# Patient Record
Sex: Female | Born: 1975 | Race: White | Hispanic: No | Marital: Married | Smoking: Former smoker
Health system: Southern US, Community
[De-identification: ages and names within clinical notes are randomized; demographics above are authoritative.]

## PROBLEM LIST (undated history)

## (undated) DIAGNOSIS — Z954 Presence of other heart-valve replacement: Secondary | ICD-10-CM

## (undated) DIAGNOSIS — R112 Nausea with vomiting, unspecified: Secondary | ICD-10-CM

## (undated) DIAGNOSIS — F32A Depression, unspecified: Secondary | ICD-10-CM

## (undated) DIAGNOSIS — I071 Rheumatic tricuspid insufficiency: Secondary | ICD-10-CM

## (undated) DIAGNOSIS — Z87442 Personal history of urinary calculi: Secondary | ICD-10-CM

## (undated) DIAGNOSIS — I509 Heart failure, unspecified: Secondary | ICD-10-CM

## (undated) DIAGNOSIS — R0600 Dyspnea, unspecified: Secondary | ICD-10-CM

## (undated) DIAGNOSIS — I5032 Chronic diastolic (congestive) heart failure: Secondary | ICD-10-CM

## (undated) DIAGNOSIS — D649 Anemia, unspecified: Secondary | ICD-10-CM

## (undated) DIAGNOSIS — Z9889 Other specified postprocedural states: Secondary | ICD-10-CM

## (undated) DIAGNOSIS — F329 Major depressive disorder, single episode, unspecified: Secondary | ICD-10-CM

## (undated) DIAGNOSIS — R072 Precordial pain: Secondary | ICD-10-CM

## (undated) DIAGNOSIS — I052 Rheumatic mitral stenosis with insufficiency: Secondary | ICD-10-CM

## (undated) DIAGNOSIS — J810 Acute pulmonary edema: Secondary | ICD-10-CM

## (undated) DIAGNOSIS — F410 Panic disorder [episodic paroxysmal anxiety] without agoraphobia: Secondary | ICD-10-CM

## (undated) DIAGNOSIS — I499 Cardiac arrhythmia, unspecified: Secondary | ICD-10-CM

## (undated) HISTORY — DX: Precordial pain: R07.2

## (undated) HISTORY — DX: Rheumatic tricuspid insufficiency: I07.1

## (undated) HISTORY — PX: SPLENIC ARTERY EMBOLIZATION: SHX2430

## (undated) HISTORY — DX: Anemia, unspecified: D64.9

## (undated) HISTORY — DX: Chronic diastolic (congestive) heart failure: I50.32

## (undated) HISTORY — DX: Panic disorder (episodic paroxysmal anxiety): F41.0

## (undated) HISTORY — PX: TONSILLECTOMY: SUR1361

## (undated) HISTORY — DX: Acute pulmonary edema: J81.0

## (undated) HISTORY — DX: Rheumatic mitral stenosis with insufficiency: I05.2

## (undated) HISTORY — PX: APPENDECTOMY: SHX54

---

## 2002-08-23 ENCOUNTER — Encounter: Payer: Self-pay | Admitting: Internal Medicine

## 2013-06-28 HISTORY — PX: MULTIPLE TOOTH EXTRACTIONS: SHX2053

## 2014-05-22 DIAGNOSIS — N200 Calculus of kidney: Secondary | ICD-10-CM | POA: Insufficient documentation

## 2014-05-22 DIAGNOSIS — R300 Dysuria: Secondary | ICD-10-CM | POA: Insufficient documentation

## 2014-05-22 DIAGNOSIS — R109 Unspecified abdominal pain: Secondary | ICD-10-CM | POA: Insufficient documentation

## 2014-06-28 HISTORY — PX: OTHER SURGICAL HISTORY: SHX169

## 2017-10-21 ENCOUNTER — Encounter: Payer: Self-pay | Admitting: *Deleted

## 2017-10-24 ENCOUNTER — Encounter: Payer: Self-pay | Admitting: Cardiology

## 2017-10-24 ENCOUNTER — Ambulatory Visit (INDEPENDENT_AMBULATORY_CARE_PROVIDER_SITE_OTHER): Payer: Medicaid Other | Admitting: Cardiology

## 2017-10-24 VITALS — BP 112/77 | HR 69 | Ht 68.0 in | Wt 237.2 lb

## 2017-10-24 DIAGNOSIS — I501 Left ventricular failure: Secondary | ICD-10-CM

## 2017-10-24 DIAGNOSIS — R0602 Shortness of breath: Secondary | ICD-10-CM | POA: Diagnosis not present

## 2017-10-24 DIAGNOSIS — I050 Rheumatic mitral stenosis: Secondary | ICD-10-CM

## 2017-10-24 MED ORDER — FUROSEMIDE 40 MG PO TABS: 60.0000 mg | ORAL_TABLET | Freq: Every day | ORAL | 3 refills | Status: DC

## 2017-10-24 NOTE — Patient Instructions (Addendum)
Medication Instructions:  Your physician has recommended you make the following change in your medication:    INCREASE Lasix to 60 mg daily   Please continue all other medications as prescribed  Labwork:  BMET, MAG  In 1 week  Orders given today   Testing/Procedures: NONE  Follow-Up: Your physician recommends that you schedule a follow-up appointment PENDING RESULTS   Any Other Special Instructions Will Be Listed Below (If Applicable).  If you need a refill on your cardiac medications before your next appointment, please call your pharmacy.

## 2017-10-24 NOTE — Progress Notes (Addendum)
Clinical Summary Ms. Manwarren is a 42 y.o.female seen as new consult, referred by Dr Neita Carp for shortness of breath and pulmonary edema.   1. Pulmonary edema/SOB - admitted 08/2017 to [facility] with SOB.  - CT PE negative, noted to have pulmonary edema - 08/2017 echo [facility]: LVEF 60-65%, mild LVH, mod LAE, Rheumatic MV appearance, severe mitral stenosis   - repeat ER visit with SOB. Given IV lasix in ER - recurrent SOB over the last few days. +orthopnea x 1 month - can have some abdominal distension, LE edema at times - home weights variable.   2. Mitral stenosis - 08/2017 echo [facility]: rheumatic MV described, severe MS. Mean gradient 15. MVA by PHT 1.6. HR 84 during study.  - recent symptoms as reported above   3. Pulmonary nodule - followed by pcp    Past Medical History:  Diagnosis Date  . Acute pulmonary edema (HCC)   . Panic disorder without agoraphobia   . Precordial pain   . Rheumatic mitral stenosis      No Known Allergies   Current Outpatient Medications  Medication Sig Dispense Refill  . albuterol (PROAIR HFA) 108 (90 Base) MCG/ACT inhaler Inhale into the lungs every 6 (six) hours as needed for wheezing or shortness of breath.    . ALPRAZolam (XANAX) 0.5 MG tablet Take 0.5 mg by mouth 4 (four) times daily as needed for anxiety.     . fluticasone (FLONASE) 50 MCG/ACT nasal spray Place into both nostrils daily.    . sertraline (ZOLOFT) 100 MG tablet Take 200 mg by mouth daily.     . traZODone (DESYREL) 100 MG tablet Take 200 mg by mouth at bedtime.     . furosemide (LASIX) 40 MG tablet Take 1.5 tablets (60 mg total) by mouth daily. 45 tablet 3   No current facility-administered medications for this visit.         No Known Allergies    Family History  Problem Relation Age of Onset  . Cancer Father   . Dementia Maternal Grandmother   . Heart disease Maternal Grandfather        had open heart problems  . Cancer Paternal Grandmother   .  Cancer Paternal Grandfather      Social History Ms. Brickhouse reports that she quit smoking about 5 weeks ago. Her smoking use included cigarettes. She started smoking about 25 years ago. She has a 13.00 pack-year smoking history. She has never used smokeless tobacco. Ms. Talwar has no alcohol history on file.   Review of Systems CONSTITUTIONAL: No weight loss, fever, chills, weakness or fatigue.  HEENT: Eyes: No visual loss, blurred vision, double vision or yellow sclerae.No hearing loss, sneezing, congestion, runny nose or sore throat.  SKIN: No rash or itching.  CARDIOVASCULAR: per hpi RESPIRATORY: per hpi GASTROINTESTINAL: No anorexia, nausea, vomiting or diarrhea. No abdominal pain or blood.  GENITOURINARY: No burning on urination, no polyuria NEUROLOGICAL: No headache, dizziness, syncope, paralysis, ataxia, numbness or tingling in the extremities. No change in bowel or bladder control.  MUSCULOSKELETAL: No muscle, back pain, joint pain or stiffness.  LYMPHATICS: No enlarged nodes. No history of splenectomy.  PSYCHIATRIC: No history of depression or anxiety.  ENDOCRINOLOGIC: No reports of sweating, cold or heat intolerance. No polyuria or polydipsia.  Marland Kitchen   Physical Examination Vitals:   10/24/17 0825 10/24/17 0833  BP: 109/74 112/77  Pulse: 65 69  SpO2: 100% 100%   Filed Weights  10/24/17 0825  Weight: 237 lb 3.2 oz (107.6 kg)    Gen: resting comfortably, no acute distress HEENT: no scleral icterus, pupils equal round and reactive, no palptable cervical adenopathy,  CV:RRR, 2/6 diastolic and systolic murmur apex, no jvd Resp: Clear to auscultation bilaterally GI: abdomen is soft, non-tender, non-distended, normal bowel sounds, no hepatosplenomegaly MSK: extremities are warm, no edema.  Skin: warm, no rash Neuro:  no focal deficits Psych: appropriate affect      Assessment and Plan  1. Mitral stenosis/Pulmonary edema/SOB - echo done at [facility]. Disc  obtained, images reviewed by me independtly. Unfortunately the Doppler CW and PW spectral waveforms are not included and I cannot measure gradients directly. There is clear rheumatic changes to the MV with turbulent flow across the valve, minimal calcfication. At least mild MR, if not more. Reported mean gradient is 15.  - probable severe MS, she is significantly symptomatic - plan for TEE to further evaluate valve, to further evaluate her MS but also  quantify her MR which may affect her candidacy for valvuloplasty. Will arrange RHC/LHC as well. - increase lasix to 60mg  daily, check BMET/Mg I 1 week  - pending further workup results, consider with referally to CT surgery for MVR vs interventional cardiology for MV valvuloplasty.  F/u pending test results   Extended clinical time due to review of extensive hospital records including clinic notes, imagaing studies, labs, echo report, and individual review of echo images.    11/01/17 Addendum Called patient today, discussed TEE and cath in details. Of note patient with mirena birth control, I have not ordered a urine prengancy test.   I have reviewed the risks, indications, and alternatives to cardiac catheterization, possible angioplasty, and stenting with the patient  today. Risks include but are not limited to bleeding, infection, vascular injury, stroke, myocardial infection, arrhythmia, kidney injury, radiation-related injury in the case of prolonged fluoroscopy use, emergency cardiac surgery, and death. The patient understands the risks of serious complication is 1-2 in 1000 with diagnostic cardiac cath and 1-2% or less with angioplasty/stenting.   Antoine Poche, M.D

## 2017-10-29 ENCOUNTER — Encounter: Payer: Self-pay | Admitting: Cardiology

## 2017-10-31 ENCOUNTER — Telehealth: Payer: Self-pay | Admitting: *Deleted

## 2017-10-31 NOTE — Telephone Encounter (Signed)
-----   Message from Antoine Poche, MD sent at 10/27/2017 11:41 AM EDT ----- Please let patient know we received the echo disc from [facility]. We need to obtain more information about her heart and heart valve to decide next step Please arrange a TEE and a RHC/LHC all to be done same day at [facility] for mitral stenosis.   Dina Rich MD

## 2017-10-31 NOTE — Telephone Encounter (Signed)
Scheduled TEE for 8am 5/9 left and right heart cath scheduled for 1:30pm - pt had labs done today at [facility] (will request)  Case # for TEE [id number] - pt voiced understanding of instructions.

## 2017-11-01 ENCOUNTER — Other Ambulatory Visit: Payer: Self-pay | Admitting: Cardiology

## 2017-11-01 DIAGNOSIS — I050 Rheumatic mitral stenosis: Secondary | ICD-10-CM

## 2017-11-02 ENCOUNTER — Encounter: Payer: Self-pay | Admitting: *Deleted

## 2017-11-03 ENCOUNTER — Encounter (HOSPITAL_COMMUNITY): Payer: Self-pay | Admitting: *Deleted

## 2017-11-03 ENCOUNTER — Ambulatory Visit (HOSPITAL_COMMUNITY)
Admission: RE | Admit: 2017-11-03 | Discharge: 2017-11-03 | Disposition: A | Discharge status: Home / Self Care | Payer: Medicaid Other | Source: Ambulatory Visit | Attending: Cardiology | Admitting: Cardiology

## 2017-11-03 ENCOUNTER — Encounter (HOSPITAL_COMMUNITY)
Admission: RE | Disposition: A | Discharge status: Home / Self Care | Payer: Self-pay | Source: Ambulatory Visit | Attending: Cardiology

## 2017-11-03 ENCOUNTER — Ambulatory Visit (HOSPITAL_BASED_OUTPATIENT_CLINIC_OR_DEPARTMENT_OTHER)
Admission: RE | Admit: 2017-11-03 | Discharge: 2017-11-03 | Disposition: A | Discharge status: Home / Self Care | Payer: Medicaid Other | Source: Ambulatory Visit | Attending: Cardiology | Admitting: Cardiology

## 2017-11-03 DIAGNOSIS — J811 Chronic pulmonary edema: Secondary | ICD-10-CM | POA: Diagnosis not present

## 2017-11-03 DIAGNOSIS — Z7951 Long term (current) use of inhaled steroids: Secondary | ICD-10-CM | POA: Diagnosis not present

## 2017-11-03 DIAGNOSIS — F410 Panic disorder [episodic paroxysmal anxiety] without agoraphobia: Secondary | ICD-10-CM | POA: Insufficient documentation

## 2017-11-03 DIAGNOSIS — Z87891 Personal history of nicotine dependence: Secondary | ICD-10-CM | POA: Diagnosis not present

## 2017-11-03 DIAGNOSIS — R911 Solitary pulmonary nodule: Secondary | ICD-10-CM | POA: Diagnosis not present

## 2017-11-03 DIAGNOSIS — I340 Nonrheumatic mitral (valve) insufficiency: Secondary | ICD-10-CM

## 2017-11-03 DIAGNOSIS — I050 Rheumatic mitral stenosis: Secondary | ICD-10-CM | POA: Diagnosis not present

## 2017-11-03 DIAGNOSIS — I052 Rheumatic mitral stenosis with insufficiency: Secondary | ICD-10-CM

## 2017-11-03 HISTORY — PX: CARDIAC CATHETERIZATION: SHX172

## 2017-11-03 HISTORY — PX: TEE WITHOUT CARDIOVERSION: SHX5443

## 2017-11-03 HISTORY — PX: RIGHT AND LEFT HEART CATH: CATH118262

## 2017-11-03 LAB — POCT I-STAT 3, ART BLOOD GAS (G3+)
Acid-base deficit: 6 mmol/L — ABNORMAL HIGH (ref 0.0–2.0)
Bicarbonate: 18.1 mmol/L — ABNORMAL LOW (ref 20.0–28.0)
O2 SAT: 98 %
PCO2 ART: 30.8 mmHg — AB (ref 32.0–48.0)
PH ART: 7.379 (ref 7.350–7.450)
PO2 ART: 100 mmHg (ref 83.0–108.0)
TCO2: 19 mmol/L — ABNORMAL LOW (ref 22–32)

## 2017-11-03 LAB — BASIC METABOLIC PANEL
Anion gap: 6 (ref 5–15)
BUN: 16 mg/dL (ref 6–20)
CHLORIDE: 111 mmol/L (ref 101–111)
CO2: 24 mmol/L (ref 22–32)
Calcium: 8.7 mg/dL — ABNORMAL LOW (ref 8.9–10.3)
Creatinine, Ser: 0.97 mg/dL (ref 0.44–1.00)
GFR calc Af Amer: >60 mL/min (ref >60)
GFR calc non Af Amer: >60 mL/min (ref >60)
GLUCOSE: 99 mg/dL (ref 65–99)
POTASSIUM: 4.6 mmol/L (ref 3.5–5.1)
Sodium: 141 mmol/L (ref 135–145)

## 2017-11-03 LAB — CBC
HEMATOCRIT: 35.6 % — AB (ref 36.0–46.0)
Hemoglobin: 11.3 g/dL — ABNORMAL LOW (ref 12.0–15.0)
MCH: 27.1 pg (ref 26.0–34.0)
MCHC: 31.7 g/dL (ref 30.0–36.0)
MCV: 85.4 fL (ref 78.0–100.0)
Platelets: 162 10*3/uL (ref 150–400)
RBC: 4.17 MIL/uL (ref 3.87–5.11)
RDW: 14.1 % (ref 11.5–15.5)
WBC: 4.7 10*3/uL (ref 4.0–10.5)

## 2017-11-03 LAB — POCT I-STAT 3, VENOUS BLOOD GAS (G3P V)
Acid-base deficit: 5 mmol/L — ABNORMAL HIGH (ref 0.0–2.0)
BICARBONATE: 20 mmol/L (ref 20.0–28.0)
O2 Saturation: 70 %
PH VEN: 7.359 (ref 7.250–7.430)
PO2 VEN: 38 mmHg (ref 32.0–45.0)
TCO2: 21 mmol/L — AB (ref 22–32)
pCO2, Ven: 35.4 mmHg — ABNORMAL LOW (ref 44.0–60.0)

## 2017-11-03 LAB — PROTIME-INR
INR: 1.05
Prothrombin Time: 13.6 seconds (ref 11.4–15.2)

## 2017-11-03 LAB — PREGNANCY, URINE: PREG TEST UR: NEGATIVE

## 2017-11-03 SURGERY — RIGHT AND LEFT HEART CATH

## 2017-11-03 SURGERY — ECHOCARDIOGRAM, TRANSESOPHAGEAL
Anesthesia: Moderate Sedation

## 2017-11-03 MED ORDER — FENTANYL CITRATE (PF) 100 MCG/2ML IJ SOLN
INTRAMUSCULAR | Status: DC | PRN
  Administered 2017-11-03 (×4): 25 ug via INTRAVENOUS

## 2017-11-03 MED ORDER — FENTANYL CITRATE (PF) 100 MCG/2ML IJ SOLN
INTRAMUSCULAR | Status: DC | PRN
  Administered 2017-11-03: 50 ug via INTRAVENOUS

## 2017-11-03 MED ORDER — SODIUM CHLORIDE 0.9 % IV SOLN: 250.0000 mL | INTRAVENOUS | Status: DC | PRN

## 2017-11-03 MED ORDER — SODIUM CHLORIDE 0.9 % WEIGHT BASED INFUSION: 1.0000 mL/kg/h | INTRAVENOUS | Status: DC

## 2017-11-03 MED ORDER — HEPARIN (PORCINE) IN NACL 1000-0.9 UT/500ML-% IV SOLN
INTRAVENOUS | Status: AC
  Filled 2017-11-03: qty 1000

## 2017-11-03 MED ORDER — VERAPAMIL HCL 2.5 MG/ML IV SOLN
INTRAVENOUS | Status: DC | PRN
  Administered 2017-11-03: 10 mL via INTRA_ARTERIAL

## 2017-11-03 MED ORDER — DIPHENHYDRAMINE HCL 50 MG/ML IJ SOLN
INTRAMUSCULAR | Status: AC
  Filled 2017-11-03: qty 1

## 2017-11-03 MED ORDER — FENTANYL CITRATE (PF) 100 MCG/2ML IJ SOLN
INTRAMUSCULAR | Status: AC
  Filled 2017-11-03: qty 2

## 2017-11-03 MED ORDER — LIDOCAINE HCL (PF) 1 % IJ SOLN
INTRAMUSCULAR | Status: DC | PRN
  Administered 2017-11-03 (×2): 2 mL

## 2017-11-03 MED ORDER — MIDAZOLAM HCL 2 MG/2ML IJ SOLN
INTRAMUSCULAR | Status: AC
  Filled 2017-11-03: qty 2

## 2017-11-03 MED ORDER — SODIUM CHLORIDE 0.9% FLUSH: 3.0000 mL | INTRAVENOUS | Status: DC | PRN

## 2017-11-03 MED ORDER — IODIXANOL 320 MG/ML IV SOLN
INTRAVENOUS | Status: DC | PRN
  Administered 2017-11-03: 70 mL via INTRAVENOUS

## 2017-11-03 MED ORDER — HEPARIN (PORCINE) IN NACL 2-0.9 UNITS/ML
INTRAMUSCULAR | Status: AC | PRN
  Administered 2017-11-03 (×2): 500 mL

## 2017-11-03 MED ORDER — ACETAMINOPHEN 325 MG PO TABS: 650.0000 mg | ORAL_TABLET | ORAL | Status: DC | PRN

## 2017-11-03 MED ORDER — VERAPAMIL HCL 2.5 MG/ML IV SOLN
INTRAVENOUS | Status: AC
  Filled 2017-11-03: qty 2

## 2017-11-03 MED ORDER — HEPARIN SODIUM (PORCINE) 1000 UNIT/ML IJ SOLN
INTRAMUSCULAR | Status: DC | PRN
  Administered 2017-11-03: 5000 [IU] via INTRAVENOUS

## 2017-11-03 MED ORDER — SODIUM CHLORIDE 0.9 % WEIGHT BASED INFUSION: 3.0000 mL/kg/h | INTRAVENOUS | Status: AC

## 2017-11-03 MED ORDER — DIPHENHYDRAMINE HCL 50 MG/ML IJ SOLN
INTRAMUSCULAR | Status: DC | PRN
  Administered 2017-11-03: 25 mg via INTRAVENOUS

## 2017-11-03 MED ORDER — HEPARIN SODIUM (PORCINE) 1000 UNIT/ML IJ SOLN
INTRAMUSCULAR | Status: AC
  Filled 2017-11-03: qty 1

## 2017-11-03 MED ORDER — SODIUM CHLORIDE 0.9% FLUSH: 3.0000 mL | Freq: Two times a day (BID) | INTRAVENOUS | Status: DC

## 2017-11-03 MED ORDER — ONDANSETRON HCL 4 MG/2ML IJ SOLN: 4.0000 mg | Freq: Four times a day (QID) | INTRAMUSCULAR | Status: DC | PRN

## 2017-11-03 MED ORDER — SODIUM CHLORIDE 0.9 % IV SOLN: INTRAVENOUS | Status: DC

## 2017-11-03 MED ORDER — ASPIRIN 81 MG PO CHEW
81.0000 mg | CHEWABLE_TABLET | ORAL | Status: AC
  Administered 2017-11-03: 81 mg via ORAL
  Filled 2017-11-03: qty 1

## 2017-11-03 MED ORDER — BUTAMBEN-TETRACAINE-BENZOCAINE 2-2-14 % EX AERO
INHALATION_SPRAY | CUTANEOUS | Status: DC | PRN
  Administered 2017-11-03: 2 via TOPICAL

## 2017-11-03 MED ORDER — SODIUM CHLORIDE 0.9 % IV SOLN: INTRAVENOUS | Status: AC

## 2017-11-03 MED ORDER — MIDAZOLAM HCL 2 MG/2ML IJ SOLN
INTRAMUSCULAR | Status: DC | PRN
  Administered 2017-11-03 (×2): 1 mg via INTRAVENOUS

## 2017-11-03 MED ORDER — MIDAZOLAM HCL 5 MG/ML IJ SOLN
INTRAMUSCULAR | Status: AC
  Filled 2017-11-03: qty 2

## 2017-11-03 MED ORDER — LIDOCAINE HCL (PF) 1 % IJ SOLN
INTRAMUSCULAR | Status: AC
  Filled 2017-11-03: qty 30

## 2017-11-03 MED ORDER — MIDAZOLAM HCL 10 MG/2ML IJ SOLN
INTRAMUSCULAR | Status: DC | PRN
  Administered 2017-11-03 (×4): 2 mg via INTRAVENOUS

## 2017-11-03 SURGICAL SUPPLY — 15 items
CATH BALLN WEDGE 5F 110CM (CATHETERS) ×2 IMPLANT
CATH INFINITI 5 FR JL3.5 (CATHETERS) ×2 IMPLANT
CATH INFINITI JR4 5F (CATHETERS) ×2 IMPLANT
DEVICE RAD COMP TR BAND LRG (VASCULAR PRODUCTS) ×2 IMPLANT
GUIDEWIRE INQWIRE 1.5J.035X260 (WIRE) IMPLANT
INQWIRE 1.5J .035X260CM (WIRE) ×3
KIT HEART LEFT (KITS) ×3 IMPLANT
NDL PERC 21GX4CM (NEEDLE) IMPLANT
NEEDLE PERC 21GX4CM (NEEDLE) ×3 IMPLANT
PACK CARDIAC CATHETERIZATION (CUSTOM PROCEDURE TRAY) ×3 IMPLANT
SHEATH AVANTI 11CM 5FR (SHEATH) IMPLANT
SHEATH RAIN 4/5FR (SHEATH) ×2 IMPLANT
SHEATH RAIN RADIAL 21G 6FR (SHEATH) ×2 IMPLANT
TRANSDUCER W/STOPCOCK (MISCELLANEOUS) ×3 IMPLANT
TUBING CIL FLEX 10 FLL-RA (TUBING) ×3 IMPLANT

## 2017-11-03 NOTE — Discharge Instructions (Signed)

## 2017-11-03 NOTE — Interval H&P Note (Signed)
History and Physical Interval Note:  11/03/2017 7:49 AM  Gloria James  has presented today for surgery, with the diagnosis of MITRAL STENOSIS  The various methods of treatment have been discussed with the patient and family. After consideration of risks, benefits and other options for treatment, the patient has consented to  Procedure(s): TRANSESOPHAGEAL ECHOCARDIOGRAM (TEE) (N/A) as a surgical intervention .  The patient's history has been reviewed, patient examined, no change in status, stable for surgery.  I have reviewed the patient's chart and labs.  Questions were answered to the patient's satisfaction.     Olga Millers

## 2017-11-03 NOTE — CV Procedure (Addendum)
    Transesophageal Echocardiogram Note  Gloria James [id number] [birth year]  Procedure: Transesophageal Echocardiogram Indications: Mitral stenosis/mitral regurgitation  Procedure Details Consent: Obtained Time Out: Verified patient identification, verified procedure, site/side was marked, verified correct patient position, special equipment/implants available, Radiology Safety Procedures followed,  medications/allergies/relevent history reviewed, required imaging and test results available.  Performed  Medications:  During this procedure the patient is administered a total of Versed 8 mg, benadryl 25 mg and Fentanyl 100 mcg  to achieve and maintain moderate conscious sedation.  The patient's heart rate, blood pressure, and oxygen saturation are monitored continuously during the procedure. The period of conscious sedation is 40 minutes, of which I was present face-to-face 100% of this time.  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   Complications: No apparent complications Patient did tolerate procedure well.  Olga Millers, MD

## 2017-11-03 NOTE — Progress Notes (Signed)
  Echocardiogram Echocardiogram Transesophageal has been performed.  Gloria James 11/03/2017, 9:12 AM

## 2017-11-03 NOTE — Interval H&P Note (Signed)
History and Physical Interval Note:  11/03/2017 1:42 PM  Gloria James  has presented today for cardiac cath with the diagnosis of severe mitral stenosis and mitral regurgitation. The various methods of treatment have been discussed with the patient and family. After consideration of risks, benefits and other options for treatment, the patient has consented to  Procedure(s): RIGHT/LEFT HEART CATH AND CORONARY ANGIOGRAPHY (N/A) as a surgical intervention .  The patient's history has been reviewed, patient examined, no change in status, stable for surgery.  I have reviewed the patient's chart and labs.  Questions were answered to the patient's satisfaction.    Cath Lab Visit (complete for each Cath Lab visit)  Clinical Evaluation Leading to the Procedure:   ACS: No.  Non-ACS:    Anginal Classification: CCS II  Anti-ischemic medical therapy: No Therapy  Non-Invasive Test Results: No non-invasive testing performed  Prior CABG: No previous CABG         Verne Carrow

## 2017-11-03 NOTE — H&P (Signed)
Office Visit   10/24/2017 [facility]    Antoine Poche, MD  Cardiology   Severe mitral valve stenosis +2 more  Dx   Referred by Estanislado Pandy, MD  Reason for Visit   Additional Documentation   Vitals:   BP 112/77 (BP Location: Right Arm, Cuff Size: Large)   Pulse 69   Ht 5\' 8"  (1.727 m)   Wt 237 lb 3.2 oz (107.6 kg)   SpO2 100%   BMI 36.07 kg/m   BSA 2.27 m      More Vitals   Flowsheets:   Anthropometrics,   Peripheral Arterial Disease Screen,   MEWS Score,   Extended Vitals     Encounter Info:   Billing Info,   History,   Allergies,   Detailed Report     All Notes   Progress Notes by Antoine Poche, MD at 10/24/2017 8:40 AM  Author: Antoine Poche, MD Author Type: Physician Filed: 11/01/2017 3:21 PM  Note Status: Addendum Cosign: Cosign Not Required Encounter Date: 10/24/2017  Editor: Antoine Poche, MD (Physician)  Prior Versions: 1. Antoine Poche, MD (Physician) at 10/29/2017 2:50 PM - Signed           Clinical Summary Ms. Kempa is a 42 y.o.female seen as new consult, referred by Dr Neita Carp for shortness of breath and pulmonary edema.   1. Pulmonary edema/SOB - admitted 08/2017 to [facility] with SOB.  - CT PE negative, noted to have pulmonary edema - 08/2017 echo [facility]: LVEF 60-65%, mild LVH, mod LAE, Rheumatic MV appearance, severe mitral stenosis   - repeat ER visit with SOB. Given IV lasix in ER - recurrent SOB over the last few days. +orthopnea x 1 month - can have some abdominal distension, LE edema at times - home weights variable.   2. Mitral stenosis - 08/2017 echo [facility]: rheumatic MV described, severe MS. Mean gradient 15. MVA by PHT 1.6. HR 84 during study.  - recent symptoms as reported above   3. Pulmonary nodule - followed by pcp        Past Medical History:  Diagnosis Date  . Acute pulmonary edema (HCC)   . Panic disorder without agoraphobia   . Precordial  pain   . Rheumatic mitral stenosis      No Known Allergies         Current Outpatient Medications  Medication Sig Dispense Refill  . albuterol (PROAIR HFA) 108 (90 Base) MCG/ACT inhaler Inhale into the lungs every 6 (six) hours as needed for wheezing or shortness of breath.    . ALPRAZolam (XANAX) 0.5 MG tablet Take 0.5 mg by mouth 4 (four) times daily as needed for anxiety.     . fluticasone (FLONASE) 50 MCG/ACT nasal spray Place into both nostrils daily.    . sertraline (ZOLOFT) 100 MG tablet Take 200 mg by mouth daily.     . traZODone (DESYREL) 100 MG tablet Take 200 mg by mouth at bedtime.     . furosemide (LASIX) 40 MG tablet Take 1.5 tablets (60 mg total) by mouth daily. 45 tablet 3   No current facility-administered medications for this visit.         No Known Allergies         Family History  Problem Relation Age of Onset  . Cancer Father   . Dementia Maternal Grandmother   . Heart disease Maternal Grandfather  had open heart problems  . Cancer Paternal Grandmother   . Cancer Paternal Grandfather      Social History Ms. Labate reports that she quit smoking about 5 weeks ago. Her smoking use included cigarettes. She started smoking about 25 years ago. She has a 13.00 pack-year smoking history. She has never used smokeless tobacco. Ms. Beauchamp has no alcohol history on file.   Review of Systems CONSTITUTIONAL: No weight loss, fever, chills, weakness or fatigue.  HEENT: Eyes: No visual loss, blurred vision, double vision or yellow sclerae.No hearing loss, sneezing, congestion, runny nose or sore throat.  SKIN: No rash or itching.  CARDIOVASCULAR: per hpi RESPIRATORY: per hpi GASTROINTESTINAL: No anorexia, nausea, vomiting or diarrhea. No abdominal pain or blood.  GENITOURINARY: No burning on urination, no polyuria NEUROLOGICAL: No headache, dizziness, syncope, paralysis, ataxia, numbness or tingling in the  extremities. No change in bowel or bladder control.  MUSCULOSKELETAL: No muscle, back pain, joint pain or stiffness.  LYMPHATICS: No enlarged nodes. No history of splenectomy.  PSYCHIATRIC: No history of depression or anxiety.  ENDOCRINOLOGIC: No reports of sweating, cold or heat intolerance. No polyuria or polydipsia.  Marland Kitchen   Physical Examination     Vitals:   10/24/17 0825 10/24/17 0833  BP: 109/74 112/77  Pulse: 65 69  SpO2: 100% 100%      Filed Weights   10/24/17 0825  Weight: 237 lb 3.2 oz (107.6 kg)    Gen: resting comfortably, no acute distress HEENT: no scleral icterus, pupils equal round and reactive, no palptable cervical adenopathy,  CV:RRR, 2/6 diastolic and systolic murmur apex, no jvd Resp: Clear to auscultation bilaterally GI: abdomen is soft, non-tender, non-distended, normal bowel sounds, no hepatosplenomegaly MSK: extremities are warm, no edema.  Skin: warm, no rash Neuro:  no focal deficits Psych: appropriate affect      Assessment and Plan  1. Mitral stenosis/Pulmonary edema/SOB - echo done at [facility]. Disc obtained, images reviewed by me independtly. Unfortunately the Doppler CW and PW spectral waveforms are not included and I cannot measure gradients directly. There is clear rheumatic changes to the MV with turbulent flow across the valve, minimal calcfication. At least mild MR, if not more. Reported mean gradient is 15.  - probable severe MS, she is significantly symptomatic - plan for TEE to further evaluate valve, to further evaluate her MS but also  quantify her MR which may affect her candidacy for valvuloplasty. Will arrange RHC/LHC as well. - increase lasix to 60mg  daily, check BMET/Mg I 1 week  - pending further workup results, consider with referally to CT surgery for MVR vs interventional cardiology for MV valvuloplasty.  F/u pending test results   Extended clinical time due to review of extensive hospital records  including clinic notes, imagaing studies, labs, echo report, and individual review of echo images.    11/01/17 Addendum Called patient today, discussed TEE and cath in details. Of note patient with mirena birth control, I have not ordered a urine prengancy test.   I have reviewed the risks, indications, and alternatives to cardiac catheterization, possible angioplasty, and stenting with the patient  today. Risks include but are not limited to bleeding, infection, vascular injury, stroke, myocardial infection, arrhythmia, kidney injury, radiation-related injury in the case of prolonged fluoroscopy use, emergency cardiac surgery, and death. The patient understands the risks of serious complication is 1-2 in 1000 with diagnostic cardiac cath and 1-2% or less with angioplasty/stenting.   Antoine Poche, M.D  For TEE to  assess MS/MR; no changes. Olga Millers, MD

## 2017-11-04 ENCOUNTER — Encounter (HOSPITAL_COMMUNITY): Payer: Self-pay | Admitting: Cardiovascular Disease

## 2017-11-04 NOTE — Addendum Note (Signed)
Encounter addended by: Tonye Royalty, RN on: 11/04/2017 4:03 PM  Actions taken: Order Reconciliation Section accessed, Visit Navigator Flowsheet section accepted

## 2017-11-07 ENCOUNTER — Telehealth: Payer: Self-pay | Admitting: *Deleted

## 2017-11-07 DIAGNOSIS — I052 Rheumatic mitral stenosis with insufficiency: Secondary | ICD-10-CM

## 2017-11-07 NOTE — Telephone Encounter (Signed)
Pt aware and agreeable to referral - orders place and will forward to schedulers  

## 2017-11-07 NOTE — Telephone Encounter (Signed)
-----   Message from Antoine Poche, MD sent at 11/03/2017  6:34 PM EDT ----- Can we refer this patient to Dr Barry Dienes CT surgery for mitral stenosis/regurgitation   Dominga Ferry MD

## 2017-11-08 MED FILL — Heparin Sod (Porcine)-NaCl IV Soln 1000 Unit/500ML-0.9%: INTRAVENOUS | Qty: 1000 | Status: AC

## 2017-11-15 ENCOUNTER — Encounter: Payer: Medicaid Other | Admitting: Thoracic Surgery (Cardiothoracic Vascular Surgery)

## 2017-11-17 ENCOUNTER — Other Ambulatory Visit: Payer: Self-pay

## 2017-11-17 ENCOUNTER — Institutional Professional Consult (permissible substitution): Payer: Medicaid Other | Admitting: Thoracic Surgery (Cardiothoracic Vascular Surgery)

## 2017-11-17 ENCOUNTER — Encounter: Payer: Self-pay | Admitting: Thoracic Surgery (Cardiothoracic Vascular Surgery)

## 2017-11-17 VITALS — BP 102/73 | HR 76 | Resp 18 | Ht 68.0 in | Wt 229.0 lb

## 2017-11-17 DIAGNOSIS — I071 Rheumatic tricuspid insufficiency: Secondary | ICD-10-CM

## 2017-11-17 DIAGNOSIS — I050 Rheumatic mitral stenosis: Secondary | ICD-10-CM | POA: Diagnosis not present

## 2017-11-17 NOTE — Progress Notes (Signed)
[facility]  MULTIDISCIPLINARY HEART VALVE CLINIC  CARDIOTHORACIC SURGERY CONSULTATION REPORT  Referring Provider is [name], MD  Primary Cardiologist is Branch, Dorothe Pea, MD PCP is Sasser, Clarene Critchley, MD  Chief Complaint  Patient presents with  . 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  The patient has been reasonably healthy for most of her life.  She has a long history of tobacco abuse as well as chronic anxiety and panic disorder.  She describes a long history of mild exertional shortness of breath which she attributed to her tobacco use and obesity.  She developed rapidly progressive symptoms of exertional shortness of breath over the last few months.  She developed orthopnea, lower extremity edema, and intermittent resting shortness of breath.  She began to experience episodes of PND and subsequently presented to [facility] in [place] with likely acute exacerbation of chronic diastolic congestive heart failure and pulmonary edema.  CT angiogram of the chest was negative for pulmonary embolus.  An echocardiogram performed at that time revealed normal left ventricular systolic function with rheumatic appearing mitral valve and severe mitral stenosis.  Symptoms improved with diuretic therapy.  Patient was referred for outpatient cardiology consultation and evaluated by Dr. [name] in early April.  She subsequently underwent transesophageal echocardiogram and diagnostic cardiac catheterization on Nov 03, 2017.  TEE confirmed the presence of rheumatic appearing mitral valve with moderate mitral stenosis and severe mitral regurgitation.  Diagnostic cardiac catheterization revealed normal coronary arteries with mild pulmonary hypertension and large V  waves on wedge tracing consistent with severe mitral regurgitation.  The patient was referred for elective surgical consultation.  Patient is married  And lives with her husband and 3 children in [place], [place].  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  She reports no significant physical limitations although she admits that she does not exercise on a regular basis.  She has experienced decreased energy, loss of appetite, and worsening exertional shortness of breath over several months with acute exacerbation in March as noted previously.  Since hospital discharge he has been doing better on oral Lasix therapy although she reports that she still gets short of breath with moderate level activity.  She has not had chest pain or chest tightness.  She has had some dizzy spells without syncope.  She has occasional palpitations without any known history of irregular heart rhythms.  Past Medical History:  Diagnosis Date  . Acute pulmonary edema (HCC)   . Panic disorder without agoraphobia   . Precordial pain   . Rheumatic mitral stenosis with regurgitation   . Tricuspid regurgitation     Past Surgical History:  Procedure Laterality Date  . APPENDECTOMY     age 31  . RIGHT AND LEFT HEART CATH N/A 11/03/2017   Procedure: RIGHT AND LEFT HEART CATH;  Surgeon: Kathleene Hazel, MD;  Location: [facility] INVASIVE CV LAB;  Service: Cardiovascular;  Laterality: N/A;  . TEE WITHOUT CARDIOVERSION N/A 11/03/2017   Procedure: TRANSESOPHAGEAL ECHOCARDIOGRAM (TEE);  Surgeon: Lewayne Bunting, MD;  Location: [facility] ENDOSCOPY;  Service: Cardiovascular;  Laterality: N/A;  . TONSILLECTOMY     5    Family History  Problem Relation Age of Onset  .  Cancer Father   . Dementia Maternal Grandmother   . Heart disease Maternal Grandfather        had open heart problems  . Cancer Paternal Grandmother   . Cancer Paternal Grandfather     Social History    Socioeconomic History  . Marital status: Married    Spouse name: Not on file  . Number of children: Not on file  . Years of education: Not on file  . Highest education level: Not on file  Occupational History  . Not on file  Social Needs  . Financial resource strain: Not on file  . Food insecurity:    Worry: Not on file    Inability: Not on file  . Transportation needs:    Medical: Not on file    Non-medical: Not on file  Tobacco Use  . Smoking status: Former Smoker    Packs/day: 0.50    Years: 26.00    Pack years: 13.00    Types: Cigarettes    Start date: 12/18/1991    Last attempt to quit: 09/23/2017    Years since quitting: 0.1  . Smokeless tobacco: Never Used  Substance and Sexual Activity  . Alcohol use: Not on file  . Drug use: Not on file  . Sexual activity: Not on file  Lifestyle  . Physical activity:    Days per week: Not on file    Minutes per session: Not on file  . Stress: Not on file  Relationships  . Social connections:    Talks on phone: Not on file    Gets together: Not on file    Attends religious service: Not on file    Active member of club or organization: Not on file    Attends meetings of clubs or organizations: Not on file    Relationship status: Not on file  . Intimate partner violence:    Fear of current or ex partner: Not on file    Emotionally abused: Not on file    Physically abused: Not on file    Forced sexual activity: Not on file  Other Topics Concern  . Not on file  Social History Narrative  . Not on file    Current Outpatient Medications  Medication Sig Dispense Refill  . albuterol (PROAIR HFA) 108 (90 Base) MCG/ACT inhaler Inhale 2 puffs into the lungs every 4 (four) hours as needed for wheezing or shortness of breath.     . ALPRAZolam (XANAX) 0.5 MG tablet Take 0.5 mg by mouth 4 (four) times daily as needed for anxiety.     . Aspirin-Acetaminophen-Caffeine (GOODY HEADACHE PO) Take 1 packet by mouth daily as needed (for  headache).    . fluticasone (FLONASE) 50 MCG/ACT nasal spray Place 2 sprays into both nostrils daily as needed for allergies.     . furosemide (LASIX) 40 MG tablet Take 1.5 tablets (60 mg total) by mouth daily. 45 tablet 3  . sertraline (ZOLOFT) 100 MG tablet Take 200 mg by mouth daily.     . traZODone (DESYREL) 100 MG tablet Take 200 mg by mouth at bedtime.      No current facility-administered medications for this visit.     No Known Allergies    Review of Systems:   General:  decreased appetite, decreased energy, + weight gain, no weight loss, no fever  Cardiac:  no chest pain with exertion, no chest pain at rest, + SOB with exertion, + resting SOB, + PND, + orthopnea, + palpitations, no  arrhythmia, no atrial fibrillation, + LE edema, + dizzy spells, no syncope  Respiratory:  + shortness of breath, no home oxygen, no productive cough, + dry cough, no bronchitis, no wheezing, no hemoptysis, no asthma, no pain with inspiration or cough, no sleep apnea, no CPAP at night  GI:   no difficulty swallowing, + reflux, + frequent heartburn, no hiatal hernia, no abdominal pain, no constipation, + diarrhea, no hematochezia, no hematemesis, no melena  GU:   no dysuria,  + frequency, no urinary tract infection, no hematuria, no kidney stones, no kidney disease  Vascular:  no pain suggestive of claudication, no pain in feet, + leg cramps, no varicose veins, no DVT, no non-healing foot ulcer  Neuro:   no stroke, no TIA's, no seizures, no headaches, no temporary blindness one eye,  no slurred speech, no peripheral neuropathy, no chronic pain, no instability of gait, no memory/cognitive dysfunction  Musculoskeletal: no arthritis, no joint swelling, no myalgias, no difficulty walking, normal mobility   Skin:   no rash, no itching, no skin infections, no pressure sores or ulcerations  Psych:   + anxiety, no depression, no nervousness, no unusual recent stress  Eyes:   no blurry vision, no floaters, no recent  vision changes, no wears glasses or contacts  ENT:   no hearing loss, no loose or painful teeth, no dentures, last saw dentist > 1 year ago   Hematologic:  no easy bruising, no abnormal bleeding, no clotting disorder, no frequent epistaxis  Endocrine:  no diabetes, does not check CBG's at home           Physical Exam:   BP 102/73 (BP Location: Right Arm, Patient Position: Sitting, Cuff Size: Normal)   Pulse 76   Resp 18   Ht 5\' 8"  (1.727 m)   Wt 229 lb (103.9 kg)   SpO2 99% Comment: RA  BMI 34.82 kg/m   General:  Obese,  well-appearing  HEENT:  Unremarkable   Neck:   no JVD, no bruits, no adenopathy   Chest:   clear to auscultation, symmetrical breath sounds, no wheezes, no rhonchi   CV:   RRR, grade III/VI crescendo/decrescendo murmur heard best at LSB,  no diastolic murmur  Abdomen:  soft, non-tender, no masses   Extremities:  warm, well-perfused, pulses diminished but palpable, no LE edema  Rectal/GU  Deferred  Neuro:   Grossly non-focal and symmetrical throughout  Skin:   Clean and dry, no rashes, no breakdown   Diagnostic Tests:  Transesophageal Echocardiography  Patient:    Ronniesha, Seibold MR #:       [id number] Study Date: 11/03/2017 Gender:     F Age:        41 Height:     172.7 cm Weight:     106.6 kg BSA:        2.3 m^2 Pt. Status: Room:   ADMITTING    Olga Millers  PERFORMING   Olga Millers  ATTENDING    Patrick Jupiter, M.D.  Lisette Abu, M.D.  REFERRING    Patrick Jupiter, M.D.  SONOGRAPHER  Sheralyn Boatman  cc:  ------------------------------------------------------------------- LV EF: 55% -   60%  ------------------------------------------------------------------- Indications:      424.0 Mitral valve disease. Severe valvular disease  ------------------------------------------------------------------- History:   PMH:  Mitral regurgitation. Pulmonary edema.   Mitral stenosis.  ------------------------------------------------------------------- Study Conclusions  - Left ventricle: Systolic function was normal. The estimated   ejection fraction was in the  range of 55% to 60%. Wall motion was   normal; there were no regional wall motion abnormalities. - Aortic valve: There was trivial regurgitation. - Mitral valve: Mild thickening, consistent with rheumatic disease.   Mobility of the posterior leaflet was severely restricted. The   findings are consistent with moderate stenosis. There was severe   regurgitation. Valve area by pressure half-time: 1.76 cm^2. - Left atrium: The atrium was severely dilated. No evidence of   thrombus in the atrial cavity or appendage. - Atrial septum: No defect or patent foramen ovale was identified. - Tricuspid valve: No evidence of vegetation. There was   mild-moderate regurgitation. - Pulmonic valve: No evidence of vegetation.  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  ------------------------------------------------------------------- Study data:   Study status:  Routine.  Consent:  The risks, benefits, and alternatives to the procedure were explained to the patient and informed consent was obtained.  Procedure:  The patient reported no pain pre or post test. Initial setup. The patient was brought to the laboratory. Surface ECG leads were monitored. Sedation. Conscious sedation was administered by cardiology staff. Transesophageal echocardiography. Topical anesthesia was obtained using viscous lidocaine. An adult multiplane transesophageal probe was inserted by the  attending cardiologistwithout difficulty. 3D image quality was excellent.  Study completion:  The patient tolerated the procedure well. There were no complications. Administered medications:   Fentanyl, , IV.  Midazolam, 8mg , IV.  Diphenhydramine, 25mg , IV.          Diagnostic transesophageal echocardiography.  2D and color Doppler.  Birthdate:  Patient birthdate: Jan 09, 1976.  Age:  Patient is 42 yr old.  Sex:  Gender: female.    BMI: 35.7 kg/m^2.  Blood pressure:     113/63  Patient status:  Outpatient.  Study date:  Study date: 11/03/2017. Study time: 07:57 AM.  Location:  Endoscopy.  -------------------------------------------------------------------  ------------------------------------------------------------------- Left ventricle:  Systolic function was normal. The estimated ejection fraction was in the range of 55% to 60%. Wall motion was normal; there were no regional wall motion abnormalities.  ------------------------------------------------------------------- Aortic valve:   Trileaflet; mildly thickened leaflets. Cusp separation was normal.  Doppler:  There was trivial regurgitation.   ------------------------------------------------------------------- Aorta:  Aortic root: The aortic root had mild diffuse disease.  ------------------------------------------------------------------- Mitral valve:   Mild thickening, consistent with rheumatic disease. Mobility of the posterior leaflet was severely restricted. Doppler:   The findings are consistent with moderate stenosis. There was severe regurgitation.    Valve area by pressure half-time: 1.76 cm^2. Indexed valve area by pressure half-time: 0.76 cm^2/m^2.    Mean gradient (D): 15 mm Hg.  ------------------------------------------------------------------- Left atrium:  The atrium was severely dilated.  No evidence of thrombus in the atrial cavity or  appendage.  ------------------------------------------------------------------- Atrial septum:  No defect or patent foramen ovale was identified.   ------------------------------------------------------------------- Right ventricle:  Systolic function was normal.  ------------------------------------------------------------------- Pulmonic valve:    Structurally normal valve.   Cusp separation was normal.  No evidence of vegetation.  ------------------------------------------------------------------- Tricuspid valve:   Structurally normal valve.   Leaflet separation was normal.  No evidence of vegetation.  Doppler:  There was mild-moderate regurgitation.  ------------------------------------------------------------------- Right atrium:  The atrium was normal in size.  ------------------------------------------------------------------- Pericardium:  There was no pericardial effusion.  ------------------------------------------------------------------- Measurements   Mitral valve                        Value  Mitral mean velocity, D             188   cm/s  Mitral pressure half-time           125   ms  Mitral mean gradient, D             15    mm Hg  Mitral valve area, PHT, DP          1.76  cm^2  Mitral valve area/bsa, PHT, DP      0.76  cm^2/m^2  Mitral annulus VTI, D               97    cm  Mitral regurg VTI, PISA             172   cm  Mitral ERO, PISA                    0.84  cm^2  Mitral regurg volume, PISA          144   ml  Legend: (L)  and  (H)  mark values outside specified reference range.  ------------------------------------------------------------------- Prepared and Electronically Authenticated by  Olga Millers 2019-05-09T14:28:35    RIGHT AND LEFT HEART CATH  Conclusion   1. NO angiographic evidence of CAD 2. Large v-wave on wedge tracing c/w severe MR  Recommendations: Continue workup for surgical MV replacement/repair. I do not think  she will be a candidate for balloon valvuloplasty given her severe MR.   Indications   Mitral valve stenosis, unspecified etiology [I05.0 (ICD-10-CM)]  Procedural Details/Technique   Technical Details Indication: 42 yo female with h/o tobacco abuse, severe MS/MR.   Procedure: The risks, benefits, complications, treatment options, and expected outcomes were discussed with the patient. The patient and/or family concurred with the proposed plan, giving informed consent. The patient was brought to the cath lab after IV hydration was given. The patient was further sedated with Versed and Fentanyl. There was an IV catheter present in the right antecubital vein. I prepped this area and changed the catheter out for a 5 French sheath. Right heart cath performed with a balloon tipped catheter. The right wrist was prepped and draped in a sterile fashion. 1% lidocaine was used for local anesthesia. Using the modified Seldinger access technique, a 5 French sheath was placed in the right radial artery. 3 mg Verapamil was given through the sheath. 5000 units IV heparin was given. Standard diagnostic catheters were used to perform selective coronary angiography. I did not cross the aortic valve due to a density noted on the leaflet during TEE. The sheath was removed from the right radial artery and a Terumo hemostasis band was applied at the arteriotomy site on the right wrist.     Estimated blood loss <50 mL.  During this procedure the patient was administered the following to achieve and maintain moderate conscious sedation: Versed 2 mg, Fentanyl 50 mcg, while the patient's heart rate, blood pressure, and oxygen saturation were continuously monitored. The period of conscious sedation was 27 minutes, of which I was present face-to-face 100% of this time.  Complications   Complications documented before study signed (11/03/2017 2:52 PM EDT)    RIGHT  AND LEFT HEART CATH   None Documented by Kathleene Hazel, MD 11/03/2017 2:38 PM EDT  Time Range: Intraprocedure      Coronary Findings   Diagnostic  Dominance: Right  Left Anterior Descending  Vessel is large.  First Diagonal Branch  Vessel is large in size.  Left Circumflex  Second Obtuse Marginal Branch  Vessel is moderate in size.  Right Coronary Artery  Vessel is large.  Intervention   No interventions have been documented.  Right Heart   Right Heart Pressures Hemodynamic findings consistent with mild pulmonary hypertension.  Coronary Diagrams   Diagnostic Diagram       Implants    No implant documentation for this case.  MERGE Images   Show images for CARDIAC CATHETERIZATION   Link to Procedure Log   Procedure Log    Hemo Data    Most Recent Value  Fick Cardiac Output 6.77 L/min  Fick Cardiac Output Index 3.09 (L/min)/BSA  RA A Wave 17 mmHg  RA V Wave 13 mmHg  RA Mean 12 mmHg  RV Systolic Pressure 44 mmHg  RV Diastolic Pressure 6 mmHg  RV EDP 12 mmHg  PA Systolic Pressure 47 mmHg  PA Diastolic Pressure 22 mmHg  PA Mean 33 mmHg  PW A Wave 28 mmHg  PW V Wave 48 mmHg  PW Mean 30 mmHg  AO Systolic Pressure 92 mmHg  AO Diastolic Pressure 62 mmHg  AO Mean 77 mmHg  QP/QS 1  TPVR Index 10.68 HRUI  TSVR Index 24.91 HRUI  PVR SVR Ratio 0.05  TPVR/TSVR Ratio 0.43       Impression:  I have personally reviewed the patient's recent transesophageal echocardiogram and diagnostic cardiac catheterization.  She has findings consistent with rheumatic mitral valve disease with stage D severe symptomatic mitral regurgitation and moderate mitral stenosis.  Mitral valve has classical features of rheumatic disease with leaflet thickening, restricted leaflet mobility, and some foreshortening of the subvalvular apparatus.  The posterior leaflet is very severely restricted and nearly fixed.  There is a broad jet of regurgitation with flow reversal in the pulmonary veins.  There is moderate left atrial enlargement.  Left  ventricular size and function appear normal.  The aortic valve appears normal.  Right ventricular size and function is normal.  The tricuspid annulus does not appear to be dilated.  There is mild tricuspid regurgitation.  Diagnostic cardiac catheterization revealed normal coronary artery anatomy with no significant coronary artery disease.  There was mild pulmonary hypertension and large V waves consistent with severe mitral regurgitation.  I agree the patient needs mitral valve repair or replacement.  Based upon review of the patient's transesophageal echocardiogram I feel that the likelihood of durable valve repair is probably relatively low.  Patient appears to be an acceptable candidate for minimally invasive approach.  The patient does appear to have the possibility of ongoing dental problems and needs dental consultation prior to surgery.   Plan:  The patient was counseled at length regarding the indications, risks and potential benefits of mitral valve repair or replacement.  The rationale for elective surgery has been explained, including a comparison between surgery and continued medical therapy with close follow-up.  The likelihood of successful and durable valve repair has been discussed with particular reference to the findings of their recent echocardiogram.  Based upon these findings and previous experience, I have quoted them a less than 50 percent likelihood of successful valve repair.  In the unlikely event that their valve  cannot be successfully repaired, we discussed the possibility of replacing the mitral valve using a mechanical prosthesis with the attendant need for long-term anticoagulation versus the alternative of replacing it using a bioprosthetic tissue valve with its potential for late structural valve deterioration and failure, depending upon the patient's longevity.  The patient specifically requests that if the mitral valve must be replaced that it be done using a mechanical  valve.   Alternative surgical approaches have been discussed including a comparison between conventional sternotomy and minimally-invasive techniques.  The relative risks and benefits of each have been reviewed as they pertain to the patient's specific circumstances, and all of their questions have been addressed.  Expectations for the patient's postoperative convalescence have been discussed.  All of their questions have been answered.  We tentatively plan to proceed with surgery on December 21, 2017.  Patient will be referred for dental service consultation.  She will also undergo CT angiography to evaluate the feasibility of peripheral cannulation for surgery.   I spent in excess of 90 minutes during the conduct of this office consultation and >50% of this time involved direct face-to-face encounter with the patient for counseling and/or coordination of their care.    Salvatore Decent. Cornelius Moras, MD 11/17/2017 6:16 PM

## 2017-11-17 NOTE — Patient Instructions (Signed)
Do not take Goody's powders or any other products containing aspirin.  Continue all other previous medications without any changes at this time

## 2017-11-18 ENCOUNTER — Other Ambulatory Visit: Payer: Self-pay | Admitting: *Deleted

## 2017-11-18 DIAGNOSIS — Z01818 Encounter for other preprocedural examination: Secondary | ICD-10-CM

## 2017-11-18 DIAGNOSIS — I712 Thoracic aortic aneurysm, without rupture, unspecified: Secondary | ICD-10-CM

## 2017-11-18 DIAGNOSIS — I340 Nonrheumatic mitral (valve) insufficiency: Secondary | ICD-10-CM

## 2017-11-18 DIAGNOSIS — I7409 Other arterial embolism and thrombosis of abdominal aorta: Secondary | ICD-10-CM

## 2017-11-18 DIAGNOSIS — I050 Rheumatic mitral stenosis: Secondary | ICD-10-CM

## 2017-12-02 ENCOUNTER — Encounter (HOSPITAL_COMMUNITY): Payer: Self-pay | Admitting: Dentistry

## 2017-12-02 ENCOUNTER — Ambulatory Visit (HOSPITAL_COMMUNITY): Payer: Medicaid Other | Admitting: Dentistry

## 2017-12-02 VITALS — BP 113/72 | HR 67 | Temp 98.3°F

## 2017-12-02 DIAGNOSIS — K029 Dental caries, unspecified: Secondary | ICD-10-CM

## 2017-12-02 DIAGNOSIS — K0530 Chronic periodontitis, unspecified: Secondary | ICD-10-CM

## 2017-12-02 DIAGNOSIS — F40232 Fear of other medical care: Secondary | ICD-10-CM

## 2017-12-02 DIAGNOSIS — M264 Malocclusion, unspecified: Secondary | ICD-10-CM

## 2017-12-02 DIAGNOSIS — K0889 Other specified disorders of teeth and supporting structures: Secondary | ICD-10-CM | POA: Diagnosis not present

## 2017-12-02 DIAGNOSIS — I052 Rheumatic mitral stenosis with insufficiency: Secondary | ICD-10-CM

## 2017-12-02 DIAGNOSIS — R682 Dry mouth, unspecified: Secondary | ICD-10-CM

## 2017-12-02 DIAGNOSIS — K045 Chronic apical periodontitis: Secondary | ICD-10-CM

## 2017-12-02 DIAGNOSIS — K117 Disturbances of salivary secretion: Secondary | ICD-10-CM

## 2017-12-02 DIAGNOSIS — K08409 Partial loss of teeth, unspecified cause, unspecified class: Secondary | ICD-10-CM

## 2017-12-02 DIAGNOSIS — K08199 Complete loss of teeth due to other specified cause, unspecified class: Secondary | ICD-10-CM

## 2017-12-02 DIAGNOSIS — K06010 Localized gingival recession, unspecified: Secondary | ICD-10-CM

## 2017-12-02 DIAGNOSIS — Z01818 Encounter for other preprocedural examination: Secondary | ICD-10-CM

## 2017-12-02 DIAGNOSIS — K036 Deposits [accretions] on teeth: Secondary | ICD-10-CM

## 2017-12-02 NOTE — Pre-Procedure Instructions (Signed)
[name]  12/02/2017      [facility] - [place], [place] - [address] [place] [address] Phone: [phone] Fax: [phone]  [facility] - [place], [place] - [address] [place] [address] Phone: [phone] Fax: [phone]    Your procedure is scheduled on June 11  Report to [facility] Admitting at 5:30 A.M.  Call this number if you have problems the morning of surgery:  [phone]   Remember:  No food or liquids after midnight.                         Take these medicines the morning of surgery with A SIP OF WATER : albuterol inhaler if needed-bring to hospital, alprazolam (xanax),flonase nasal spray if needed,sertraline (zoloft)                    7 days prior to surgery STOP taking any Aspirin(unless otherwise instructed by your surgeon), Aleve, Naproxen, Ibuprofen, Motrin, Advil, Goody's, BC's, all herbal medications, fish oil, and all vitamins    Do not wear jewelry, make-up or nail polish.  Do not wear lotions, powders, or perfumes, or deodorant.  Do not shave 48 hours prior to surgery.  Men may shave face and neck.  Do not bring valuables to the hospital.  [facility] is not responsible for any belongings or valuables.  Contacts, dentures or bridgework may not be worn into surgery.  Leave your suitcase in the car.  After surgery it may be brought to your room.  For patients admitted to the hospital, discharge time will be determined by your treatment team.  Patients discharged the day of surgery will not be allowed to drive home.    Special instructions:   [place] [place]- Preparing For Surgery  Before surgery, you can play an important role. Because skin is not sterile, your skin needs to be as free of germs as possible. You can reduce the number of germs on your skin by washing with CHG (chlorahexidine gluconate) Soap before surgery.  CHG is an antiseptic cleaner which  kills germs and bonds with the skin to continue killing germs even after washing.    Oral Hygiene is also important to reduce your risk of infection.  Remember - BRUSH YOUR TEETH THE MORNING OF SURGERY WITH YOUR REGULAR TOOTHPASTE  Please do not use if you have an allergy to CHG or antibacterial soaps. If your skin becomes reddened/irritated stop using the CHG.  Do not shave (including legs and underarms) for at least 48 hours prior to first CHG shower. It is OK to shave your face.  Please follow these instructions carefully.   1. Shower the NIGHT BEFORE SURGERY and the MORNING OF SURGERY with CHG.   2. If you chose to wash your hair, wash your hair first as usual with your normal shampoo.  3. After you shampoo, rinse your hair and body thoroughly to remove the shampoo.  4. Use CHG as you would any other liquid soap. You can apply CHG directly to the skin and wash gently with a scrungie or a clean washcloth.   5. Apply the CHG Soap to your body ONLY FROM THE NECK DOWN.  Do not use on open wounds or open sores. Avoid contact with your eyes, ears, mouth and genitals (private parts). Wash Face and  genitals (private parts)  with your normal soap.  6. Wash thoroughly, paying special attention to the area where your surgery will be performed.  7. Thoroughly rinse your body with warm water from the neck down.  8. DO NOT shower/wash with your normal soap after using and rinsing off the CHG Soap.  9. Pat yourself dry with a CLEAN TOWEL.  10. Wear CLEAN PAJAMAS to bed the night before surgery, wear comfortable clothes the morning of surgery  11. Place CLEAN SHEETS on your bed the night of your first shower and DO NOT SLEEP WITH PETS.    Day of Surgery:  Do not apply any deodorants/lotions.  Please wear clean clothes to the hospital/surgery center.   Remember to brush your teeth WITH YOUR REGULAR TOOTHPASTE.   Please read over the following fact sheets that you were given. Coughing and  Deep Breathing and Surgical Site Infection Prevention

## 2017-12-02 NOTE — Progress Notes (Signed)
DENTAL CONSULTATION  Date of Consultation:  12/02/2017 Patient Name:   Gloria James Date of Birth:   12/15/1975 Medical Record Number: [id number]  VITALS: BP 113/72   Pulse 67   Temp 98.3 F (36.8 C)   CHIEF COMPLAINT: Patient was referred by Dr. Cornelius Moras for dental consultation.  HPI: Gloria James is a 42 year old female recently diagnosed with rheumatic mitral stenosis with regurgitation. Patient with anticipated mitral valve replacement repair with Dr. Cornelius Moras in the future. Patient is now seen as part of a medically necessary pre-heart valve surgery dental protocol examination.  Patient currently denies acute toothaches, swellings, or abscesses. Patient was last seen 2-3 years ago for multiple dental restorations with Dr. Jasmine Awe in [place], [place]. Patient also had 10 extractions performed by Dr. Barbette Merino, oral surgeon, with no complications by patient report. Patient did not have any partial dentures fabricated. 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  PROBLEM LIST: Patient Active Problem List   Diagnosis Date Noted  . Tricuspid regurgitation   . Rheumatic mitral stenosis with regurgitation     PMH: Past Medical History:  Diagnosis Date  . Acute pulmonary edema (HCC)   . Panic disorder without agoraphobia   . Precordial pain   . Rheumatic mitral stenosis with regurgitation   . Tricuspid regurgitation     PSH: Past Surgical History:  Procedure Laterality Date  . APPENDECTOMY     age 25  . RIGHT AND LEFT HEART CATH N/A 11/03/2017   Procedure: RIGHT AND LEFT HEART CATH;  Surgeon: Kathleene Hazel, MD;  [facility];  Service: Cardiovascular;  Laterality: N/A;  . TEE WITHOUT CARDIOVERSION N/A 11/03/2017   Procedure: TRANSESOPHAGEAL ECHOCARDIOGRAM (TEE);  Surgeon: Lewayne Bunting, MD;  Location: [facility] ENDOSCOPY;  Service: Cardiovascular;  Laterality: N/A;  . TONSILLECTOMY     6    ALLERGIES: No Known  Allergies  MEDICATIONS: Current Outpatient Medications  Medication Sig Dispense Refill  . albuterol (PROAIR HFA) 108 (90 Base) MCG/ACT inhaler Inhale 2 puffs into the lungs every 4 (four) hours as needed for wheezing or shortness of breath.     . ALPRAZolam (XANAX) 0.5 MG tablet Take 0.5 mg by mouth 4 (four) times daily as needed for anxiety.     . fluticasone (FLONASE) 50 MCG/ACT nasal spray Place 2 sprays into both nostrils daily as needed for allergies.     . furosemide (LASIX) 40 MG tablet Take 1.5 tablets (60 mg total) by mouth daily. 45 tablet 3  . sertraline (ZOLOFT) 100 MG tablet Take 200 mg by mouth daily.     . traZODone (DESYREL) 100 MG tablet Take 200 mg by mouth at bedtime.      No current facility-administered medications for this visit.     LABS: Lab Results  Component Value Date   WBC 4.7 11/03/2017   HGB 11.3 (L) 11/03/2017   HCT 35.6 (L) 11/03/2017   MCV 85.4 11/03/2017   PLT 162 11/03/2017      Component Value Date/Time   NA 141 11/03/2017 0705   K 4.6 11/03/2017 0705   CL 111 11/03/2017 0705   CO2 24 11/03/2017 0705   GLUCOSE 99 11/03/2017 0705   BUN 16 11/03/2017 0705   CREATININE 0.97 11/03/2017 0705   CALCIUM 8.7 (L) 11/03/2017 0705   GFRNONAA >60 11/03/2017 0705   GFRAA >60 11/03/2017 0705   Lab Results  Component Value Date   INR 1.05 11/03/2017  No results found for: PTT  SOCIAL HISTORY: Social History   Socioeconomic History  . Marital status: Married    Spouse name: Not on file  . Number of children: Not on file  . Years of education: Not on file  . Highest education level: Not on file  Occupational History  . Not on file  Social Needs  . Financial resource strain: Not on file  . Food insecurity:    Worry: Not on file    Inability: Not on file  . Transportation needs:    Medical: Not on file    Non-medical: Not on file  Tobacco Use  . Smoking status: Former Smoker    Packs/day: 0.50    Years: 26.00    Pack years: 13.00     Types: Cigarettes    Start date: 12/18/1991    Last attempt to quit: 09/23/2017    Years since quitting: 0.1  . Smokeless tobacco: Never Used  Substance and Sexual Activity  . Alcohol use: Not on file  . Drug use: Not on file  . Sexual activity: Not on file  Lifestyle  . Physical activity:    Days per week: Not on file    Minutes per session: Not on file  . Stress: Not on file  Relationships  . Social connections:    Talks on phone: Not on file    Gets together: Not on file    Attends religious service: Not on file    Active member of club or organization: Not on file    Attends meetings of clubs or organizations: Not on file    Relationship status: Not on file  . Intimate partner violence:    Fear of current or ex partner: Not on file    Emotionally abused: Not on file    Physically abused: Not on file    Forced sexual activity: Not on file  Other Topics Concern  . Not on file  Social History Narrative  . Not on file    FAMILY HISTORY: Family History  Problem Relation Age of Onset  . Cancer Father   . Dementia Maternal Grandmother   . Heart disease Maternal Grandfather        had open heart problems  . Cancer Paternal Grandmother   . Cancer Paternal Grandfather     REVIEW OF SYSTEMS: Reviewed with the patient as per History of present illness. 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  DENTAL HISTORY: CHIEF COMPLAINT: Patient was referred by Dr. Cornelius Moras for dental consultation.  HPI: Gloria James is a 42 year old female recently diagnosed with rheumatic mitral stenosis with regurgitation. Patient with anticipated mitral valve replacement repair with Dr. Cornelius Moras in the future. Patient is now seen as part of a medically necessary pre-heart valve surgery dental protocol examination.  Patient currently denies acute toothaches, swellings, or abscesses. Patient was last seen 2-3 years ago for multiple dental restorations with  Dr. Jasmine Awe in [place], [place]. Patient also had 10 extractions performed by Dr. Barbette Merino, oral surgeon, with no complications by patient report. Patient did not have any partial dentures fabricated. 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  DENTAL EXAMINATION: GENERAL:  The patient is a well-developed, well-nourished female in no acute distress. HEAD AND NECK:  There is no palpable neck lymphadenopathy. The patient denies acute TMJ symptoms. INTRAORAL EXAM: The patient has xerostomia. There is no evidence of oral abscess formation  at this time. DENTITION:  The patient is missing tooth numbers 1 through 3, 7, 13 through 16, 17-19, 21, and 30 and 31. PERIODONTAL:  Patient has chronic periodontitis with plaque and calculus accumulations, gingival recession, and incipient tooth mobility. There is incipient to moderate bone loss noted. DENTAL CARIES/SUBOPTIMAL RESTORATIONS:  Patient has rampant dental caries as per dental charting form. Many of the dental caries are close to impinging on the pulp. ENDODONTIC:  The patient currently denies acute pulpitis symptoms. Patient does have multiple areas of periapical pathology and radiolucency associated with tooth numbers 23, 25, and 27.  Patient does have previous root canal therapy associated with tooth #22. CROWN AND BRIDGE:  There is a PFM crown restoration on tooth #22. PROSTHODONTIC:  The patient denies having partial dentures. OCCLUSION:  Patient has a poor occlusal scheme secondary to multiple missing teeth, supra-eruption and drifting of the unopposed teeth into the edentulous areas, and lack of replacement of missing teeth with dental prostheses.  RADIOGRAPHIC INTERPRETATION: An orthopantogram was taken and supplemented with 12 periapical radiographs and 2 bitewings. There are multiple missing teeth. There is supra-eruption and drifting of the unopposed teeth into the edentulous areas. Multiple dental  caries are noted. There is periapical pathology and radiolucency associated with the apices of tooth numbers 23, 25, 27.  Many of the dental caries are close to impinging on the pulp. There is a previous root canal therapy associated with tooth #22.  Multiple amalgam, resin, and crown restorations are noted.  ASSESSMENTS: 1. Rheumatic mitral stenosis with regurgitation 2. Pre-heart valve surgery dental protocol 3. Chronic apical periodontitis  4. Rampant dental caries 5. Xerostomia 6. Chronic periodontitis of bone loss 7. Gingival recession 8. Tooth mobility 9. Accretions 10. Multiple missing teeth 11. Supra-eruption and drifting of the unopposed teeth into the edentulous areas.12. Poor occlusal scheme and malocclusion   PLAN/RECOMMENDATIONS: 1. I discussed the risks, benefits, and complications of various treatment options with the patient in relationship to her medical and dental conditions, anticipated heart valve surgery, and risk for endocarditis. We discussed various treatment options to include no treatment, total and subtotal extractions with alveoloplasty, pre-prosthetic surgery as indicated, periodontal therapy, dental restorations, root canal therapy, crown and bridge therapy, implant therapy, and replacement of missing teeth as indicated. The patient currently wishes to proceed with extraction of remaining teeth with alveoloplasty in the operating room with general anesthesia. This has been scheduled for 12/06/2017 at 7:30 AM at [facility].  Patient scheduled for presurgical testing appointment on Monday, 6/10/ 2019 at 9 AM. The patient will then follow-up with a dentist of her choice for fabrication of upper and lower complete dentures after adequate healing and once medically stable from the anticipated heart valve surgery with Dr. Cornelius Moras.   2. Discussion of findings with medical team and coordination of future medical and dental care as needed.  I spent in excess of  120  minutes during the conduct of this consultation and >50% of this time involved direct face-to-face encounter for counseling and/or coordination of the patient's care.    Charlynne Pander, DDS

## 2017-12-02 NOTE — Patient Instructions (Signed)
[facility]    Department of Dental Medicine     DR. KULINSKI      HEART VALVES AND MOUTH CARE:  FACTS:   If you have any infection in your mouth, it can infect your heart valve.  If you heart valve is infected, you will be seriously ill.  Infections in the mouth can be SILENT and do not always cause pain.  Examples of infections in the mouth are gum disease, dental cavities, and abscesses.  Some possible signs of infection are: Bad breath, bleeding gums, or teeth that are sensitive to sweets, hot, and/or cold. There are many other signs as well.  WHAT YOU HAVE TO DO:   Brush your teeth after meals and at bedtime. Spend at least 2 minutes brushing well, especially behind your back teeth and all around your teeth that stand alone. Brush at the gumline also.  Do not go to bed without brushing your teeth and flossing.  If you gums bleed when you brush or floss, do NOT stop brushing or flossing. It usually means that your gums need more attention and better cleaning.   If your Dentist or Dr. Kulinski gave you a prescription mouthwash to use, make sure to use it as directed. If you run out of the medication, get a refill at the pharmacy.   If you were given any other medications or directions by your Dentist, please follow them. If you did not understand the directions or forget what you were told, please call. We will be happy to refresh her memory.  If you need antibiotics before dental procedures, make sure you take them one hour prior to every dental visit as directed.   Get a dental checkup every 4-6 months in order to keep your mouth healthy, or to find and treat any new infection. You will most likely need your teeth cleaned or gums treated at the same time.  If you are not able to come in for your scheduled appointment, call your Dentist as soon as possible to reschedule.  If you have a problem in between dental visits, call your Dentist.  

## 2017-12-05 ENCOUNTER — Encounter (HOSPITAL_COMMUNITY): Payer: Self-pay

## 2017-12-05 ENCOUNTER — Encounter (HOSPITAL_COMMUNITY)
Admission: RE | Admit: 2017-12-05 | Discharge: 2017-12-05 | Disposition: A | Discharge status: Home / Self Care | Payer: Medicaid Other | Source: Ambulatory Visit | Attending: Dentistry | Admitting: Dentistry

## 2017-12-05 ENCOUNTER — Other Ambulatory Visit: Payer: Self-pay

## 2017-12-05 DIAGNOSIS — Z7951 Long term (current) use of inhaled steroids: Secondary | ICD-10-CM | POA: Diagnosis not present

## 2017-12-05 DIAGNOSIS — I081 Rheumatic disorders of both mitral and tricuspid valves: Secondary | ICD-10-CM | POA: Diagnosis not present

## 2017-12-05 DIAGNOSIS — Z01812 Encounter for preprocedural laboratory examination: Secondary | ICD-10-CM | POA: Diagnosis not present

## 2017-12-05 DIAGNOSIS — K0530 Chronic periodontitis, unspecified: Secondary | ICD-10-CM | POA: Diagnosis not present

## 2017-12-05 DIAGNOSIS — Z87891 Personal history of nicotine dependence: Secondary | ICD-10-CM | POA: Diagnosis not present

## 2017-12-05 DIAGNOSIS — F410 Panic disorder [episodic paroxysmal anxiety] without agoraphobia: Secondary | ICD-10-CM | POA: Diagnosis not present

## 2017-12-05 DIAGNOSIS — Z01818 Encounter for other preprocedural examination: Secondary | ICD-10-CM | POA: Insufficient documentation

## 2017-12-05 DIAGNOSIS — Z79899 Other long term (current) drug therapy: Secondary | ICD-10-CM | POA: Insufficient documentation

## 2017-12-05 HISTORY — DX: Dyspnea, unspecified: R06.00

## 2017-12-05 HISTORY — DX: Nausea with vomiting, unspecified: R11.2

## 2017-12-05 HISTORY — DX: Heart failure, unspecified: I50.9

## 2017-12-05 HISTORY — DX: Other specified postprocedural states: Z98.890

## 2017-12-05 HISTORY — DX: Personal history of urinary calculi: Z87.442

## 2017-12-05 HISTORY — DX: Depression, unspecified: F32.A

## 2017-12-05 HISTORY — DX: Cardiac arrhythmia, unspecified: I49.9

## 2017-12-05 HISTORY — DX: Other specified postprocedural states: R11.2

## 2017-12-05 HISTORY — DX: Major depressive disorder, single episode, unspecified: F32.9

## 2017-12-05 LAB — CBC
HEMATOCRIT: 39.4 % (ref 36.0–46.0)
Hemoglobin: 12.5 g/dL (ref 12.0–15.0)
MCH: 26.4 pg (ref 26.0–34.0)
MCHC: 31.7 g/dL (ref 30.0–36.0)
MCV: 83.1 fL (ref 78.0–100.0)
Platelets: 227 10*3/uL (ref 150–400)
RBC: 4.74 MIL/uL (ref 3.87–5.11)
RDW: 12.7 % (ref 11.5–15.5)
WBC: 5.5 10*3/uL (ref 4.0–10.5)

## 2017-12-05 LAB — BASIC METABOLIC PANEL
Anion gap: 9 (ref 5–15)
BUN: 11 mg/dL (ref 6–20)
CO2: 26 mmol/L (ref 22–32)
Calcium: 9.1 mg/dL (ref 8.9–10.3)
Chloride: 107 mmol/L (ref 101–111)
Creatinine, Ser: 0.86 mg/dL (ref 0.44–1.00)
GFR calc Af Amer: >60 mL/min (ref >60)
GLUCOSE: 81 mg/dL (ref 65–99)
POTASSIUM: 3.4 mmol/L — AB (ref 3.5–5.1)
Sodium: 142 mmol/L (ref 135–145)

## 2017-12-05 NOTE — Progress Notes (Signed)
Anesthesia PAT Evaluation:   Case:  [id number] Date/Time:  12/06/17 0715   Procedure:  MULTIPLE EXTRACTION WITH ALVEOLOPLASTY (N/A )   Anesthesia type:  General   Pre-op diagnosis:  mitral stenosis and chronic periodontitis   [facility] ROOM 08 / [facility] OR   Surgeon:  Charlynne Pander, DDS      DISCUSSION: Patient is a 42 year old female scheduled for the above procedure. She has known rheumatic mitral valve disease with mini MV repair versus replacement currently scheduled for 12/22/17 by Tressie Stalker, MD. He referred patient for a pre-valve dental consult. According to Dr. Luretha Murphy note, extraction of remaining teeth with alveoloplasty is planned. Anesthesia consult requested to evaluate for "assessability to proceed with general anesthesia with nasoendotracheal tube."  History includes former smoker (quit 09/23/17), rheumatic mitral stenosis with regurgitation, tricuspid regurgitation, panic disorder without agoraphobia. Admission to [facility] 08/2017 with pulmonary edema with echo showing severe MS on TTE (TEE showed moderate MS, severe MR).   Today she feels she is doing well. Weights have been stable. No SOB at rest. Was able to walk from hospital entrance to PAT without difficultly--tries to pace herself with activity. She started sleeping on two pillows prior to her 08/2017 CHF admission, and although feels her fluid status is well controlled on diuretic therapy she continues to use three pillows for comfort. She denied blood thinners, epistaxis, nasal fracture, chronic sinus issues. She has occasional allergies. She feels she can breath equally as well from either nares. She is aware that she is being considered for nasoendotracheal intubation. Further evaluation by her assigned anesthesiologist on the day of surgery.   VS: BP 119/68   Pulse 75   Temp 36.7 C   Resp 18   Ht  (1.727 m)   Wt 231 lb (104.8 kg)   SpO2 100%   BMI 35.12 kg/m  Patient in NAD. No conversational  dyspnea. Heart RRR. Grade II/VI murmur present. Left lung base sounds mildly course initially but cleared with cough. Remaining lung sounds clear. No pitting ankle edema. No carotid bruits noted. Outward nasal exam suggests some nasal septal deviation (columella deviates leftward). (She feels she breaths equally well from either nares.)  PROVIDERS: Sasser, Clarene Critchley, MD is PCP.  Dina Rich, MD is cardiologist.    LABS: Labs reviewed: Acceptable for surgery. (all labs ordered are listed, but only abnormal results are displayed)  Labs Reviewed  BASIC METABOLIC PANEL - Abnormal; Notable for the following components:      Result Value   Potassium 3.4 (*)    All other components within normal limits  CBC    IMAGES: She is scheduled for CTA chest/abd/pelvis, pre-cardiac surgery Dopplers, and PFTs on 12/19/17.    EKG: 10/28/17: SR, low voltage precordial leads. Baseline wander in leads V5-6.    CV: TEE 11/03/17: Study Conclusions - Left ventricle: Systolic function was normal. The estimated   ejection fraction was in the range of 55% to 60%. Wall motion was   normal; there were no regional wall motion abnormalities. - Aortic valve: There was trivial regurgitation. - Mitral valve: Mild thickening, consistent with rheumatic disease.   Mobility of the posterior leaflet was severely restricted. The   findings are consistent with moderate stenosis. There was severe   regurgitation. Valve area by pressure half-time: 1.76 cm^2. - Left atrium: The atrium was severely dilated. No evidence of   thrombus in the atrial cavity or appendage. - Atrial septum: No defect or patent  foramen ovale was identified. - Tricuspid valve: No evidence of vegetation. There was   mild-moderate regurgitation. - Pulmonic valve: No evidence of vegetation. 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  Cardiac cath 11/03/17: 1. No angiographic evidence of CAD 2. Large v-wave on wedge tracing c/w severe MR Recommendations: Continue workup for surgical MV replacement/repair. I do not think she will be a candidate for balloon valvuloplasty given her severe MR.    Past Medical History:  Diagnosis Date  . Acute pulmonary edema (HCC)   . Panic disorder without agoraphobia   . Precordial pain   . Rheumatic mitral stenosis with regurgitation   . Tricuspid regurgitation     Past Surgical History:  Procedure Laterality Date  . APPENDECTOMY     age 62  . RIGHT AND LEFT HEART CATH N/A 11/03/2017   Procedure: RIGHT AND LEFT HEART CATH;  Surgeon: Kathleene Hazel, MD;  [facility] INVAS[facility] LAB;  Service: Cardiovascular;  Laterality: N/A;  . TEE WITHOUT CARDIOVERSION N/A 11/03/2017   Procedure: TRANSESOPHAGEAL ECHOCARDIOGRAM (TEE);  Surgeon: Lewayne Bunting, MD;  Location: [facility] ENDOSCOPY;  Service: Cardiovascular;  Laterality: N/A;  . TONSILLECTOMY     6    MEDICATIONS: . albuterol (PROAIR HFA) 108 (90 Base) MCG/ACT inhaler  . ALPRAZolam (XANAX) 0.5 MG tablet  . fluticasone (FLONASE) 50 MCG/ACT nasal spray  . furosemide (LASIX) 40 MG tablet  . sertraline (ZOLOFT) 100 MG tablet  . traZODone (DESYREL) 100 MG tablet   No current facility-administered medications for this encounter.     Velna [facility] Stay [facility]/Anesthesiology Phone [phone] 12/05/2017 2:55 PM

## 2017-12-05 NOTE — Progress Notes (Signed)
PCP - Dr. Neita Carp Cardiologist - Dr. [name] CT surgeon - Dr. Cornelius Moras  Chest x-ray - n/a EKG - 09/2017 Stress Test - patient denies  ECHO - 10/2017 Cardiac Cath - 10/2017  Sleep Study - patient denies   Blood Thinner Instructions: n/a Aspirin Instructions: n/a  Anesthesia review: per MD order; cardiac history  Patient denies shortness of breath, fever, cough and chest pain at PAT appointment   Patient verbalized understanding of instructions that were given to them at the PAT appointment. Patient was also instructed that they will need to review over the PAT instructions again at home before surgery.

## 2017-12-06 ENCOUNTER — Encounter (HOSPITAL_COMMUNITY)
Admission: RE | Disposition: A | Discharge status: Home / Self Care | Payer: Self-pay | Source: Ambulatory Visit | Attending: Dentistry

## 2017-12-06 ENCOUNTER — Encounter (HOSPITAL_COMMUNITY): Payer: Self-pay | Admitting: *Deleted

## 2017-12-06 ENCOUNTER — Ambulatory Visit (HOSPITAL_COMMUNITY)
Admission: RE | Admit: 2017-12-06 | Discharge: 2017-12-06 | Disposition: A | Discharge status: Home / Self Care | Payer: Medicaid Other | Source: Ambulatory Visit | Attending: Dentistry | Admitting: Dentistry

## 2017-12-06 ENCOUNTER — Ambulatory Visit (HOSPITAL_COMMUNITY): Payer: Medicaid Other | Admitting: Vascular Surgery

## 2017-12-06 ENCOUNTER — Ambulatory Visit (HOSPITAL_COMMUNITY): Payer: Medicaid Other | Admitting: Certified Registered"

## 2017-12-06 DIAGNOSIS — F410 Panic disorder [episodic paroxysmal anxiety] without agoraphobia: Secondary | ICD-10-CM | POA: Diagnosis not present

## 2017-12-06 DIAGNOSIS — Z6835 Body mass index (BMI) 35.0-35.9, adult: Secondary | ICD-10-CM | POA: Diagnosis not present

## 2017-12-06 DIAGNOSIS — I272 Pulmonary hypertension, unspecified: Secondary | ICD-10-CM | POA: Insufficient documentation

## 2017-12-06 DIAGNOSIS — Z8249 Family history of ischemic heart disease and other diseases of the circulatory system: Secondary | ICD-10-CM | POA: Diagnosis not present

## 2017-12-06 DIAGNOSIS — Z87891 Personal history of nicotine dependence: Secondary | ICD-10-CM | POA: Diagnosis not present

## 2017-12-06 DIAGNOSIS — I052 Rheumatic mitral stenosis with insufficiency: Secondary | ICD-10-CM | POA: Diagnosis not present

## 2017-12-06 DIAGNOSIS — K029 Dental caries, unspecified: Secondary | ICD-10-CM | POA: Insufficient documentation

## 2017-12-06 DIAGNOSIS — K0889 Other specified disorders of teeth and supporting structures: Secondary | ICD-10-CM | POA: Insufficient documentation

## 2017-12-06 DIAGNOSIS — F430 Acute stress reaction: Secondary | ICD-10-CM | POA: Insufficient documentation

## 2017-12-06 DIAGNOSIS — Z79899 Other long term (current) drug therapy: Secondary | ICD-10-CM | POA: Diagnosis not present

## 2017-12-06 DIAGNOSIS — F329 Major depressive disorder, single episode, unspecified: Secondary | ICD-10-CM | POA: Diagnosis not present

## 2017-12-06 DIAGNOSIS — R0602 Shortness of breath: Secondary | ICD-10-CM | POA: Insufficient documentation

## 2017-12-06 DIAGNOSIS — F419 Anxiety disorder, unspecified: Secondary | ICD-10-CM | POA: Diagnosis not present

## 2017-12-06 DIAGNOSIS — I081 Rheumatic disorders of both mitral and tricuspid valves: Secondary | ICD-10-CM | POA: Diagnosis not present

## 2017-12-06 DIAGNOSIS — K0530 Chronic periodontitis, unspecified: Secondary | ICD-10-CM

## 2017-12-06 DIAGNOSIS — K045 Chronic apical periodontitis: Secondary | ICD-10-CM | POA: Diagnosis not present

## 2017-12-06 DIAGNOSIS — I5032 Chronic diastolic (congestive) heart failure: Secondary | ICD-10-CM | POA: Insufficient documentation

## 2017-12-06 DIAGNOSIS — Z01818 Encounter for other preprocedural examination: Secondary | ICD-10-CM | POA: Diagnosis not present

## 2017-12-06 HISTORY — PX: MULTIPLE EXTRACTIONS WITH ALVEOLOPLASTY: SHX5342

## 2017-12-06 LAB — POCT PREGNANCY, URINE: PREG TEST UR: NEGATIVE

## 2017-12-06 SURGERY — MULTIPLE EXTRACTION WITH ALVEOLOPLASTY
Anesthesia: General

## 2017-12-06 MED ORDER — OXYMETAZOLINE HCL 0.05 % NA SOLN
NASAL | Status: DC | PRN
  Administered 2017-12-06: 1

## 2017-12-06 MED ORDER — MIDAZOLAM HCL 5 MG/5ML IJ SOLN
INTRAMUSCULAR | Status: DC | PRN
  Administered 2017-12-06: 2 mg via INTRAVENOUS

## 2017-12-06 MED ORDER — LIDOCAINE 2% (20 MG/ML) 5 ML SYRINGE
INTRAMUSCULAR | Status: DC | PRN
  Administered 2017-12-06: 80 mg via INTRAVENOUS

## 2017-12-06 MED ORDER — DEXAMETHASONE SODIUM PHOSPHATE 10 MG/ML IJ SOLN
INTRAMUSCULAR | Status: AC
  Filled 2017-12-06: qty 1

## 2017-12-06 MED ORDER — ONDANSETRON HCL 4 MG/2ML IJ SOLN
INTRAMUSCULAR | Status: DC | PRN
  Administered 2017-12-06 (×2): 4 mg via INTRAVENOUS

## 2017-12-06 MED ORDER — PHENYLEPHRINE 40 MCG/ML (10ML) SYRINGE FOR IV PUSH (FOR BLOOD PRESSURE SUPPORT)
PREFILLED_SYRINGE | INTRAVENOUS | Status: AC
  Filled 2017-12-06: qty 10

## 2017-12-06 MED ORDER — ROCURONIUM BROMIDE 100 MG/10ML IV SOLN
INTRAVENOUS | Status: DC | PRN
  Administered 2017-12-06: 60 mg via INTRAVENOUS
  Administered 2017-12-06: 10 mg via INTRAVENOUS

## 2017-12-06 MED ORDER — SUGAMMADEX SODIUM 200 MG/2ML IV SOLN
INTRAVENOUS | Status: AC
Start: 2017-12-06 — End: ?
  Filled 2017-12-06: qty 2

## 2017-12-06 MED ORDER — FENTANYL CITRATE (PF) 100 MCG/2ML IJ SOLN
25.0000 ug | INTRAMUSCULAR | Status: DC | PRN
  Administered 2017-12-06 (×2): 50 ug via INTRAVENOUS

## 2017-12-06 MED ORDER — CEFAZOLIN SODIUM-DEXTROSE 2-4 GM/100ML-% IV SOLN
2.0000 g | Freq: Once | INTRAVENOUS | Status: AC
  Administered 2017-12-06: 2 g via INTRAVENOUS

## 2017-12-06 MED ORDER — LACTATED RINGERS IV SOLN
INTRAVENOUS | Status: DC | PRN
  Administered 2017-12-06: 07:00:00 via INTRAVENOUS

## 2017-12-06 MED ORDER — LIDOCAINE 2% (20 MG/ML) 5 ML SYRINGE
INTRAMUSCULAR | Status: AC
  Filled 2017-12-06: qty 5

## 2017-12-06 MED ORDER — DEXAMETHASONE SODIUM PHOSPHATE 10 MG/ML IJ SOLN
INTRAMUSCULAR | Status: DC | PRN
  Administered 2017-12-06: 10 mg via INTRAVENOUS

## 2017-12-06 MED ORDER — OXYCODONE-ACETAMINOPHEN 5-325 MG PO TABS: ORAL_TABLET | ORAL | 0 refills | Status: DC

## 2017-12-06 MED ORDER — MIDAZOLAM HCL 2 MG/2ML IJ SOLN
INTRAMUSCULAR | Status: AC
  Filled 2017-12-06: qty 2

## 2017-12-06 MED ORDER — OXYMETAZOLINE HCL 0.05 % NA SOLN
NASAL | Status: DC | PRN
  Administered 2017-12-06: 1 via NASAL

## 2017-12-06 MED ORDER — BUPIVACAINE-EPINEPHRINE 0.5% -1:200000 IJ SOLN
INTRAMUSCULAR | Status: DC | PRN
  Administered 2017-12-06: 3.6 mL

## 2017-12-06 MED ORDER — PROPOFOL 10 MG/ML IV BOLUS
INTRAVENOUS | Status: AC
  Filled 2017-12-06: qty 20

## 2017-12-06 MED ORDER — LIDOCAINE-EPINEPHRINE 2 %-1:100000 IJ SOLN
INTRAMUSCULAR | Status: AC
  Filled 2017-12-06: qty 10.2

## 2017-12-06 MED ORDER — EPHEDRINE SULFATE 50 MG/ML IJ SOLN
INTRAMUSCULAR | Status: AC
  Filled 2017-12-06: qty 1

## 2017-12-06 MED ORDER — ROCURONIUM BROMIDE 10 MG/ML (PF) SYRINGE
PREFILLED_SYRINGE | INTRAVENOUS | Status: AC
  Filled 2017-12-06: qty 5

## 2017-12-06 MED ORDER — FENTANYL CITRATE (PF) 100 MCG/2ML IJ SOLN
INTRAMUSCULAR | Status: DC | PRN
  Administered 2017-12-06 (×2): 50 ug via INTRAVENOUS
  Administered 2017-12-06: 100 ug via INTRAVENOUS
  Administered 2017-12-06: 50 ug via INTRAVENOUS

## 2017-12-06 MED ORDER — SODIUM CHLORIDE 0.9 % IJ SOLN
INTRAMUSCULAR | Status: AC
  Filled 2017-12-06: qty 10

## 2017-12-06 MED ORDER — BUPIVACAINE-EPINEPHRINE (PF) 0.5% -1:200000 IJ SOLN
INTRAMUSCULAR | Status: AC
  Filled 2017-12-06: qty 3.6

## 2017-12-06 MED ORDER — HYDROMORPHONE HCL 1 MG/ML IJ SOLN
INTRAMUSCULAR | Status: AC
  Filled 2017-12-06: qty 0.5

## 2017-12-06 MED ORDER — 0.9 % SODIUM CHLORIDE (POUR BTL) OPTIME
TOPICAL | Status: DC | PRN
  Administered 2017-12-06: 1000 mL

## 2017-12-06 MED ORDER — OXYMETAZOLINE HCL 0.05 % NA SOLN
NASAL | Status: AC
  Filled 2017-12-06: qty 15

## 2017-12-06 MED ORDER — FENTANYL CITRATE (PF) 100 MCG/2ML IJ SOLN
INTRAMUSCULAR | Status: DC
Start: 2017-12-06 — End: 2017-12-06
  Filled 2017-12-06: qty 2

## 2017-12-06 MED ORDER — FENTANYL CITRATE (PF) 250 MCG/5ML IJ SOLN
INTRAMUSCULAR | Status: AC
  Filled 2017-12-06: qty 5

## 2017-12-06 MED ORDER — PROPOFOL 10 MG/ML IV BOLUS
INTRAVENOUS | Status: DC | PRN
  Administered 2017-12-06: 20 mg via INTRAVENOUS
  Administered 2017-12-06: 130 mg via INTRAVENOUS
  Administered 2017-12-06: 50 mg via INTRAVENOUS

## 2017-12-06 MED ORDER — HYDROMORPHONE HCL 1 MG/ML IJ SOLN
INTRAMUSCULAR | Status: DC | PRN
  Administered 2017-12-06: 0.5 mg via INTRAVENOUS

## 2017-12-06 MED ORDER — AMINOCAPROIC ACID SOLUTION 5% (50 MG/ML)
10.0000 mL | ORAL | Status: DC
  Filled 2017-12-06: qty 100

## 2017-12-06 MED ORDER — HEMOSTATIC AGENTS (NO CHARGE) OPTIME
TOPICAL | Status: DC | PRN
  Administered 2017-12-06: 1 via TOPICAL

## 2017-12-06 MED ORDER — LIDOCAINE-EPINEPHRINE 2 %-1:100000 IJ SOLN
INTRAMUSCULAR | Status: DC | PRN
  Administered 2017-12-06: 6.8 mL via INTRADERMAL

## 2017-12-06 MED ORDER — ONDANSETRON HCL 4 MG/2ML IJ SOLN
INTRAMUSCULAR | Status: AC
  Filled 2017-12-06: qty 4

## 2017-12-06 MED ORDER — SUGAMMADEX SODIUM 200 MG/2ML IV SOLN
INTRAVENOUS | Status: DC | PRN
  Administered 2017-12-06: 200 mg via INTRAVENOUS

## 2017-12-06 SURGICAL SUPPLY — 42 items
ALCOHOL 70% 16 OZ (MISCELLANEOUS) ×3 IMPLANT
ATTRACTOMAT 16X20 MAGNETIC DRP (DRAPES) ×3 IMPLANT
BANDAGE HEMOSTAT MRDH 4X4 STRL (MISCELLANEOUS) IMPLANT
BLADE SURG 15 STRL LF DISP TIS (BLADE) ×2 IMPLANT
BLADE SURG 15 STRL SS (BLADE) ×6
BNDG HEMOSTAT MRDH 4X4 STRL (MISCELLANEOUS)
COVER SURGICAL LIGHT HANDLE (MISCELLANEOUS) ×3 IMPLANT
GAUZE PACKING FOLDED 2  STR (GAUZE/BANDAGES/DRESSINGS) ×2
GAUZE PACKING FOLDED 2 STR (GAUZE/BANDAGES/DRESSINGS) ×1 IMPLANT
GAUZE SPONGE 4X4 12PLY STRL (GAUZE/BANDAGES/DRESSINGS) ×2 IMPLANT
GAUZE SPONGE 4X4 16PLY XRAY LF (GAUZE/BANDAGES/DRESSINGS) ×5 IMPLANT
GLOVE BIO SURGEON STRL SZ 6.5 (GLOVE) ×2 IMPLANT
GLOVE BIO SURGEONS STRL SZ 6.5 (GLOVE) ×1
GLOVE SURG ORTHO 8.0 STRL STRW (GLOVE) ×3 IMPLANT
GOWN STRL REUS W/ TWL LRG LVL3 (GOWN DISPOSABLE) ×1 IMPLANT
GOWN STRL REUS W/TWL 2XL LVL3 (GOWN DISPOSABLE) ×3 IMPLANT
GOWN STRL REUS W/TWL LRG LVL3 (GOWN DISPOSABLE) ×3
HEMOSTAT SURGICEL 2X14 (HEMOSTASIS) IMPLANT
KIT BASIN OR (CUSTOM PROCEDURE TRAY) ×3 IMPLANT
KIT TURNOVER KIT B (KITS) ×3 IMPLANT
MANIFOLD NEPTUNE WASTE (CANNULA) ×3 IMPLANT
NDL BLUNT 16X1.5 OR ONLY (NEEDLE) ×1 IMPLANT
NDL DENTAL 27 LONG (NEEDLE) ×2 IMPLANT
NEEDLE BLUNT 16X1.5 OR ONLY (NEEDLE) ×3 IMPLANT
NEEDLE DENTAL 27 LONG (NEEDLE) ×6 IMPLANT
NS IRRIG 1000ML POUR BTL (IV SOLUTION) ×3 IMPLANT
PACK EENT II TURBAN DRAPE (CUSTOM PROCEDURE TRAY) ×3 IMPLANT
PAD ARMBOARD 7.5X6 YLW CONV (MISCELLANEOUS) ×3 IMPLANT
SPONGE SURGIFOAM ABS GEL 100 (HEMOSTASIS) IMPLANT
SPONGE SURGIFOAM ABS GEL 12-7 (HEMOSTASIS) ×2 IMPLANT
SPONGE SURGIFOAM ABS GEL SZ50 (HEMOSTASIS) IMPLANT
SUCTION FRAZIER HANDLE 10FR (MISCELLANEOUS) ×2
SUCTION TUBE FRAZIER 10FR DISP (MISCELLANEOUS) ×1 IMPLANT
SUT CHROMIC 3 0 PS 2 (SUTURE) ×10 IMPLANT
SUT CHROMIC 4 0 P 3 18 (SUTURE) ×2 IMPLANT
SYR 50ML SLIP (SYRINGE) ×3 IMPLANT
TOWEL NATURAL 10PK STERILE (DISPOSABLE) ×3 IMPLANT
TUBE CONNECTING 12'X1/4 (SUCTIONS) ×1
TUBE CONNECTING 12X1/4 (SUCTIONS) ×2 IMPLANT
WATER STERILE IRR 1000ML POUR (IV SOLUTION) ×3 IMPLANT
WATER TABLETS ICX (MISCELLANEOUS) ×3 IMPLANT
YANKAUER SUCT BULB TIP NO VENT (SUCTIONS) ×3 IMPLANT

## 2017-12-06 NOTE — H&P (Signed)
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  Patient with anticipated mitral valve repair or replacement in the future with Dr. Cornelius Moras.  Patient was seen as part of medically necessary pre-heart valve surgery dental protocol examination.  Patient now presents for extraction of remaining teeth with alveoloplasty in the operating room with general anesthesia.  Patient denies any acute medical or dental changes.  Please see note from Dr. Cornelius Moras dated 11/17/2017 to act as the H&P for the dental operating room procedure.  Charlynne Pander, DDS    [facility]  MULTIDISCIPLINARY HEART VALVE CLINIC CARDIOTHORACIC SURGERY CONSULTATION REPORT     Referring Provider is [name], MD   Primary Cardiologist is Branch, Dorothe Pea, MD  PCP is Sasser, Clarene Critchley, MD   Chief Complaint   Patient presents with .   Mitral Stenosis   new patient, Cath 11/03/2017, ECHO 11/03/2017  HPI:  Patient is a 42 year old obese white female who has been referred for recently discovered likely rheumatic heart disease with severe symptomatic primary mitral regurgitation and mitral stenosis.  The patient has been reasonably healthy for most of her life.  She has a long history of tobacco abuse as well as chronic anxiety and panic disorder.  She describes a long history of mild exertional shortness of breath which she attributed to her tobacco use and obesity.  She developed rapidly progressive symptoms of exertional shortness of breath over the last few months.  She developed orthopnea, lower extremity edema, and intermittent resting shortness of breath.  She began to experience episodes of PND and  subsequently presented to [facility] in [place] with likely acute exacerbation of chronic diastolic congestive heart failure and pulmonary edema.  CT angiogram of the chest was negative for pulmonary embolus.  An echocardiogram performed at that time revealed normal left ventricular systolic function with rheumatic appearing mitral valve and severe mitral stenosis.  Symptoms improved with diuretic therapy.  Patient was referred for outpatient cardiology consultation and evaluated by Dr. [name] in early April.  She subsequently underwent transesophageal echocardiogram and diagnostic cardiac catheterization on Nov 03, 2017.  TEE confirmed the presence of rheumatic appearing mitral valve with moderate mitral stenosis and severe mitral regurgitation.  Diagnostic cardiac catheterization revealed normal coronary arteries with mild pulmonary hypertension and large V waves on wedge tracing consistent with severe mitral regurgitation.  The patient was referred for elective surgical consultation.  Patient is married and lives with her husband and 3 children in [place], [place].  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  She reports no significant physical limitations although she admits that she does not exercise on a regular basis.  She has experienced decreased energy, loss of appetite, and worsening exertional shortness of breath over several months with acute exacerbation in March as noted previously.  Since hospital discharge he has been doing better on oral Lasix therapy although  she reports that she still gets short of breath with moderate level activity.  She has not had chest pain or chest tightness.  She has had some dizzy spells without syncope.  She has occasional palpitations without any known history of irregular heart rhythms.      Past Medical History:  Diagnosis   Date  . Acute pulmonary edema (HCC)  . Panic disorder without agoraphobia  .  Precordial pain  . Rheumatic mitral stenosis with regurgitation  . Tricuspid regurgitation      Past Surgical History:  Procedure   Laterality   Date  . APPENDECTOMY  age 33  . RIGHT AND LEFT HEART CATH  N/A   11/03/2017  Procedure: RIGHT AND LEFT HEART CATH;  Surgeon: Kathleene Hazel, MD;  Location: [facility] INVASIVE CV LAB;  Service: Cardiovascular;  Laterality: N/A;  . TEE WITHOUT CARDIOVERSION    N/A        11/03/2017   Procedure: TRANSESOPHAGEAL ECHOCARDIOGRAM (TEE);  Surgeon: Lewayne Bunting, MD;  Location[facility] ENDOSCOPY;  Service: Cardiovascular;  Laterality: N/A;  . TONSILLECTOMY   6         Family History    Problem   Relation   Age of Onset    .   Cancer   Father        .   Dementia   Maternal Grandmother        .   Heart disease   Maternal Grandfather                had open heart problems    .   Cancer   Paternal Grandmother        .   Cancer   Paternal Grandfather               Social History             Socioeconomic History    .   Marital status:   Married            Spouse name:   Not on file    .   Number of children:   Not on file    .   Years of education:   Not on file    .   Highest education level:   Not on file    Occupational History    .   Not on file    Social Needs    .   Financial resource strain:   Not on file    .   Food insecurity:            Worry:   Not on file            Inability:   Not on file    .   Transportation needs:            Medical:   Not on file            Non-medical:   Not on file    Tobacco Use    .   Smoking status:   Former Smoker            Packs/day:   0.50            Years:   26.00            Pack years:   13.00            Types:   Cigarettes  Start date:   12/18/1991            Last attempt to quit:    09/23/2017            Years since quitting:   0.1    .   Smokeless tobacco:   Never Used    Substance and Sexual Activity    .   Alcohol use:   Not on file    .   Drug use:   Not on file    .   Sexual activity:   Not on file    Lifestyle    .   Physical activity:            Days per week:   Not on file            Minutes per session:   Not on file    .   Stress:   Not on file    Relationships    .   Social connections:            Talks on phone:   Not on file            Gets together:   Not on file            Attends religious service:   Not on file            Active member of club or organization:   Not on file            Attends meetings of clubs or organizations:   Not on file            Relationship status:   Not on file    .   Intimate partner violence:            Fear of current or ex partner:   Not on file            Emotionally abused:   Not on file            Physically abused:   Not on file            Forced sexual activity:   Not on file    Other Topics   Concern    .   Not on file    Social History Narrative    .   Not on file                 Current Outpatient Medications    Medication   Sig   Dispense   Refill    .   albuterol (PROAIR HFA) 108 (90 Base) MCG/ACT inhaler   Inhale 2 puffs into the lungs every 4 (four) hours as needed for wheezing or shortness of breath.             .   ALPRAZolam (XANAX) 0.5 MG tablet   Take 0.5 mg by mouth 4 (four) times daily as needed for anxiety.             .   Aspirin-Acetaminophen-Caffeine (GOODY HEADACHE PO)   Take 1 packet by mouth daily as needed (for headache).            .   fluticasone (FLONASE) 50 MCG/ACT nasal spray   Place 2 sprays into both nostrils daily as needed for allergies.             .    furosemide (LASIX) 40 MG tablet   Take 1.5 tablets (  60 mg total) by mouth daily.   45 tablet   3    .   sertraline (ZOLOFT) 100 MG tablet   Take 200 mg by mouth daily.             .   traZODone (DESYREL) 100 MG tablet   Take 200 mg by mouth at bedtime.                 No current facility-administered medications for this visit.           No Known Allergies           Review of Systems:                 General:                      decreased appetite, decreased energy, + weight gain, no weight loss, no fever              Cardiac:                       no chest pain with exertion, no chest pain at rest, + SOB with exertion, + resting SOB, + PND, + orthopnea, + palpitations, no arrhythmia, no atrial fibrillation, + LE edema, + dizzy spells, no syncope              Respiratory:                 + shortness of breath, no home oxygen, no productive cough, + dry cough, no bronchitis, no wheezing, no hemoptysis, no asthma, no pain with inspiration or cough, no sleep apnea, no CPAP at night              GI:                               no difficulty swallowing, + reflux, + frequent heartburn, no hiatal hernia, no abdominal pain, no constipation, + diarrhea, no hematochezia, no hematemesis, no melena              GU:                              no dysuria,  + frequency, no urinary tract infection, no hematuria, no kidney stones, no kidney disease              Vascular:                     no pain suggestive of claudication, no pain in feet, + leg cramps, no varicose veins, no DVT, no non-healing foot ulcer              Neuro:                         no stroke, no TIA's, no seizures, no headaches, no temporary blindness one eye,  no slurred speech, no peripheral neuropathy, no chronic pain, no instability of gait, no memory/cognitive dysfunction              Musculoskeletal:         no arthritis, no joint swelling, no myalgias, no difficulty walking, normal  mobility               Skin:  no rash, no itching, no skin infections, no pressure sores or ulcerations              Psych:                         + anxiety, no depression, no nervousness, no unusual recent stress              Eyes:                           no blurry vision, no floaters, no recent vision changes, no wears glasses or contacts              ENT:                            no hearing loss, no loose or painful teeth, no dentures, last saw dentist > 1 year ago               Hematologic:               no easy bruising, no abnormal bleeding, no clotting disorder, no frequent epistaxis              Endocrine:                   no diabetes, does not check CBG's at home                                                                Physical Exam:                 BP 102/73 (BP Location: Right Arm, Patient Position: Sitting, Cuff Size: Normal)   Pulse 76   Resp 18   Ht  (1.727 m)   Wt 229 lb (103.9 kg)   SpO2 99% Comment: RA  BMI 34.82 kg/m               General:                      Obese,  well-appearing              HEENT:                       Unremarkable               Neck:                           no JVD, no bruits, no adenopathy               Chest:                          clear to auscultation, symmetrical breath sounds, no wheezes, no rhonchi               CV:                              RRR, grade III/VI crescendo/decrescendo murmur  heard best at LSB,  no diastolic murmur              Abdomen:                    soft, non-tender, no masses               Extremities:                 warm, well-perfused, pulses diminished but palpable, no LE edema              Rectal/GU                   Deferred              Neuro:                         Grossly non-focal and symmetrical throughout              Skin:                            Clean and dry, no rashes, no breakdown        Diagnostic Tests:      Transesophageal Echocardiography     Patient:    Safari, Cinque  MR #:       [id number]  Study Date: 11/03/2017  Gender:     F  Age:        41  Height:     172.7 cm  Weight:     106.6 kg  BSA:        2.3 m^2  Pt. Status:  Room:      ADMITTING    [place]ga [place]   PERFORMING   [place]   ATTENDING    Patrick Jupiter, M.D.   Lisette Abu, M.D.   REFERRING    Patrick Jupiter, M.D.   SONOGRAPHER  Sheralyn Boatman     cc:     -------------------------------------------------------------------  LV EF: 55% -   60%     -------------------------------------------------------------------  Indications:      424.0 Mitral valve disease. Severe valvular  disease     -------------------------------------------------------------------  History:   PMH:  Mitral regurgitation. Pulmonary edema.  Mitral  stenosis.     -------------------------------------------------------------------  Study Conclusions     - Left ventricle: Systolic function was normal. The estimated    ejection fraction was in the range of 55% to 60%. Wall motion was    normal; there were no regional wall motion abnormalities.  - Aortic valve: There was trivial regurgitation.  - Mitral valve: Mild thickening, consistent with rheumatic disease.    Mobility of the posterior leaflet was severely restricted. The    findings are consistent with moderate stenosis. There was severe    regurgitation. Valve area by pressure half-time: 1.76 cm^2.  - Left atrium: The atrium was severely dilated. No evidence of    thrombus in the atrial cavity or appendage.  - Atrial septum: No defect or patent foramen ovale was identified.  - Tricuspid valve: No evidence of vegetation. There was    mild-moderate regurgitation.  - Pulmonic valve: No evidence of vegetation.     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     -------------------------------------------------------------------  Study data:   Study status:  Routine.  Consent:  The risks,  benefits, and alternatives to the procedure were explained to the  patient and informed consent was obtained.  Procedure:  The patient  reported no pain pre or post test. Initial setup. The patient was  brought to the laboratory. Surface ECG leads were monitored.  Sedation. Conscious sedation was administered by cardiology staff.  Transesophageal echocardiography. Topical anesthesia was obtained  using viscous lidocaine. An adult multiplane transesophageal probe  was inserted by the attending cardiologistwithout difficulty. 3D  image quality was excellent.  Study completion:  The patient  tolerated the procedure well. There were no complications.  Administered medications:   Fentanyl, , IV.  Midazolam, 8mg ,  IV.  Diphenhydramine, 25mg , IV.          Diagnostic transesophageal  echocardiography.  2D and color Doppler.  Birthdate:  Patient  birthdate: [birth year].  Age:  Patient is 42 yr old.  Sex:  Gender:  female.    BMI: 35.7 kg/m^2.  Blood pressure:     113/63  Patient  status:  Outpatient.  Study date:  Study date: 11/03/2017. Study  time: 07:57 AM.  Location:  Endoscopy.     -------------------------------------------------------------------     -------------------------------------------------------------------  Left ventricle:  Systolic function was normal. The estimated  ejection fraction was in the range of 55% to 60%. Wall motion was  normal; there were no regional wall motion abnormalities.      -------------------------------------------------------------------  Aortic valve:   Trileaflet; mildly thickened leaflets. Cusp  separation was normal.  Doppler:  There was trivial regurgitation.     -------------------------------------------------------------------  Aorta:  Aortic root: The aortic root had mild diffuse disease.     -------------------------------------------------------------------  Mitral valve:   Mild thickening, consistent with rheumatic disease.  Mobility of the posterior leaflet was severely restricted.  Doppler:   The findings are consistent with moderate stenosis.  There was severe regurgitation.    Valve area by pressure  half-time: 1.76 cm^2. Indexed valve area by pressure half-time:  0.76 cm^2/m^2.    Mean gradient (D): 15 mm Hg.     -------------------------------------------------------------------  Left atrium:  The atrium was severely dilated.  No evidence of  thrombus in the atrial cavity or appendage.     -------------------------------------------------------------------  Atrial septum:  No defect or patent foramen ovale was identified.     -------------------------------------------------------------------  Right ventricle:  Systolic function was normal.     -------------------------------------------------------------------  Pulmonic valve:    Structurally normal valve.   Cusp separation was  normal.  No evidence of vegetation.     -------------------------------------------------------------------  Tricuspid valve:   Structurally normal valve.   Leaflet separation  was normal.  No evidence of vegetation.  Doppler:  There was  mild-moderate regurgitation.     -------------------------------------------------------------------  Right atrium:  The atrium was normal in size.     -------------------------------------------------------------------  Pericardium:  There was no pericardial effusion.      -------------------------------------------------------------------  Measurements      Mitral valve                        Value   Mitral mean velocity, D  188   cm/s   Mitral pressure half-time           125   ms   Mitral mean gradient, D             15    mm Hg   Mitral valve area, PHT, DP          1.76  cm^2   Mitral valve area/bsa, PHT, DP      0.76  cm^2/m^2   Mitral annulus VTI, D               97    cm   Mitral regurg VTI, PISA             172   cm   Mitral ERO, PISA                    0.84  cm^2   Mitral regurg volume, PISA          144   ml     Legend:  (L)  and  (H)  mark values outside specified reference range.     -------------------------------------------------------------------  Prepared and Electronically Authenticated by     Olga Millers  2019-05-09T14:28:35             RIGHT AND LEFT HEART CATH    Conclusion        1. NO angiographic evidence of CAD  2. Large v-wave on wedge tracing c/w severe MR     Recommendations: Continue workup for surgical MV replacement/repair. I do not think she will be a candidate for balloon valvuloplasty given her severe MR.     Indications        Mitral valve stenosis, unspecified etiology [I05.0 (ICD-10-CM)]    Procedural Details/Technique        Technical Details   Indication: 42 yo female with h/o tobacco abuse, severe MS/MR.   Procedure: The risks, benefits, complications, treatment options, and expected outcomes were discussed with the patient. The patient and/or family concurred with the proposed plan, giving informed consent. The patient was brought to the cath lab after IV hydration was given. The patient was further sedated with Versed and Fentanyl. There was an IV catheter present in the right antecubital vein. I prepped this area and changed the catheter out for a 5 French sheath. Right heart cath performed with a balloon tipped catheter. The right wrist  was prepped and draped in a sterile fashion. 1% lidocaine was used for local anesthesia. Using the modified Seldinger access technique, a 5 French sheath was placed in the right radial artery. 3 mg Verapamil was given through the sheath. 5000 units IV heparin was given. Standard diagnostic catheters were used to perform selective coronary angiography. I did not cross the aortic valve due to a density noted on the leaflet during TEE. The sheath was removed from the right radial artery and a Terumo hemostasis band was applied at the arteriotomy site on the right wrist.     Estimated blood loss <50 mL.  During this procedure the patient was administered the following to achieve and maintain moderate conscious sedation: Versed 2 mg, Fentanyl 50 mcg, while the patient's heart rate, blood pressure, and oxygen saturation were continuously monitored. The period of conscious sedation was 27 minutes, of which I was present face-to-face 100% of this time.    Complications        Complications documented before study signed (11/03/2017  2:52 PM EDT)  RIGHT AND LEFT HEART CATH       None   Documented by Kathleene Hazel, MD 11/03/2017  2:38 PM EDT    Time Range: Intraprocedure            Coronary Findings      Diagnostic   Dominance: Right     Left Anterior Descending    Vessel is large.    First Diagonal Branch    Vessel is large in size.    Left Circumflex    Second Obtuse Marginal Branch    Vessel is moderate in size.    Right Coronary Artery    Vessel is large.    Intervention      No interventions have been documented.   Right Heart        Right Heart Pressures   Hemodynamic findings consistent with mild pulmonary hypertension.    Coronary Diagrams        Diagnostic Diagram       untitled image       Implants           No implant documentation for this case.    MERGE Images        Show images for  CARDIAC CATHETERIZATION      Link to Procedure Log       Procedure Log       Hemo Data              Most Recent Value     Fick Cardiac Output   6.77 L/min    Fick Cardiac Output Index   3.09 (L/min)/BSA    RA A Wave   17 mmHg    RA V Wave   13 mmHg    RA Mean   12 mmHg    RV Systolic Pressure   44 mmHg    RV Diastolic Pressure   6 mmHg    RV EDP   12 mmHg    PA Systolic Pressure   47 mmHg    PA Diastolic Pressure   22 mmHg    PA Mean   33 mmHg    PW A Wave   28 mmHg    PW V Wave   48 mmHg    PW Mean   30 mmHg    AO Systolic Pressure   92 mmHg    AO Diastolic Pressure   62 mmHg    AO Mean   77 mmHg    QP/QS   1    TPVR Index   10.68 HRUI    TSVR Index   24.91 HRUI    PVR SVR Ratio   0.05    TPVR/TSVR Ratio   0.43                   Impression:     I have personally reviewed the patient's recent transesophageal echocardiogram and diagnostic cardiac catheterization.  She has findings consistent with rheumatic mitral valve disease with stage D severe symptomatic mitral regurgitation and moderate mitral stenosis.  Mitral valve has classical features of rheumatic disease with leaflet thickening, restricted leaflet mobility, and some foreshortening of the subvalvular apparatus.  The posterior leaflet is very severely restricted and nearly fixed.  There is a broad jet of regurgitation with flow reversal in the pulmonary veins.  There is moderate left atrial enlargement.  Left ventricular size and function appear normal.  The aortic valve appears normal.  Right ventricular size and function is normal.  The tricuspid  annulus does not appear to be dilated.  There is mild tricuspid regurgitation.  Diagnostic cardiac catheterization revealed normal coronary artery anatomy with no significant coronary artery disease.  There was mild pulmonary hypertension and large V waves  consistent with severe mitral regurgitation.  I agree the patient needs mitral valve repair or replacement.  Based upon review of the patient's transesophageal echocardiogram I feel that the likelihood of durable valve repair is probably relatively low.  Patient appears to be an acceptable candidate for minimally invasive approach.  The patient does appear to have the possibility of ongoing dental problems and needs dental consultation prior to surgery.        Plan:     The patient was counseled at length regarding the indications, risks and potential benefits of mitral valve repair or replacement.  The rationale for elective surgery has been explained, including a comparison between surgery and continued medical therapy with close follow-up.  The likelihood of successful and durable valve repair has been discussed with particular reference to the findings of their recent echocardiogram.  Based upon these findings and previous experience, I have quoted them a less than 50 percent likelihood of successful valve repair.  In the unlikely event that their valve cannot be successfully repaired, we discussed the possibility of replacing the mitral valve using a mechanical prosthesis with the attendant need for long-term anticoagulation versus the alternative of replacing it using a bioprosthetic tissue valve with its potential for late structural valve deterioration and failure, depending upon the patient's longevity.  The patient specifically requests that if the mitral valve must be replaced that it be done using a mechanical valve.   Alternative surgical approaches have been discussed including a comparison between conventional sternotomy and minimally-invasive techniques.  The relative risks and benefits of each have been reviewed as they pertain to the patient's specific circumstances, and all of their questions have been addressed.  Expectations for the patient's postoperative convalescence have been  discussed.  All of their questions have been answered.  We tentatively plan to proceed with surgery on December 21, 2017.  Patient will be referred for dental service consultation.  She will also undergo CT angiography to evaluate the feasibility of peripheral cannulation for surgery.        I spent in excess of 90 minutes during the conduct of this office consultation and >50% of this time involved direct face-to-face encounter with the patient for counseling and/or coordination of their care.           Salvatore Decent. Cornelius Moras, MD  11/17/2017  6:16 PM                     Electronically signed by Purcell Nails, MD at 11/17/2017  6:33 PM

## 2017-12-06 NOTE — Discharge Instructions (Signed)

## 2017-12-06 NOTE — Anesthesia Preprocedure Evaluation (Addendum)
Anesthesia Evaluation  Patient identified by MRN, date of birth, ID band Patient awake    Reviewed: Allergy & Precautions, NPO status , Patient's Chart, lab work & pertinent test results  History of Anesthesia Complications (+) PONV and history of anesthetic complications  Airway Mallampati: II  TM Distance: >3 FB Neck ROM: Full    Dental  (+) Missing, Loose, Chipped, Poor Dentition   Pulmonary shortness of breath, former smoker,    breath sounds clear to auscultation- rhonchi       Cardiovascular +CHF  + dysrhythmias + Valvular Problems/Murmurs MR  Rhythm:Regular + Systolic murmurs MS   Neuro/Psych PSYCHIATRIC DISORDERS Anxiety Depression negative neurological ROS     GI/Hepatic Neg liver ROS,   Endo/Other  Morbid obesity  Renal/GU negative Renal ROS     Musculoskeletal   Abdominal   Peds  Hematology negative hematology ROS (+)   Anesthesia Other Findings  Patient is a 42 year old female scheduled for the above procedure. She has known rheumatic mitral valve disease with mini MV repair versus replacement currently scheduled for 12/22/17 by Tressie Stalker, MD. He referred patient for a pre-valve dental consult. According to Dr. Luretha Murphy note, extraction of remaining teeth with alveoloplasty is planned. Anesthesia consult requested to evaluate for "assessability to proceed with general anesthesia with nasoendotracheal tube."  History includes former smoker (quit 09/23/17), rheumatic mitral stenosis with regurgitation, tricuspid regurgitation, panic disorder without agoraphobia. Admission to [facility] 08/2017 with pulmonary edema with echo showing severe MS on TTE (TEE showed moderate MS, severe MR).   Today she feels she is doing well. Weights have been stable. No SOB at rest. Was able to walk from hospital entrance to PAT without difficultly--tries to pace herself with activity. She started sleeping on two pillows  prior to her 08/2017 CHF admission, and although feels her fluid status is well controlled on diuretic therapy she continues to use three pillows for comfort. She denied blood thinners, epistaxis, nasal fracture, chronic sinus issues. She has occasional allergies. She feels she can breath equally as well from either nares. She is aware that she is being considered for nasoendotracheal intubation. Further evaluation by her assigned anesthesiologist on the day of surgery.   VS: BP 119/68   Pulse 75   Temp 36.7 C   Resp 18   Ht 5\' 8"  (1.727 m)   Wt 231 lb (104.8 kg)   SpO2 100%   BMI 35.12 kg/m  Patient in NAD. No conversational dyspnea. Heart RRR. Grade II/VI murmur present. Left lung base sounds mildly course initially but cleared with cough. Remaining lung sounds clear. No pitting ankle edema. No carotid bruits noted. Outward nasal exam suggests some nasal septal deviation (columella deviates leftward). (She feels she breaths equally well from either nares.)  PROVIDERS: Sasser, Clarene Critchley, MD is PCP.  Dina Rich, MD is cardiologist.    LABS: Labs reviewed: Acceptable for surgery. (all labs ordered are listed, but only abnormal results are displayed)  Labs Reviewed BASIC METABOLIC PANEL - Abnormal; Notable for the following components:     Result Value   Potassium 3.4 (*)   All other components within normal limits CBC   IMAGES: She is scheduled for CTA chest/abd/pelvis, pre-cardiac surgery Dopplers, and PFTs on 12/19/17.    EKG: 10/28/17: SR, low voltage precordial leads. Baseline wander in leads V5-6.    CV: TEE 11/03/17: Study Conclusions - Left ventricle: Systolic function was normal. The estimated ejection fraction was in the range of 55% to 60%. Wall  motion was normal; there were no regional wall motion abnormalities. - Aortic valve: There was trivial regurgitation. - Mitral valve: Mild thickening, consistent with rheumatic disease. Mobility of the  posterior leaflet was severely restricted. The findings are consistent with moderate stenosis. There was severe regurgitation. Valve area by pressure half-time: 1.76 cm^2. - Left atrium: The atrium was severely dilated. No evidence of thrombus in the atrial cavity or appendage. - Atrial septum: No defect or patent foramen ovale was identified. - Tricuspid valve: No evidence of vegetation. There was mild-moderate regurgitation. - Pulmonic valve: No evidence of vegetation. 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  Cardiac cath 11/03/17: 1. No angiographic evidence of CAD 2. Large v-wave on wedge tracing c/w severe MR Recommendations: Continue workup for surgical MV replacement/repair. I do not think she will be a candidate for balloon valvuloplasty given her severe MR.    Past Medical History: Diagnosis Date . Acute pulmonary edema (HCC)  . Panic disorder without agoraphobia  . Precordial pain  . Rheumatic mitral stenosis with regurgitation  . Tricuspid regurgitation    Past Surgical History: Procedure Laterality Date . APPENDECTOMY    age 41 . RIGHT AND LEFT HEART CATH N/A 11/03/2017  Procedure: RIGHT AND LEFT HEART CATH;  Surgeon: Kathleene Hazel, MD;  Location: [facility] INVASIVE CV LAB;  Service: Cardiovascular;  Laterality: N/A; . TEE WITHOUT CARDIOVERSION N/A 11/03/2017  Procedure: TRANSESOPHAGEAL ECHOCARDIOGRAM (TEE);  Surgeon: Lewayne Bunting, MD;  Location: [facility] ENDOSCOPY;  Service: Cardiovascular;  Laterality: N/A; . TONSILLECTOMY    6   MEDICATIONS: . albuterol (PROAIR HFA) 108 (90 Base) MCG/ACT inhaler . ALPRAZolam  (XANAX) 0.5 MG tablet . fluticasone (FLONASE) 50 MCG/ACT nasal spray . furosemide (LASIX) 40 MG tablet . sertraline (ZOLOFT) 100 MG tablet . traZODone (DESYREL) 100 MG tablet  No current facility-administered medications for this encounter.      Reproductive/Obstetrics                            Anesthesia Physical Anesthesia Plan  ASA: III  Anesthesia Plan: General   Post-op Pain Management:    Induction: Intravenous  PONV Risk Score and Plan: 4 or greater and Ondansetron and Dexamethasone  Airway Management Planned: Nasal ETT  Additional Equipment: None  Intra-op Plan:   Post-operative Plan: Extubation in OR  Informed Consent: I have reviewed the patients History and Physical, chart, labs and discussed the procedure including the risks, benefits and alternatives for the proposed anesthesia with the patient or authorized representative who has indicated his/her understanding and acceptance.   Dental advisory given  Plan Discussed with: CRNA and Surgeon  Anesthesia Plan Comments:         Anesthesia Quick Evaluation

## 2017-12-06 NOTE — Anesthesia Procedure Notes (Signed)
Procedure Name: Intubation Date/Time: 12/06/2017 7:43 AM Performed by: Marny Lowenstein, CRNA Pre-anesthesia Checklist: Patient identified, Emergency Drugs available, Suction available, Patient being monitored and Timeout performed Patient Re-evaluated:Patient Re-evaluated prior to induction Oxygen Delivery Method: Circle system utilized Preoxygenation: Pre-oxygenation with 100% oxygen Induction Type: IV induction Ventilation: Mask ventilation without difficulty Laryngoscope Size: Miller and 2 Grade View: Grade I Nasal Tubes: Nasal Rae, Magill forceps- large, utilized, Nasal prep performed and Left Tube size: 7.0 mm Number of attempts: 1 Placement Confirmation: ETT inserted through vocal cords under direct vision,  positive ETCO2,  CO2 detector and breath sounds checked- equal and bilateral Tube secured with: Tape Dental Injury: Teeth and Oropharynx as per pre-operative assessment

## 2017-12-06 NOTE — Progress Notes (Signed)
PRE-OPERATIVE NOTE:  12/06/2017 Gloria James [id number]  VITALS: BP 110/60   Pulse 73   Temp 97.6 F (36.4 C) (Oral)   Resp 20   Ht 5\' 8"  (1.727 m)   Wt 231 lb (104.8 kg)   SpO2 95%   BMI 35.12 kg/m   Lab Results  Component Value Date   WBC 5.5 12/05/2017   HGB 12.5 12/05/2017   HCT 39.4 12/05/2017   MCV 83.1 12/05/2017   PLT 227 12/05/2017   BMET    Component Value Date/Time   NA 142 12/05/2017 1025   K 3.4 (L) 12/05/2017 1025   CL 107 12/05/2017 1025   CO2 26 12/05/2017 1025   GLUCOSE 81 12/05/2017 1025   BUN 11 12/05/2017 1025   CREATININE 0.86 12/05/2017 1025   CALCIUM 9.1 12/05/2017 1025   GFRNONAA >60 12/05/2017 1025   GFRAA >60 12/05/2017 1025    Lab Results  Component Value Date   INR 1.05 11/03/2017   No results found for: PTT   Gloria James presents for extraction of remaining teeth with alveoloplasty in the operating room with general anesthesia.     SUBJECTIVE: The patient denies any acute medical or dental changes and agrees to proceed with treatment as planned.  EXAM: No sign of acute dental changes.  ASSESSMENT: Patient is affected by chronic apical periodontitis, dental caries, chronic periodontitis, loose teeth, and dental phobia.  PLAN: Patient agrees to proceed with treatment as planned in the operating room as previously discussed and accepts the risks, benefits, and complications of the proposed treatment. Patient is aware of the risk for bleeding, bruising, swelling, infection, pain, nerve damage, soft tissue damage, sinus involvement, root tip fracture, mandible fracture, and the risks of complications associated with the anesthesia. Patient also is aware of the potential for other complications up to and including death due to her overall cardiovascular compromise.     Charlynne Pander, DDS

## 2017-12-06 NOTE — Transfer of Care (Signed)
Immediate Anesthesia Transfer of Care Note  Patient: Gloria James  Procedure(s) Performed: Extraction of tooth #'s 4-6, 8-12, 20,22-29 and 32 with alveoloplasty (N/A )  Patient Location: PACU  Anesthesia Type:General  Level of Consciousness: awake, alert  and oriented  Airway & Oxygen Therapy: Patient Spontanous Breathing and Patient connected to face mask oxygen  Post-op Assessment: Report given to RN and Post -op Vital signs reviewed and stable  Post vital signs: Reviewed and stable  Last Vitals:  Vitals Value Taken Time  BP 121/77 12/06/2017  9:50 AM  Temp 36.4 C 12/06/2017  9:50 AM  Pulse 99 12/06/2017  9:56 AM  Resp 20 12/06/2017  9:56 AM  SpO2 99 % 12/06/2017  9:56 AM  Vitals shown include unvalidated device data.  Last Pain:  Vitals:   12/06/17 0950  TempSrc:   PainSc: 0-No pain         Complications: No apparent anesthesia complications

## 2017-12-06 NOTE — Op Note (Signed)
OPERATIVE REPORT  Patient:            Gloria James Date of Birth:  [birth year] MRN:                [id number]   DATE OF PROCEDURE:  12/06/2017  PREOPERATIVE DIAGNOSES: 1.  Rheumatic mitral stenosis with regurgitation 2.  Pre-heart valve surgery dental protocol 3.  Chronic apical periodontitis 4.  Dental caries 5.  Chronic periodontitis 6.  Loose teeth 7.  Dental phobia  POSTOPERATIVE DIAGNOSES: 1.  Rheumatic mitral stenosis with regurgitation 2.  Pre-heart valve surgery dental protocol 3.  Chronic apical periodontitis 4.  Dental caries 5.  Chronic periodontitis 6.  Loose teeth 7.  Dental phobia  OPERATIONS: 1. Multiple extraction of tooth numbers 4-6, 8-12, 20,22-29, and 32 2. 4 Quadrants of alveoloplasty   SURGEON: Charlynne Pander, DDS  ASSISTANT: Pearletha Alfred (dental assistant)  ANESTHESIA: General anesthesia via nasoendotracheal tube.  MEDICATIONS: 1. Ancef 2 g IV prior to invasive dental procedures. 2. Local anesthesia with a total utilization of 6 carpules each containing 34 mg of lidocaine with 0.017 mg of epinephrine as well as 2 carpules each containing 9 mg of bupivacaine with 0.009 mg of epinephrine.  SPECIMENS: There are 18 teeth that were discarded.  DRAINS: None  CULTURES: None  COMPLICATIONS: None  ESTIMATED BLOOD LOSS: 150 mLs.  INTRAVENOUS FLUIDS: 800 mLs of Lactated ringers solution.  INDICATIONS: The patient was recently diagnosed with rheumatic mitral stenosis with regurgitation.  A medically necessary dental consultation was then requested to evaluate poor dentition.  The patient was examined and treatment planned for extraction of remaining teeth with alveoloplasty in the operating room with general anesthesia.  This treatment plan was formulated to decrease the risks and complications associated with dental infection from affecting the patient's systemic health and anticipated heart valve surgery.  OPERATIVE FINDINGS: Patient  was examined in operating room number 8.  The teeth were identified for extraction. The patient was noted be affected by chronic periodontitis, chronic apical periodontitis, dental caries, loose teeth, and dental phobia.  DESCRIPTION OF PROCEDURE: Patient was brought to the main operating room number #8. Patient was then placed in the supine position on the operating table. General anesthesia was then induced per the anesthesia team. The patient was then prepped and draped in the usual manner for dental medicine procedure. A timeout was performed. The patient was identified and procedures were verified. A throat pack was placed at this time. The oral cavity was then thoroughly examined with the findings noted above. The patient was then ready for dental medicine procedure as follows:  Local anesthesia was then administered sequentially with a total utilization of 6 carpules each containing 34 mg of lidocaine with 0.017 mg of epinephrine as well as 2 carpules  each containing 9 mg bupivacaine with 0.009 mg of epinephrine.  The Maxillary left and right quadrants first approached. Anesthesia was then delivered utilizing infiltration with lidocaine with epinephrine. A #15 blade incision was then made from the distal of #2 and extended to the mesial of #15.  A  surgical flap was then carefully reflected. The teeth were then subluxated with a series of straight elevators. A surgical handpiece and bur and copious amounts sterile water were used to remove buccal and interseptal bone around tooth numbers 4, 5, 6, 11, and 12.  The maxillary teeth were then again subluxated with a series of straight elevators.Tooth numbers 4-6, and 8-12  were then removed with a 150  forceps without complications. Alveoloplasty was then performed utilizing a ronguers and bone file to help achieve primary closure. The surgical site was then irrigated with copious amounts of sterile saline. The tissues were approximated and trimmed  appropriately. The maxillary right surgical site was then closed from the mesial #1 and extended to the mesial #8 utilizing 3-0 chromic gut suture in a continuous interrupted suture technique x1.  The maxillary left surgical site was then closed from the mesial #15 extended to the mesial #9 utilizing 3-0 chromic gut suture in a continuous interrupted suture technique x1.    At this point time, the mandibular quadrants were approached. The patient was given bilateral inferior alveolar nerve blocks and long buccal nerve blocks utilizing the bupivacaine with epinephrine. Further infiltration was then achieved utilizing the lidocaine with epinephrine. A 15 blade incision was then made from the distal of number 18 and extended to the distal of #32.  A surgical flap was then carefully reflected.  The lower teeth were then subluxated with a series of straight elevators.  A surgical handpiece and bur copious amounts sterile water were then used to remove buccal and interseptal bone around tooth numbers 20, 22, 27, 28, 29, and 32.  Lower teeth were then again subluxated with a series straight elevators.  Tooth numbers 20, 22, 23, 24, 25, 26, 27, 28, 29, and 32 were then removed with a 151 forceps without complications. Alveoloplasty was then performed utilizing a rongeurs and bone file to help achieve primary closure. The tissues were approximated and trimmed appropriately. The surgical sites were then irrigated with copious amounts of sterile saline. A piece of Surgifoam was placed extraction socket #32.  The mandibular left surgical site was then closed from the distal of 18 and extended to the mesial #24 utilizing 3-0 chromic gut suture in a continuous interrupted suture technique x1.  The mandibular right surgical site was then closed from distal #32 and extended the mesial #25 utilizing 3-0 chromic gut suture in a continuous rapid suture technique x1.  3 interrupted sutures were then placed to further close surgical  site in the area of #20-22 utilizing 4-0 chromic gut material.     At this point time, the entire mouth was irrigated with copious amounts of sterile saline. The patient was examined for complications, seeing none, the dental medicine procedure was deemed to be complete. The throat pack was removed at this time. An oral airway was then placed at the request of the anesthesia team. A series of 4 x 4 gauze moistened with Amicar 5% rinse were placed in the mouth to aid hemostasis. The patient was then handed over to the anesthesia team for final disposition. After an appropriate amount of time, the patient was extubated and taken to the postanesthsia care unit in good condition. All counts were correct for the dental medicine procedure.  The patient is to continue the Amicar 5% rinse postoperatively.  Patient is to rinse with 10 mL's every hour for the next 10 hours and a swish and spit manner.  The patient is to use Percocet pain medication as prescribed.  PMP aware was reviewed.  Patient is to return to Dental Medicine for evaluation for suture removal in 7 to 10 days.  Patient is to proceed with heart valve surgery with Dr. Cornelius Moras at his discretion barring any complications from the dental surgery.   Charlynne Pander, DDS.

## 2017-12-07 NOTE — Anesthesia Postprocedure Evaluation (Signed)
Anesthesia Post Note  Patient: Gloria James  Procedure(s) Performed: Extraction of tooth #'s 4-6, 8-12, 20,22-29 and 32 with alveoloplasty (N/A )     Patient location during evaluation: PACU Anesthesia Type: General Level of consciousness: awake and alert Pain management: pain level controlled Vital Signs Assessment: post-procedure vital signs reviewed and stable Respiratory status: spontaneous breathing, nonlabored ventilation, respiratory function stable and patient connected to nasal cannula oxygen Cardiovascular status: blood pressure returned to baseline and stable Postop Assessment: no apparent nausea or vomiting Anesthetic complications: no    Last Vitals:  Vitals:   12/06/17 1020 12/06/17 1035  BP: 119/86 123/85  Pulse: 94   Resp: 16 16  Temp: (!) 36.1 C   SpO2: 95% 95%    Last Pain:  Vitals:   12/06/17 1035  TempSrc:   PainSc: 3                  Malaney Mcbean

## 2017-12-08 ENCOUNTER — Encounter (HOSPITAL_COMMUNITY): Payer: Self-pay | Admitting: Dentistry

## 2017-12-12 ENCOUNTER — Other Ambulatory Visit: Payer: Self-pay | Admitting: Thoracic Surgery (Cardiothoracic Vascular Surgery)

## 2017-12-14 ENCOUNTER — Ambulatory Visit (HOSPITAL_COMMUNITY): Payer: Medicaid - Dental | Admitting: Dentistry

## 2017-12-14 ENCOUNTER — Encounter (HOSPITAL_COMMUNITY): Payer: Self-pay | Admitting: Dentistry

## 2017-12-14 VITALS — BP 121/71 | HR 73 | Temp 98.8°F

## 2017-12-14 DIAGNOSIS — Z01818 Encounter for other preprocedural examination: Secondary | ICD-10-CM

## 2017-12-14 DIAGNOSIS — K0820 Unspecified atrophy of edentulous alveolar ridge: Secondary | ICD-10-CM

## 2017-12-14 DIAGNOSIS — I052 Rheumatic mitral stenosis with insufficiency: Secondary | ICD-10-CM

## 2017-12-14 DIAGNOSIS — K08109 Complete loss of teeth, unspecified cause, unspecified class: Secondary | ICD-10-CM

## 2017-12-14 DIAGNOSIS — K08199 Complete loss of teeth due to other specified cause, unspecified class: Secondary | ICD-10-CM

## 2017-12-14 NOTE — Patient Instructions (Signed)
PLAN: 1. Continue salt water rinses as needed to aid healing. 2. Advance diet as tolerated. 3. Follow-up with the general dentist of her choice for fabrication of upper and lower complete dentures after adequate healing and once medically stable from the anticipated heart valve surgery. 4. Patient is now cleared for heart valve surgery with Dr. Cornelius Moras to be scheduled at his discretion.   Charlynne Pander, DDS

## 2017-12-14 NOTE — Progress Notes (Signed)
POST OPERATIVE NOTE:  12/14/2017 Gloria James [id number]  VITALS: BP 121/71   Pulse 73   Temp 98.8 F (37.1 C)   LABS:  Lab Results  Component Value Date   WBC 5.5 12/05/2017   HGB 12.5 12/05/2017   HCT 39.4 12/05/2017   MCV 83.1 12/05/2017   PLT 227 12/05/2017   BMET    Component Value Date/Time   NA 142 12/05/2017 1025   K 3.4 (L) 12/05/2017 1025   CL 107 12/05/2017 1025   CO2 26 12/05/2017 1025   GLUCOSE 81 12/05/2017 1025   BUN 11 12/05/2017 1025   CREATININE 0.86 12/05/2017 1025   CALCIUM 9.1 12/05/2017 1025   GFRNONAA >60 12/05/2017 1025   GFRAA >60 12/05/2017 1025    Lab Results  Component Value Date   INR 1.05 11/03/2017   No results found for: PTT   Valora L Spitzley is status post extraction of remaining teeth with alveoloplasty in the operating room on 12/06/2017. The patient now presents for evaluation of healing and suture removal as needed.  SUBJECTIVE: Patient with minimal complaints from dental extraction sites. Extractions ites are sore int he morning by patient report. Patient indicates that stitches still remain. She is using salt water rinses daily. Patient is brushing her tongue daily.  EXAM: There is no sign of infection, heme, or ooze. Sutures are loosely intact. Patient is healing in by generalized primary closure. Patient is now completely edentulous. There is atrophy of the edentulous alveolar ridges.  PROCEDURE: The patient was given a chlorhexidine gluconate rinse for 30 seconds. Sutures were then removed without complication. Patient tolerated the procedure well.  ASSESSMENT: Post operative course is consistent with dental procedures performed in the operating with general anesthesia. Loss of teeth in the extraction Patient is now completely edentulous. There is atrophy of the edentulous alveolar ridges.  PLAN: 1. Continue salt water rinses as needed to aid healing. 2. Advance diet as tolerated. 3. Follow-up with the general  dentist of her choice for fabrication of upper and lower complete dentures after adequate healing and once medically stable from the anticipated heart valve surgery. 4. Patient is now cleared for heart valve surgery with Dr. Cornelius Moras to be scheduled at his discretion.   Charlynne Pander, DDS

## 2017-12-15 NOTE — Pre-Procedure Instructions (Signed)
Gloria James  12/15/2017      [facility] - [place], [place] - [address] [place] [address] Phone: [phone] Fax: [phone]  [facility] - [place], [place] - [address] [place] [address] Phone: [phone] Fax: [phone]    Your procedure is scheduled on June 27  Report to [facility] Admitting at 0530 A.M.  Call this number if you have problems the morning of surgery:  [phone]   Remember:  NOTHING TO EAT OR DRINK AFTER MIDNIGHT    Take these medicines the morning of surgery with A SIP OF WATER  albuterol (PROAIR HFA ALPRAZolam (XANAX) fluticasone (FLONASE) oxyCODONE-acetaminophen (PERCOCET  7 days prior to surgery STOP taking any Aspirin(unless otherwise instructed by your surgeon), Aleve, Naproxen, Ibuprofen, Motrin, Advil, Goody's, BC's, all herbal medications, fish oil, and all vitamins     Do not wear jewelry, make-up or nail polish.  Do not wear lotions, powders, or perfumes, or deodorant.  Do not shave 48 hours prior to surgery.   Do not bring valuables to the hospital.  [facility] is not responsible for any belongings or valuables.  Contacts, dentures or bridgework may not be worn into surgery.  Leave your suitcase in the car.  After surgery it may be brought to your room.  For patients admitted to the hospital, discharge time will be determined by your treatment team.  Patients discharged the day of surgery will not be allowed to drive home.    Special instructions:   [place]- Preparing For Surgery  Before surgery, you can play an important role. Because skin is not sterile, your skin needs to be as free of germs as possible. You can reduce the number of germs on your skin by washing with CHG (chlorahexidine gluconate) Soap before surgery.  CHG is an antiseptic cleaner which kills germs and bonds with the skin to continue killing germs even after washing.     Oral Hygiene is also important to reduce your risk of infection.  Remember - BRUSH YOUR TEETH THE MORNING OF SURGERY WITH YOUR REGULAR TOOTHPASTE  Please do not use if you have an allergy to CHG or antibacterial soaps. If your skin becomes reddened/irritated stop using the CHG.  Do not shave (including legs and underarms) for at least 48 hours prior to first CHG shower. It is OK to shave your face.  Please follow these instructions carefully.   1. Shower the NIGHT BEFORE SURGERY and the MORNING OF SURGERY with CHG.   2. If you chose to wash your hair, wash your hair first as usual with your normal shampoo.  3. After you shampoo, rinse your hair and body thoroughly to remove the shampoo.  4. Use CHG as you would any other liquid soap. You can apply CHG directly to the skin and wash gently with a scrungie or a clean washcloth.   5. Apply the CHG Soap to your body ONLY FROM THE NECK DOWN.  Do not use on open wounds or open sores. Avoid contact with your eyes, ears, mouth and genitals (private parts). Wash Face and genitals (private parts)  with your normal soap.  6. Wash thoroughly, paying special attention to the area where your surgery will be performed.  7. Thoroughly rinse your body with warm water from the neck down.  8. DO NOT shower/wash with your normal soap after  using and rinsing off the CHG Soap.  9. Pat yourself dry with a CLEAN TOWEL.  10. Wear CLEAN PAJAMAS to bed the night before surgery, wear comfortable clothes the morning of surgery  11. Place CLEAN SHEETS on your bed the night of your first shower and DO NOT SLEEP WITH PETS.    Day of Surgery:  Do not apply any deodorants/lotions.  Please wear clean clothes to the hospital/surgery center.   Remember to brush your teeth WITH YOUR REGULAR TOOTHPASTE.    Please read over the following fact sheets that you were given.

## 2017-12-19 ENCOUNTER — Encounter (HOSPITAL_COMMUNITY)
Admission: RE | Admit: 2017-12-19 | Discharge: 2017-12-19 | Disposition: A | Discharge status: Home / Self Care | Payer: Medicaid Other | Source: Ambulatory Visit | Attending: Thoracic Surgery (Cardiothoracic Vascular Surgery) | Admitting: Thoracic Surgery (Cardiothoracic Vascular Surgery)

## 2017-12-19 ENCOUNTER — Encounter: Payer: Self-pay | Admitting: Thoracic Surgery (Cardiothoracic Vascular Surgery)

## 2017-12-19 ENCOUNTER — Ambulatory Visit
Admission: RE | Admit: 2017-12-19 | Discharge: 2017-12-19 | Disposition: A | Discharge status: Home / Self Care | Payer: Medicaid Other | Source: Ambulatory Visit | Attending: Thoracic Surgery (Cardiothoracic Vascular Surgery) | Admitting: Thoracic Surgery (Cardiothoracic Vascular Surgery)

## 2017-12-19 ENCOUNTER — Ambulatory Visit: Payer: Medicaid Other | Admitting: Thoracic Surgery (Cardiothoracic Vascular Surgery)

## 2017-12-19 ENCOUNTER — Ambulatory Visit (HOSPITAL_COMMUNITY)
Admission: RE | Admit: 2017-12-19 | Discharge: 2017-12-19 | Disposition: A | Discharge status: Home / Self Care | Payer: Medicaid Other | Source: Ambulatory Visit | Attending: Thoracic Surgery (Cardiothoracic Vascular Surgery) | Admitting: Thoracic Surgery (Cardiothoracic Vascular Surgery)

## 2017-12-19 ENCOUNTER — Ambulatory Visit (HOSPITAL_BASED_OUTPATIENT_CLINIC_OR_DEPARTMENT_OTHER)
Admission: RE | Admit: 2017-12-19 | Discharge: 2017-12-19 | Disposition: A | Discharge status: Home / Self Care | Payer: Medicaid Other | Source: Ambulatory Visit | Attending: Thoracic Surgery (Cardiothoracic Vascular Surgery) | Admitting: Thoracic Surgery (Cardiothoracic Vascular Surgery)

## 2017-12-19 VITALS — BP 110/66 | HR 68 | Resp 20 | Ht 68.0 in | Wt 225.0 lb

## 2017-12-19 DIAGNOSIS — I712 Thoracic aortic aneurysm, without rupture, unspecified: Secondary | ICD-10-CM

## 2017-12-19 DIAGNOSIS — I7409 Other arterial embolism and thrombosis of abdominal aorta: Secondary | ICD-10-CM

## 2017-12-19 DIAGNOSIS — I071 Rheumatic tricuspid insufficiency: Secondary | ICD-10-CM | POA: Diagnosis not present

## 2017-12-19 DIAGNOSIS — J42 Unspecified chronic bronchitis: Secondary | ICD-10-CM | POA: Diagnosis not present

## 2017-12-19 DIAGNOSIS — Z01818 Encounter for other preprocedural examination: Secondary | ICD-10-CM

## 2017-12-19 DIAGNOSIS — I050 Rheumatic mitral stenosis: Secondary | ICD-10-CM

## 2017-12-19 DIAGNOSIS — I340 Nonrheumatic mitral (valve) insufficiency: Secondary | ICD-10-CM | POA: Diagnosis not present

## 2017-12-19 DIAGNOSIS — I052 Rheumatic mitral stenosis with insufficiency: Secondary | ICD-10-CM | POA: Diagnosis not present

## 2017-12-19 DIAGNOSIS — I5032 Chronic diastolic (congestive) heart failure: Secondary | ICD-10-CM | POA: Insufficient documentation

## 2017-12-19 DIAGNOSIS — D649 Anemia, unspecified: Secondary | ICD-10-CM | POA: Insufficient documentation

## 2017-12-19 DIAGNOSIS — Z87891 Personal history of nicotine dependence: Secondary | ICD-10-CM | POA: Diagnosis not present

## 2017-12-19 DIAGNOSIS — D509 Iron deficiency anemia, unspecified: Secondary | ICD-10-CM | POA: Diagnosis not present

## 2017-12-19 LAB — PULMONARY FUNCTION TEST
DL/VA % PRED: 74 %
DL/VA: 3.92 ml/min/mmHg/L
DLCO cor % pred: 61 %
DLCO cor: 18.22 ml/min/mmHg
DLCO unc % pred: 57 %
DLCO unc: 16.93 ml/min/mmHg
FEF 25-75 PRE: 1.78 L/s
FEF 25-75 Post: 0.55 L/sec
FEF2575-%CHANGE-POST: -68 %
FEF2575-%Pred-Post: 17 %
FEF2575-%Pred-Pre: 54 %
FEV1-%Change-Post: -23 %
FEV1-%PRED-PRE: 72 %
FEV1-%Pred-Post: 55 %
FEV1-PRE: 2.44 L
FEV1-Post: 1.86 L
FEV1FVC-%Change-Post: -12 %
FEV1FVC-%Pred-Pre: 85 %
FEV6-%CHANGE-POST: -13 %
FEV6-%PRED-POST: 72 %
FEV6-%Pred-Pre: 84 %
FEV6-PRE: 3.45 L
FEV6-Post: 2.97 L
FEV6FVC-%Change-Post: -1 %
FEV6FVC-%PRED-PRE: 101 %
FEV6FVC-%Pred-Post: 99 %
FVC-%CHANGE-POST: -12 %
FVC-%PRED-POST: 73 %
FVC-%Pred-Pre: 83 %
FVC-POST: 3.04 L
FVC-Pre: 3.47 L
POST FEV1/FVC RATIO: 61 %
PRE FEV6/FVC RATIO: 99 %
Post FEV6/FVC ratio: 98 %
Pre FEV1/FVC ratio: 70 %
RV % PRED: 103 %
RV: 1.91 L
TLC % pred: 96 %
TLC: 5.46 L

## 2017-12-19 LAB — COMPREHENSIVE METABOLIC PANEL
ALT: 14 U/L (ref 14–54)
ANION GAP: 10 (ref 5–15)
AST: 16 U/L (ref 15–41)
Albumin: 3.7 g/dL (ref 3.5–5.0)
Alkaline Phosphatase: 50 U/L (ref 38–126)
BUN: 7 mg/dL (ref 6–20)
CO2: 22 mmol/L (ref 22–32)
CREATININE: 0.92 mg/dL (ref 0.44–1.00)
Calcium: 8.7 mg/dL — ABNORMAL LOW (ref 8.9–10.3)
Chloride: 107 mmol/L (ref 101–111)
GFR calc non Af Amer: >60 mL/min (ref >60)
Glucose, Bld: 123 mg/dL — ABNORMAL HIGH (ref 65–99)
POTASSIUM: 3.1 mmol/L — AB (ref 3.5–5.1)
Sodium: 139 mmol/L (ref 135–145)
Total Bilirubin: 0.5 mg/dL (ref 0.3–1.2)
Total Protein: 6.8 g/dL (ref 6.5–8.1)

## 2017-12-19 LAB — HEMOGLOBIN A1C
HEMOGLOBIN A1C: 5.4 % (ref 4.8–5.6)
MEAN PLASMA GLUCOSE: 108.28 mg/dL

## 2017-12-19 LAB — URINALYSIS, ROUTINE W REFLEX MICROSCOPIC
Bilirubin Urine: NEGATIVE
Glucose, UA: NEGATIVE mg/dL
HGB URINE DIPSTICK: NEGATIVE
Ketones, ur: NEGATIVE mg/dL
Leukocytes, UA: NEGATIVE
Nitrite: NEGATIVE
PH: 6 (ref 5.0–8.0)
Protein, ur: NEGATIVE mg/dL

## 2017-12-19 LAB — BLOOD GAS, ARTERIAL
Acid-Base Excess: 1.1 mmol/L (ref 0.0–2.0)
Bicarbonate: 24.5 mmol/L (ref 20.0–28.0)
DRAWN BY: 470591
FIO2: 21
O2 Saturation: 98.9 %
PCO2 ART: 34.8 mmHg (ref 32.0–48.0)
PH ART: 7.461 — AB (ref 7.350–7.450)
Patient temperature: 98.6
pO2, Arterial: 138 mmHg — ABNORMAL HIGH (ref 83.0–108.0)

## 2017-12-19 LAB — CBC
HCT: 35.5 % — ABNORMAL LOW (ref 36.0–46.0)
HEMOGLOBIN: 11.3 g/dL — AB (ref 12.0–15.0)
MCH: 26.1 pg (ref 26.0–34.0)
MCHC: 31.8 g/dL (ref 30.0–36.0)
MCV: 82 fL (ref 78.0–100.0)
Platelets: 201 10*3/uL (ref 150–400)
RBC: 4.33 MIL/uL (ref 3.87–5.11)
RDW: 13.1 % (ref 11.5–15.5)
WBC: 5 10*3/uL (ref 4.0–10.5)

## 2017-12-19 LAB — PROTIME-INR
INR: 1.08
PROTHROMBIN TIME: 13.9 s (ref 11.4–15.2)

## 2017-12-19 LAB — SURGICAL PCR SCREEN
MRSA, PCR: NEGATIVE
Staphylococcus aureus: NEGATIVE

## 2017-12-19 LAB — ABO/RH: ABO/RH(D): O POS

## 2017-12-19 LAB — TYPE AND SCREEN
ABO/RH(D): O POS
ANTIBODY SCREEN: NEGATIVE

## 2017-12-19 LAB — APTT: aPTT: 35 seconds (ref 24–36)

## 2017-12-19 MED ORDER — ALBUTEROL SULFATE (2.5 MG/3ML) 0.083% IN NEBU
2.5000 mg | INHALATION_SOLUTION | Freq: Once | RESPIRATORY_TRACT | Status: AC
  Administered 2017-12-19: 2.5 mg via RESPIRATORY_TRACT

## 2017-12-19 MED ORDER — IOPAMIDOL (ISOVUE-370) INJECTION 76%
75.0000 mL | Freq: Once | INTRAVENOUS | Status: AC | PRN
  Administered 2017-12-19: 75 mL via INTRAVENOUS

## 2017-12-19 NOTE — Patient Instructions (Signed)
   Continue taking all current medications without change through the day before surgery.  Have nothing to eat or drink after midnight the night before surgery.  On the morning of surgery do not take any medications   

## 2017-12-19 NOTE — Progress Notes (Signed)
[address]       [name] [address]             [phone]     CARDIOTHORACIC SURGERY OFFICE NOTE  Referring Provider is [name], MD  Primary Cardiologist is Branch, Dorothe Pea, MD PCP is Sasser, Clarene Critchley, MD   HPI:  Patient is a 42 year old obese female with rheumatic mitral valve disease who returns the office today with tentative plans to proceed with minimally invasive mitral valve replacement later this week.  She was originally seen in consultation on Nov 17, 2017.  Since then she underwent dental consultation and teeth extraction by Dr. [name].  She reports no new problems or complaints and she is eager to proceed with surgery as previously planned.   Current Outpatient Medications  Medication Sig Dispense Refill  . albuterol (PROAIR HFA) 108 (90 Base) MCG/ACT inhaler Inhale 2 puffs into the lungs every 4 (four) hours as needed for wheezing or shortness of breath.     . ALPRAZolam (XANAX) 0.5 MG tablet Take 0.5 mg by mouth 4 (four) times daily as needed for anxiety.     . fluticasone (FLONASE) 50 MCG/ACT nasal spray Place 2 sprays into both nostrils daily as needed for allergies.     . furosemide (LASIX) 40 MG tablet Take 1.5 tablets (60 mg total) by mouth daily. 45 tablet 3  . sertraline (ZOLOFT) 100 MG tablet Take 200 mg by mouth daily.     . traZODone (DESYREL) 100 MG tablet Take 200 mg by mouth at bedtime.     Marland Kitchen oxyCODONE-acetaminophen (PERCOCET) 5-325 MG tablet Take one or two tablets by mouth every 6 hours as needed for pain. (Patient not taking: Reported on 12/19/2017) 40 tablet 0   No current facility-administered medications for this visit.       Physical Exam:   BP 110/66   Pulse 68   Resp 20   Ht 5\' 8"  (1.727 m)   Wt 225 lb (102.1 kg)   BMI 34.21 kg/m   General:  Obese but well-appearing  Chest:   Clear to auscultation  CV:   Regular rate and rhythm  Incisions:  n/a  Abdomen:  Soft nontender  Extremities:  Warm  and well-perfused  Diagnostic Tests:  CHEST - 2 VIEW  COMPARISON:  PA and lateral chest x-ray of October 16, 2017  FINDINGS: The lungs are well-expanded. There is no focal infiltrate. There is no pleural effusion. The interstitial markings are coarse though stable. The heart and pulmonary vascularity are normal. The mediastinum is normal in width. The bony thorax exhibits no acute abnormality.  IMPRESSION: Mild chronic bronchitic-smoking related changes. No acute cardiopulmonary abnormality.   Electronically Signed   By: David  [place] M.D.   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  Mitral valve has classical features of rheumatic disease with leaflet thickening, restricted leaflet mobility, and some foreshortening of the subvalvular apparatus.  The posterior leaflet is very severely restricted and nearly fixed.  There is a broad jet of regurgitation with flow reversal in the pulmonary veins.  There is moderate left atrial enlargement.  Left ventricular size and function appear normal.  The aortic valve appears normal.  Right ventricular size and function is normal.  The tricuspid annulus does not appear to be dilated.  There is mild tricuspid regurgitation.  Diagnostic cardiac catheterization revealed  normal coronary artery anatomy with no significant coronary artery disease.  There was mild pulmonary hypertension and large V waves consistent with severe mitral regurgitation.  I agree the patient needs mitral valve repair or replacement.  Based upon review of the patient's transesophageal echocardiogram I feel that the likelihood of durable valve repair is probably relatively low.    Plan:  I have again reviewed the indications, risk, and potential benefits of mitral valve replacement with the patient in the office today.  Expectations for her postoperative  convalescence have been discussed.  We again discussed the possibility of replacing the mitral valve using a mechanical prosthesis with the attendant need for long-term anticoagulation versus the alternative of replacing it using a bioprosthetic tissue valve with its potential for late structural valve deterioration and failure, depending upon the patient's longevity.  The patient specifically requests that if the mitral valve must be replaced that it be done using a mechanical valve.     We discussed the presence of some tricuspid regurgitation and she understands that we will assess this at the time of surgery and consider concomitant tricuspid valve repair if necessary.  Alternative surgical approaches have been discussed including a comparison between conventional sternotomy and minimally-invasive techniques.   The patient understands and accepts all potential risks of surgery including but not limited to risk of death, stroke or other neurologic complication, myocardial infarction, congestive heart failure, respiratory failure, renal failure, bleeding requiring transfusion and/or reexploration, arrhythmia, infection or other wound complications, pneumonia, pleural and/or pericardial effusion, pulmonary embolus, aortic dissection or other major vascular complication, or delayed complications related to valve repair or replacement including but not limited to structural valve deterioration and failure, thrombosis, embolization, endocarditis, or paravalvular leak.  Specific risks potentially related to the minimally-invasive approach were discussed at length, including but not limited to risk of conversion to full or partial sternotomy, aortic dissection or other major vascular complication, unilateral acute lung injury or pulmonary edema, phrenic nerve dysfunction or paralysis, rib fracture, chronic pain, lung hernia, or lymphocele. All of her questions have been answered.   I spent in excess of 15 minutes  during the conduct of this office consultation and >50% of this time involved direct face-to-face encounter with the patient for counseling and/or coordination of their care.   Salvatore Decent. Cornelius Moras, MD 12/19/2017 3:02 PM

## 2017-12-19 NOTE — Progress Notes (Signed)
Pre-op Cardiac Surgery  Carotid Findings:  Bilateral - There is no evidence of significant ICA stenosis  Upper Extremity Right Left  Brachial Pressures 116 Triphasic 118 triphasic  Radial Waveforms Triphasic Triphasic  Ulnar Waveforms Triphasic Triphasic  Palmar Arch (Allen's Test) Normal Abnormal   Findings:  Right Doppler waveforms remained normal with both radial and ulnar compressions. Left Doppler waveforms remained normal with radial compression and diminished 50% with ulnar compressions

## 2017-12-20 MED ORDER — GLUTARALDEHYDE 0.625% SOAKING SOLUTION
TOPICAL | Status: DC
  Filled 2017-12-20: qty 50

## 2017-12-21 MED ORDER — DOPAMINE-DEXTROSE 3.2-5 MG/ML-% IV SOLN
0.0000 ug/kg/min | INTRAVENOUS | Status: DC
  Filled 2017-12-21: qty 250

## 2017-12-21 MED ORDER — MILRINONE LACTATE IN DEXTROSE 20-5 MG/100ML-% IV SOLN
0.1250 ug/kg/min | INTRAVENOUS | Status: DC
  Filled 2017-12-21: qty 100

## 2017-12-21 MED ORDER — SODIUM CHLORIDE 0.9 % IV SOLN
30.0000 ug/min | INTRAVENOUS | Status: AC
  Administered 2017-12-22: 25 ug/min via INTRAVENOUS
  Filled 2017-12-21: qty 2

## 2017-12-21 MED ORDER — POTASSIUM CHLORIDE 2 MEQ/ML IV SOLN
80.0000 meq | INTRAVENOUS | Status: DC
  Filled 2017-12-21: qty 40

## 2017-12-21 MED ORDER — CEFUROXIME SODIUM 750 MG IJ SOLR
750.0000 mg | INTRAMUSCULAR | Status: DC
  Filled 2017-12-21: qty 750

## 2017-12-21 MED ORDER — KENNESTONE BLOOD CARDIOPLEGIA VIAL
13.0000 mL | Freq: Once | Status: DC
  Filled 2017-12-21: qty 13

## 2017-12-21 MED ORDER — TRANEXAMIC ACID 1000 MG/10ML IV SOLN
1.5000 mg/kg/h | INTRAVENOUS | Status: AC
  Administered 2017-12-22: 1.5 mg/kg/h via INTRAVENOUS
  Filled 2017-12-21: qty 25

## 2017-12-21 MED ORDER — TRANEXAMIC ACID (OHS) PUMP PRIME SOLUTION
2.0000 mg/kg | INTRAVENOUS | Status: DC
  Filled 2017-12-21: qty 2.04

## 2017-12-21 MED ORDER — VANCOMYCIN HCL 1000 MG IV SOLR
INTRAVENOUS | Status: AC
  Administered 2017-12-22: 1000 mL
  Filled 2017-12-21: qty 1000

## 2017-12-21 MED ORDER — PLASMA-LYTE 148 IV SOLN
INTRAVENOUS | Status: DC
  Filled 2017-12-21: qty 2.5

## 2017-12-21 MED ORDER — KENNESTONE BLOOD CARDIOPLEGIA (KBC) MANNITOL SYRINGE (20%, 32ML)
32.0000 mL | Freq: Once | INTRAVENOUS | Status: DC
  Filled 2017-12-21: qty 32

## 2017-12-21 MED ORDER — NITROGLYCERIN IN D5W 200-5 MCG/ML-% IV SOLN
2.0000 ug/min | INTRAVENOUS | Status: DC
  Filled 2017-12-21: qty 250

## 2017-12-21 MED ORDER — VANCOMYCIN HCL 10 G IV SOLR
1500.0000 mg | INTRAVENOUS | Status: AC
  Administered 2017-12-22: 1500 mg via INTRAVENOUS
  Filled 2017-12-21: qty 1500

## 2017-12-21 MED ORDER — KENNESTONE BLOOD CARDIOPLEGIA (KBC) MANNITOL SYRINGE (20%, 32ML)
32.0000 mL | Freq: Once | INTRAVENOUS | Status: DC
Start: 2017-12-22 — End: 2017-12-22
  Filled 2017-12-21: qty 32

## 2017-12-21 MED ORDER — MAGNESIUM SULFATE 50 % IJ SOLN
40.0000 meq | INTRAMUSCULAR | Status: DC
  Filled 2017-12-21: qty 9.85

## 2017-12-21 MED ORDER — DEXTROSE 5 % IV SOLN
0.0000 ug/min | INTRAVENOUS | Status: DC
  Filled 2017-12-21: qty 4

## 2017-12-21 MED ORDER — SODIUM CHLORIDE 0.9 % IV SOLN
1.5000 g | INTRAVENOUS | Status: AC
  Administered 2017-12-22: 1.5 g via INTRAVENOUS
  Filled 2017-12-21 (×2): qty 1.5

## 2017-12-21 MED ORDER — HEPARIN SODIUM (PORCINE) 1000 UNIT/ML IJ SOLN
INTRAMUSCULAR | Status: DC
  Filled 2017-12-21: qty 30

## 2017-12-21 MED ORDER — DEXMEDETOMIDINE HCL IN NACL 400 MCG/100ML IV SOLN
0.1000 ug/kg/h | INTRAVENOUS | Status: AC
  Administered 2017-12-22: .7 ug/kg/h via INTRAVENOUS
  Filled 2017-12-21: qty 100

## 2017-12-21 MED ORDER — SODIUM CHLORIDE 0.9 % IV SOLN
INTRAVENOUS | Status: AC
  Administered 2017-12-22: 1 [IU]/h via INTRAVENOUS
  Filled 2017-12-21: qty 1

## 2017-12-21 MED ORDER — TRANEXAMIC ACID (OHS) BOLUS VIA INFUSION
15.0000 mg/kg | INTRAVENOUS | Status: AC
  Administered 2017-12-22: 1531.5 mg via INTRAVENOUS
  Filled 2017-12-21: qty 1532

## 2017-12-21 NOTE — Anesthesia Preprocedure Evaluation (Addendum)
Anesthesia Evaluation  Patient identified by MRN, date of birth, ID band Patient awake    Reviewed: Allergy & Precautions, H&P , NPO status , Patient's Chart, lab work & pertinent test results  History of Anesthesia Complications (+) PONV  Airway Mallampati: II  TM Distance: >3 FB Neck ROM: Full    Dental no notable dental hx. (+) Edentulous Upper, Edentulous Lower, Dental Advisory Given   Pulmonary shortness of breath, former smoker,    Pulmonary exam normal breath sounds clear to auscultation       Cardiovascular Exercise Tolerance: Good +CHF  + Valvular Problems/Murmurs MR  Rhythm:Regular Rate:Normal     Neuro/Psych Anxiety Depression negative neurological ROS     GI/Hepatic negative GI ROS, Neg liver ROS,   Endo/Other  negative endocrine ROS  Renal/GU negative Renal ROS  negative genitourinary   Musculoskeletal   Abdominal   Peds  Hematology negative hematology ROS (+)   Anesthesia Other Findings   Reproductive/Obstetrics negative OB ROS                            Anesthesia Physical Anesthesia Plan  ASA: IV  Anesthesia Plan: General   Post-op Pain Management:    Induction: Intravenous  PONV Risk Score and Plan: 4 or greater and Treatment may vary due to age or medical condition and Midazolam  Airway Management Planned: Double Lumen EBT  Additional Equipment: Arterial line, CVP, PA Cath, TEE, 3D TEE and Ultrasound Guidance Line Placement  Intra-op Plan:   Post-operative Plan: Post-operative intubation/ventilation  Informed Consent: I have reviewed the patients History and Physical, chart, labs and discussed the procedure including the risks, benefits and alternatives for the proposed anesthesia with the patient or authorized representative who has indicated his/her understanding and acceptance.   Dental advisory given  Plan Discussed with: CRNA  Anesthesia Plan  Comments:         Anesthesia Quick Evaluation

## 2017-12-22 ENCOUNTER — Inpatient Hospital Stay (HOSPITAL_COMMUNITY): Payer: Medicaid Other | Admitting: Vascular Surgery

## 2017-12-22 ENCOUNTER — Inpatient Hospital Stay (HOSPITAL_COMMUNITY): Payer: Medicaid Other

## 2017-12-22 ENCOUNTER — Encounter (HOSPITAL_COMMUNITY)
Admission: RE | Disposition: A | Discharge status: Home / Self Care | Payer: Self-pay | Source: Home / Self Care | Attending: Thoracic Surgery (Cardiothoracic Vascular Surgery)

## 2017-12-22 ENCOUNTER — Inpatient Hospital Stay (HOSPITAL_COMMUNITY)
Admission: RE | Admit: 2017-12-22 | Discharge: 2017-12-28 | DRG: 220 | Disposition: A | Discharge status: Home / Self Care | Payer: Medicaid Other | Attending: Thoracic Surgery (Cardiothoracic Vascular Surgery) | Admitting: Thoracic Surgery (Cardiothoracic Vascular Surgery)

## 2017-12-22 ENCOUNTER — Encounter (HOSPITAL_COMMUNITY): Payer: Self-pay

## 2017-12-22 DIAGNOSIS — Z79899 Other long term (current) drug therapy: Secondary | ICD-10-CM | POA: Diagnosis not present

## 2017-12-22 DIAGNOSIS — Z6835 Body mass index (BMI) 35.0-35.9, adult: Secondary | ICD-10-CM | POA: Diagnosis not present

## 2017-12-22 DIAGNOSIS — I071 Rheumatic tricuspid insufficiency: Secondary | ICD-10-CM | POA: Diagnosis present

## 2017-12-22 DIAGNOSIS — I340 Nonrheumatic mitral (valve) insufficiency: Secondary | ICD-10-CM

## 2017-12-22 DIAGNOSIS — I272 Pulmonary hypertension, unspecified: Secondary | ICD-10-CM | POA: Diagnosis present

## 2017-12-22 DIAGNOSIS — Z87442 Personal history of urinary calculi: Secondary | ICD-10-CM

## 2017-12-22 DIAGNOSIS — R791 Abnormal coagulation profile: Secondary | ICD-10-CM | POA: Diagnosis not present

## 2017-12-22 DIAGNOSIS — E669 Obesity, unspecified: Secondary | ICD-10-CM | POA: Diagnosis present

## 2017-12-22 DIAGNOSIS — I052 Rheumatic mitral stenosis with insufficiency: Secondary | ICD-10-CM | POA: Diagnosis present

## 2017-12-22 DIAGNOSIS — F410 Panic disorder [episodic paroxysmal anxiety] without agoraphobia: Secondary | ICD-10-CM | POA: Diagnosis present

## 2017-12-22 DIAGNOSIS — F419 Anxiety disorder, unspecified: Secondary | ICD-10-CM | POA: Diagnosis present

## 2017-12-22 DIAGNOSIS — Z87891 Personal history of nicotine dependence: Secondary | ICD-10-CM | POA: Diagnosis not present

## 2017-12-22 DIAGNOSIS — Z8249 Family history of ischemic heart disease and other diseases of the circulatory system: Secondary | ICD-10-CM

## 2017-12-22 DIAGNOSIS — D62 Acute posthemorrhagic anemia: Secondary | ICD-10-CM | POA: Diagnosis not present

## 2017-12-22 DIAGNOSIS — F329 Major depressive disorder, single episode, unspecified: Secondary | ICD-10-CM | POA: Diagnosis present

## 2017-12-22 DIAGNOSIS — I459 Conduction disorder, unspecified: Secondary | ICD-10-CM | POA: Diagnosis present

## 2017-12-22 DIAGNOSIS — D649 Anemia, unspecified: Secondary | ICD-10-CM | POA: Diagnosis present

## 2017-12-22 DIAGNOSIS — J9811 Atelectasis: Secondary | ICD-10-CM | POA: Diagnosis not present

## 2017-12-22 DIAGNOSIS — Z954 Presence of other heart-valve replacement: Secondary | ICD-10-CM

## 2017-12-22 DIAGNOSIS — Z09 Encounter for follow-up examination after completed treatment for conditions other than malignant neoplasm: Secondary | ICD-10-CM

## 2017-12-22 DIAGNOSIS — I5032 Chronic diastolic (congestive) heart failure: Secondary | ICD-10-CM | POA: Diagnosis present

## 2017-12-22 HISTORY — PX: MITRAL VALVE REPAIR: SHX2039

## 2017-12-22 HISTORY — DX: Presence of other heart-valve replacement: Z95.4

## 2017-12-22 HISTORY — PX: TEE WITHOUT CARDIOVERSION: SHX5443

## 2017-12-22 LAB — CBC
HEMATOCRIT: 32.1 % — AB (ref 36.0–46.0)
HEMATOCRIT: 33.7 % — AB (ref 36.0–46.0)
HEMOGLOBIN: 10.5 g/dL — AB (ref 12.0–15.0)
Hemoglobin: 10 g/dL — ABNORMAL LOW (ref 12.0–15.0)
MCH: 26 pg (ref 26.0–34.0)
MCH: 26.3 pg (ref 26.0–34.0)
MCHC: 31.2 g/dL (ref 30.0–36.0)
MCHC: 31.2 g/dL (ref 30.0–36.0)
MCV: 83.4 fL (ref 78.0–100.0)
MCV: 84.5 fL (ref 78.0–100.0)
PLATELETS: 122 10*3/uL — AB (ref 150–400)
Platelets: 147 10*3/uL — ABNORMAL LOW (ref 150–400)
RBC: 3.85 MIL/uL — ABNORMAL LOW (ref 3.87–5.11)
RBC: 3.99 MIL/uL (ref 3.87–5.11)
RDW: 13.2 % (ref 11.5–15.5)
RDW: 13.2 % (ref 11.5–15.5)
WBC: 8.3 10*3/uL (ref 4.0–10.5)
WBC: 8.6 10*3/uL (ref 4.0–10.5)

## 2017-12-22 LAB — POCT I-STAT 3, ART BLOOD GAS (G3+)
ACID-BASE DEFICIT: 1 mmol/L (ref 0.0–2.0)
ACID-BASE DEFICIT: 5 mmol/L — AB (ref 0.0–2.0)
ACID-BASE DEFICIT: 6 mmol/L — AB (ref 0.0–2.0)
BICARBONATE: 24.8 mmol/L (ref 20.0–28.0)
BICARBONATE: 18.8 mmol/L — AB (ref 20.0–28.0)
Bicarbonate: 24.8 mmol/L (ref 20.0–28.0)
Bicarbonate: 20.5 mmol/L (ref 20.0–28.0)
O2 SAT: 94 %
O2 Saturation: 100 %
O2 Saturation: 95 %
O2 Saturation: 93 %
PCO2 ART: 42.7 mmHg (ref 32.0–48.0)
PCO2 ART: 45.4 mmHg (ref 32.0–48.0)
PH ART: 7.372 (ref 7.350–7.450)
PH ART: 7.342 — AB (ref 7.350–7.450)
PO2 ART: 78 mmHg — AB (ref 83.0–108.0)
TCO2: 26 mmol/L (ref 22–32)
TCO2: 26 mmol/L (ref 22–32)
TCO2: 22 mmol/L (ref 22–32)
TCO2: 20 mmol/L — ABNORMAL LOW (ref 22–32)
pCO2 arterial: 37.7 mmHg (ref 32.0–48.0)
pCO2 arterial: 34.6 mmHg (ref 32.0–48.0)
pH, Arterial: 7.346 — ABNORMAL LOW (ref 7.350–7.450)
pH, Arterial: 7.339 — ABNORMAL LOW (ref 7.350–7.450)
pO2, Arterial: 361 mmHg — ABNORMAL HIGH (ref 83.0–108.0)
pO2, Arterial: 75 mmHg — ABNORMAL LOW (ref 83.0–108.0)
pO2, Arterial: 71 mmHg — ABNORMAL LOW (ref 83.0–108.0)

## 2017-12-22 LAB — ECHO INTRAOPERATIVE TEE
Height: 68 in
Weight: 3600 oz

## 2017-12-22 LAB — POCT I-STAT, CHEM 8
BUN: 9 mg/dL (ref 6–20)
BUN: 7 mg/dL (ref 6–20)
BUN: 9 mg/dL (ref 6–20)
BUN: 7 mg/dL (ref 6–20)
BUN: 7 mg/dL (ref 6–20)
BUN: 8 mg/dL (ref 6–20)
BUN: 8 mg/dL (ref 6–20)
CALCIUM ION: 1.26 mmol/L (ref 1.15–1.40)
CALCIUM ION: 1.06 mmol/L — AB (ref 1.15–1.40)
CALCIUM ION: 1.1 mmol/L — AB (ref 1.15–1.40)
CHLORIDE: 103 mmol/L (ref 98–111)
CREATININE: 0.6 mg/dL (ref 0.44–1.00)
CREATININE: 0.6 mg/dL (ref 0.44–1.00)
CREATININE: 0.7 mg/dL (ref 0.44–1.00)
Calcium, Ion: 1.06 mmol/L — ABNORMAL LOW (ref 1.15–1.40)
Calcium, Ion: 1.24 mmol/L (ref 1.15–1.40)
Calcium, Ion: 1.11 mmol/L — ABNORMAL LOW (ref 1.15–1.40)
Calcium, Ion: 1.1 mmol/L — ABNORMAL LOW (ref 1.15–1.40)
Chloride: 103 mmol/L (ref 98–111)
Chloride: 101 mmol/L (ref 98–111)
Chloride: 104 mmol/L (ref 98–111)
Chloride: 105 mmol/L (ref 98–111)
Chloride: 105 mmol/L (ref 98–111)
Chloride: 107 mmol/L (ref 98–111)
Creatinine, Ser: 0.7 mg/dL (ref 0.44–1.00)
Creatinine, Ser: 0.7 mg/dL (ref 0.44–1.00)
Creatinine, Ser: 0.6 mg/dL (ref 0.44–1.00)
Creatinine, Ser: 0.7 mg/dL (ref 0.44–1.00)
GLUCOSE: 96 mg/dL (ref 70–99)
GLUCOSE: 156 mg/dL — AB (ref 70–99)
GLUCOSE: 158 mg/dL — AB (ref 70–99)
Glucose, Bld: 106 mg/dL — ABNORMAL HIGH (ref 70–99)
Glucose, Bld: 108 mg/dL — ABNORMAL HIGH (ref 70–99)
Glucose, Bld: 139 mg/dL — ABNORMAL HIGH (ref 70–99)
Glucose, Bld: 160 mg/dL — ABNORMAL HIGH (ref 70–99)
HCT: 28 % — ABNORMAL LOW (ref 36.0–46.0)
HCT: 33 % — ABNORMAL LOW (ref 36.0–46.0)
HCT: 26 % — ABNORMAL LOW (ref 36.0–46.0)
HCT: 30 % — ABNORMAL LOW (ref 36.0–46.0)
HEMATOCRIT: 35 % — AB (ref 36.0–46.0)
HEMATOCRIT: 26 % — AB (ref 36.0–46.0)
HEMATOCRIT: 29 % — AB (ref 36.0–46.0)
HEMOGLOBIN: 11.2 g/dL — AB (ref 12.0–15.0)
HEMOGLOBIN: 9.5 g/dL — AB (ref 12.0–15.0)
HEMOGLOBIN: 9.9 g/dL — AB (ref 12.0–15.0)
HEMOGLOBIN: 10.2 g/dL — AB (ref 12.0–15.0)
Hemoglobin: 11.9 g/dL — ABNORMAL LOW (ref 12.0–15.0)
Hemoglobin: 8.8 g/dL — ABNORMAL LOW (ref 12.0–15.0)
Hemoglobin: 8.8 g/dL — ABNORMAL LOW (ref 12.0–15.0)
POTASSIUM: 3.2 mmol/L — AB (ref 3.5–5.1)
POTASSIUM: 3.4 mmol/L — AB (ref 3.5–5.1)
POTASSIUM: 4.2 mmol/L (ref 3.5–5.1)
Potassium: 3.1 mmol/L — ABNORMAL LOW (ref 3.5–5.1)
Potassium: 3.8 mmol/L (ref 3.5–5.1)
Potassium: 3.3 mmol/L — ABNORMAL LOW (ref 3.5–5.1)
Potassium: 4.1 mmol/L (ref 3.5–5.1)
SODIUM: 140 mmol/L (ref 135–145)
SODIUM: 139 mmol/L (ref 135–145)
SODIUM: 140 mmol/L (ref 135–145)
SODIUM: 141 mmol/L (ref 135–145)
Sodium: 142 mmol/L (ref 135–145)
Sodium: 141 mmol/L (ref 135–145)
Sodium: 140 mmol/L (ref 135–145)
TCO2: 23 mmol/L (ref 22–32)
TCO2: 23 mmol/L (ref 22–32)
TCO2: 25 mmol/L (ref 22–32)
TCO2: 25 mmol/L (ref 22–32)
TCO2: 22 mmol/L (ref 22–32)
TCO2: 23 mmol/L (ref 22–32)
TCO2: 21 mmol/L — ABNORMAL LOW (ref 22–32)

## 2017-12-22 LAB — POCT I-STAT 4, (NA,K, GLUC, HGB,HCT)
Glucose, Bld: 97 mg/dL (ref 70–99)
HCT: 27 % — ABNORMAL LOW (ref 36.0–46.0)
HEMOGLOBIN: 9.2 g/dL — AB (ref 12.0–15.0)
Potassium: 3.5 mmol/L (ref 3.5–5.1)
SODIUM: 142 mmol/L (ref 135–145)

## 2017-12-22 LAB — GLUCOSE, CAPILLARY
GLUCOSE-CAPILLARY: 144 mg/dL — AB (ref 70–99)
Glucose-Capillary: 118 mg/dL — ABNORMAL HIGH (ref 70–99)
Glucose-Capillary: 83 mg/dL (ref 70–99)
Glucose-Capillary: 139 mg/dL — ABNORMAL HIGH (ref 70–99)

## 2017-12-22 LAB — POCT PREGNANCY, URINE: PREG TEST UR: NEGATIVE

## 2017-12-22 LAB — PROTIME-INR
INR: 1.4
PROTHROMBIN TIME: 17 s — AB (ref 11.4–15.2)

## 2017-12-22 LAB — PHOSPHORUS: PHOSPHORUS: 2.5 mg/dL (ref 2.5–4.6)

## 2017-12-22 LAB — PLATELET COUNT: PLATELETS: 198 10*3/uL (ref 150–400)

## 2017-12-22 LAB — HEMOGLOBIN AND HEMATOCRIT, BLOOD
HCT: 27.7 % — ABNORMAL LOW (ref 36.0–46.0)
HEMOGLOBIN: 8.7 g/dL — AB (ref 12.0–15.0)

## 2017-12-22 LAB — MAGNESIUM: Magnesium: 3.1 mg/dL — ABNORMAL HIGH (ref 1.7–2.4)

## 2017-12-22 LAB — APTT: APTT: 34 s (ref 24–36)

## 2017-12-22 SURGERY — REPAIR, MITRAL VALVE, MINIMALLY INVASIVE
Anesthesia: General | Site: Chest | Laterality: Right

## 2017-12-22 MED ORDER — ACETAMINOPHEN 500 MG PO TABS
1000.0000 mg | ORAL_TABLET | Freq: Four times a day (QID) | ORAL | Status: DC
  Administered 2017-12-23 – 2017-12-24 (×6): 1000 mg via ORAL
  Filled 2017-12-22 (×7): qty 2

## 2017-12-22 MED ORDER — ATROPINE SULFATE 0.4 MG/ML IJ SOLN
INTRAMUSCULAR | Status: DC | PRN
  Administered 2017-12-22 (×2): .2 mg via INTRAVENOUS

## 2017-12-22 MED ORDER — SODIUM CHLORIDE 0.45 % IV SOLN: INTRAVENOUS | Status: DC | PRN

## 2017-12-22 MED ORDER — ASPIRIN EC 325 MG PO TBEC
325.0000 mg | DELAYED_RELEASE_TABLET | Freq: Every day | ORAL | Status: DC
  Administered 2017-12-23: 325 mg via ORAL
  Filled 2017-12-22: qty 1

## 2017-12-22 MED ORDER — PROTAMINE SULFATE 10 MG/ML IV SOLN
INTRAVENOUS | Status: AC
  Filled 2017-12-22: qty 10

## 2017-12-22 MED ORDER — TRAMADOL HCL 50 MG PO TABS
50.0000 mg | ORAL_TABLET | ORAL | Status: DC | PRN
  Administered 2017-12-24 – 2017-12-28 (×10): 100 mg via ORAL
  Filled 2017-12-22 (×10): qty 2

## 2017-12-22 MED ORDER — MIDAZOLAM HCL 2 MG/2ML IJ SOLN
INTRAMUSCULAR | Status: AC
  Filled 2017-12-22: qty 2

## 2017-12-22 MED ORDER — PANTOPRAZOLE SODIUM 40 MG PO TBEC
40.0000 mg | DELAYED_RELEASE_TABLET | Freq: Every day | ORAL | Status: DC
  Administered 2017-12-24: 40 mg via ORAL
  Filled 2017-12-22: qty 1

## 2017-12-22 MED ORDER — PHENYLEPHRINE 40 MCG/ML (10ML) SYRINGE FOR IV PUSH (FOR BLOOD PRESSURE SUPPORT)
PREFILLED_SYRINGE | INTRAVENOUS | Status: AC
  Filled 2017-12-22: qty 10

## 2017-12-22 MED ORDER — DOCUSATE SODIUM 100 MG PO CAPS
200.0000 mg | ORAL_CAPSULE | Freq: Every day | ORAL | Status: DC
  Administered 2017-12-23 – 2017-12-24 (×2): 200 mg via ORAL
  Filled 2017-12-22 (×2): qty 2

## 2017-12-22 MED ORDER — SODIUM CHLORIDE 0.9 % IR SOLN
Status: DC | PRN
  Administered 2017-12-22: 3000 mL

## 2017-12-22 MED ORDER — SODIUM CHLORIDE 0.9 % IV SOLN: INTRAVENOUS | Status: AC

## 2017-12-22 MED ORDER — LACTATED RINGERS IV SOLN
INTRAVENOUS | Status: DC | PRN
  Administered 2017-12-22: 07:00:00 via INTRAVENOUS

## 2017-12-22 MED ORDER — FENTANYL CITRATE (PF) 250 MCG/5ML IJ SOLN
INTRAMUSCULAR | Status: DC | PRN
  Administered 2017-12-22: 25 ug via INTRAVENOUS
  Administered 2017-12-22: 100 ug via INTRAVENOUS
  Administered 2017-12-22: 150 ug via INTRAVENOUS
  Administered 2017-12-22: 100 ug via INTRAVENOUS
  Administered 2017-12-22: 150 ug via INTRAVENOUS
  Administered 2017-12-22: 575 ug via INTRAVENOUS
  Administered 2017-12-22: 50 ug via INTRAVENOUS
  Administered 2017-12-22: 100 ug via INTRAVENOUS

## 2017-12-22 MED ORDER — METOPROLOL TARTRATE 5 MG/5ML IV SOLN: 2.5000 mg | INTRAVENOUS | Status: DC | PRN

## 2017-12-22 MED ORDER — INSULIN REGULAR BOLUS VIA INFUSION
0.0000 [IU] | Freq: Three times a day (TID) | INTRAVENOUS | Status: DC
  Filled 2017-12-22: qty 10

## 2017-12-22 MED ORDER — CHLORHEXIDINE GLUCONATE 4 % EX LIQD: 30.0000 mL | CUTANEOUS | Status: DC

## 2017-12-22 MED ORDER — PROTAMINE SULFATE 10 MG/ML IV SOLN
INTRAVENOUS | Status: DC | PRN
  Administered 2017-12-22: 360 mg via INTRAVENOUS

## 2017-12-22 MED ORDER — POTASSIUM CHLORIDE 10 MEQ/50ML IV SOLN
10.0000 meq | INTRAVENOUS | Status: AC
  Administered 2017-12-22 (×3): 10 meq via INTRAVENOUS

## 2017-12-22 MED ORDER — METOPROLOL TARTRATE 12.5 MG HALF TABLET
12.5000 mg | ORAL_TABLET | Freq: Two times a day (BID) | ORAL | Status: DC
  Administered 2017-12-23 – 2017-12-26 (×5): 12.5 mg via ORAL
  Filled 2017-12-22 (×6): qty 1

## 2017-12-22 MED ORDER — METOPROLOL TARTRATE 25 MG/10 ML ORAL SUSPENSION: 12.5000 mg | Freq: Two times a day (BID) | ORAL | Status: DC

## 2017-12-22 MED ORDER — ALBUMIN HUMAN 5 % IV SOLN: 250.0000 mL | INTRAVENOUS | Status: DC | PRN

## 2017-12-22 MED ORDER — SODIUM CHLORIDE 0.9 % IV SOLN: 250.0000 mL | INTRAVENOUS | Status: DC

## 2017-12-22 MED ORDER — ASPIRIN 81 MG PO CHEW: 324.0000 mg | CHEWABLE_TABLET | Freq: Every day | ORAL | Status: DC

## 2017-12-22 MED ORDER — MAGNESIUM SULFATE 4 GM/100ML IV SOLN
4.0000 g | Freq: Once | INTRAVENOUS | Status: AC
  Administered 2017-12-22: 4 g via INTRAVENOUS
  Filled 2017-12-22: qty 100

## 2017-12-22 MED ORDER — SODIUM CHLORIDE 0.9 % IV SOLN
INTRAVENOUS | Status: DC | PRN
  Administered 2017-12-22: 750 mg via INTRAVENOUS

## 2017-12-22 MED ORDER — LACTATED RINGERS IV SOLN
INTRAVENOUS | Status: DC
  Administered 2017-12-22: 14:00:00 via INTRAVENOUS

## 2017-12-22 MED ORDER — DEXMEDETOMIDINE HCL IN NACL 200 MCG/50ML IV SOLN
INTRAVENOUS | Status: AC
  Filled 2017-12-22: qty 50

## 2017-12-22 MED ORDER — PROTAMINE SULFATE 10 MG/ML IV SOLN
INTRAVENOUS | Status: AC
  Filled 2017-12-22: qty 5

## 2017-12-22 MED ORDER — SERTRALINE HCL 100 MG PO TABS
200.0000 mg | ORAL_TABLET | Freq: Every day | ORAL | Status: DC
  Administered 2017-12-23 – 2017-12-28 (×6): 200 mg via ORAL
  Filled 2017-12-22 (×6): qty 2

## 2017-12-22 MED ORDER — CHLORHEXIDINE GLUCONATE 0.12 % MT SOLN
15.0000 mL | OROMUCOSAL | Status: AC
  Administered 2017-12-22: 15 mL via OROMUCOSAL

## 2017-12-22 MED ORDER — SODIUM CHLORIDE 0.9 % IV SOLN: INTRAVENOUS | Status: DC

## 2017-12-22 MED ORDER — HEPARIN SODIUM (PORCINE) 1000 UNIT/ML IJ SOLN
INTRAMUSCULAR | Status: DC | PRN
  Administered 2017-12-22: 36000 [IU] via INTRAVENOUS

## 2017-12-22 MED ORDER — 0.9 % SODIUM CHLORIDE (POUR BTL) OPTIME
TOPICAL | Status: DC | PRN
  Administered 2017-12-22: 1000 mL

## 2017-12-22 MED ORDER — SODIUM CHLORIDE 0.9 % IV SOLN
1.5000 g | Freq: Two times a day (BID) | INTRAVENOUS | Status: AC
  Administered 2017-12-22 – 2017-12-24 (×4): 1.5 g via INTRAVENOUS
  Filled 2017-12-22 (×4): qty 1.5

## 2017-12-22 MED ORDER — EPHEDRINE SULFATE 50 MG/ML IJ SOLN
INTRAMUSCULAR | Status: AC
  Filled 2017-12-22: qty 1

## 2017-12-22 MED ORDER — PROPOFOL 10 MG/ML IV BOLUS
INTRAVENOUS | Status: DC | PRN
  Administered 2017-12-22: 100 mg via INTRAVENOUS

## 2017-12-22 MED ORDER — VANCOMYCIN HCL IN DEXTROSE 1-5 GM/200ML-% IV SOLN
1000.0000 mg | Freq: Once | INTRAVENOUS | Status: AC
  Administered 2017-12-22: 1000 mg via INTRAVENOUS
  Filled 2017-12-22: qty 200

## 2017-12-22 MED ORDER — ALBUMIN HUMAN 5 % IV SOLN
INTRAVENOUS | Status: DC | PRN
  Administered 2017-12-22: 13:00:00 via INTRAVENOUS

## 2017-12-22 MED ORDER — METOPROLOL TARTRATE 12.5 MG HALF TABLET
12.5000 mg | ORAL_TABLET | Freq: Once | ORAL | Status: AC
  Administered 2017-12-22: 12.5 mg via ORAL
  Filled 2017-12-22: qty 1

## 2017-12-22 MED ORDER — BISACODYL 10 MG RE SUPP: 10.0000 mg | Freq: Every day | RECTAL | Status: DC

## 2017-12-22 MED ORDER — ACETAMINOPHEN 650 MG RE SUPP
650.0000 mg | Freq: Once | RECTAL | Status: AC
  Administered 2017-12-22: 650 mg via RECTAL

## 2017-12-22 MED ORDER — TRAZODONE HCL 100 MG PO TABS
200.0000 mg | ORAL_TABLET | Freq: Every day | ORAL | Status: DC
  Administered 2017-12-24 – 2017-12-27 (×5): 200 mg via ORAL
  Filled 2017-12-22 (×5): qty 2

## 2017-12-22 MED ORDER — SODIUM CHLORIDE 0.9 % IV SOLN
0.0000 ug/min | INTRAVENOUS | Status: DC
  Filled 2017-12-22: qty 2

## 2017-12-22 MED ORDER — SODIUM CHLORIDE 0.9% FLUSH: 3.0000 mL | INTRAVENOUS | Status: DC | PRN

## 2017-12-22 MED ORDER — LACTATED RINGERS IV SOLN: INTRAVENOUS | Status: DC | PRN

## 2017-12-22 MED ORDER — ORAL CARE MOUTH RINSE: 15.0000 mL | OROMUCOSAL | Status: DC

## 2017-12-22 MED ORDER — SODIUM CHLORIDE 0.9% FLUSH: 10.0000 mL | INTRAVENOUS | Status: DC | PRN

## 2017-12-22 MED ORDER — BISACODYL 5 MG PO TBEC
10.0000 mg | DELAYED_RELEASE_TABLET | Freq: Every day | ORAL | Status: DC
  Administered 2017-12-23 – 2017-12-24 (×2): 10 mg via ORAL
  Filled 2017-12-22 (×2): qty 2

## 2017-12-22 MED ORDER — ACETAMINOPHEN 160 MG/5ML PO SOLN: 650.0000 mg | Freq: Once | ORAL | Status: AC

## 2017-12-22 MED ORDER — SODIUM CHLORIDE 0.9% FLUSH
3.0000 mL | Freq: Two times a day (BID) | INTRAVENOUS | Status: DC
  Administered 2017-12-24: 3 mL via INTRAVENOUS

## 2017-12-22 MED ORDER — NITROGLYCERIN IN D5W 200-5 MCG/ML-% IV SOLN: 0.0000 ug/min | INTRAVENOUS | Status: DC

## 2017-12-22 MED ORDER — CHLORHEXIDINE GLUCONATE CLOTH 2 % EX PADS
6.0000 | MEDICATED_PAD | Freq: Every day | CUTANEOUS | Status: DC
  Administered 2017-12-22 – 2017-12-24 (×3): 6 via TOPICAL

## 2017-12-22 MED ORDER — HEPARIN SODIUM (PORCINE) 1000 UNIT/ML IJ SOLN
INTRAMUSCULAR | Status: AC
  Filled 2017-12-22: qty 1

## 2017-12-22 MED ORDER — ROCURONIUM BROMIDE 50 MG/5ML IV SOLN
INTRAVENOUS | Status: AC
  Filled 2017-12-22: qty 2

## 2017-12-22 MED ORDER — CHLORHEXIDINE GLUCONATE 0.12% ORAL RINSE (MEDLINE KIT)
15.0000 mL | Freq: Two times a day (BID) | OROMUCOSAL | Status: DC
  Administered 2017-12-22: 15 mL via OROMUCOSAL

## 2017-12-22 MED ORDER — FENTANYL CITRATE (PF) 250 MCG/5ML IJ SOLN
INTRAMUSCULAR | Status: AC
  Filled 2017-12-22: qty 25

## 2017-12-22 MED ORDER — ROCURONIUM BROMIDE 50 MG/5ML IV SOLN
INTRAVENOUS | Status: AC
  Filled 2017-12-22: qty 4

## 2017-12-22 MED ORDER — MIDAZOLAM HCL 10 MG/2ML IJ SOLN
INTRAMUSCULAR | Status: AC
  Filled 2017-12-22: qty 2

## 2017-12-22 MED ORDER — SODIUM CHLORIDE 0.9 % IJ SOLN
INTRAMUSCULAR | Status: AC
  Filled 2017-12-22: qty 20

## 2017-12-22 MED ORDER — LACTATED RINGERS IV SOLN
INTRAVENOUS | Status: DC
  Administered 2017-12-22: 14:00:00 via INTRAVENOUS
  Administered 2017-12-23: 10 mL/h via INTRAVENOUS

## 2017-12-22 MED ORDER — SODIUM CHLORIDE 0.9 % IV SOLN
INTRAVENOUS | Status: DC
  Filled 2017-12-22: qty 1

## 2017-12-22 MED ORDER — OXYCODONE HCL 5 MG PO TABS
5.0000 mg | ORAL_TABLET | ORAL | Status: DC | PRN
  Administered 2017-12-22: 5 mg via ORAL
  Administered 2017-12-22: 10 mg via ORAL
  Administered 2017-12-23: 5 mg via ORAL
  Administered 2017-12-23: 10 mg via ORAL
  Administered 2017-12-23: 5 mg via ORAL
  Administered 2017-12-23 (×2): 10 mg via ORAL
  Administered 2017-12-23: 5 mg via ORAL
  Administered 2017-12-24 (×6): 10 mg via ORAL
  Filled 2017-12-22 (×2): qty 2
  Filled 2017-12-22: qty 1
  Filled 2017-12-22 (×5): qty 2
  Filled 2017-12-22 (×2): qty 1
  Filled 2017-12-22 (×2): qty 2
  Filled 2017-12-22: qty 1
  Filled 2017-12-22 (×2): qty 2

## 2017-12-22 MED ORDER — SODIUM CHLORIDE 0.9 % IV SOLN
INTRAVENOUS | Status: DC | PRN
  Administered 2017-12-22: 13:00:00 via INTRAVENOUS

## 2017-12-22 MED ORDER — INSULIN ASPART 100 UNIT/ML ~~LOC~~ SOLN
0.0000 [IU] | SUBCUTANEOUS | Status: DC
  Administered 2017-12-22 – 2017-12-23 (×4): 2 [IU] via SUBCUTANEOUS

## 2017-12-22 MED ORDER — PROTAMINE SULFATE 10 MG/ML IV SOLN
INTRAVENOUS | Status: AC
  Filled 2017-12-22: qty 25

## 2017-12-22 MED ORDER — MORPHINE SULFATE (PF) 2 MG/ML IV SOLN
1.0000 mg | INTRAVENOUS | Status: DC | PRN
  Administered 2017-12-22: 2 mg via INTRAVENOUS
  Filled 2017-12-22 (×2): qty 1

## 2017-12-22 MED ORDER — MORPHINE SULFATE (PF) 2 MG/ML IV SOLN
1.0000 mg | INTRAVENOUS | Status: DC | PRN
  Administered 2017-12-22 – 2017-12-23 (×2): 2 mg via INTRAVENOUS
  Filled 2017-12-22: qty 1

## 2017-12-22 MED ORDER — FAMOTIDINE IN NACL 20-0.9 MG/50ML-% IV SOLN
20.0000 mg | Freq: Two times a day (BID) | INTRAVENOUS | Status: DC
  Administered 2017-12-23: 20 mg via INTRAVENOUS
  Filled 2017-12-22: qty 50

## 2017-12-22 MED ORDER — MIDAZOLAM HCL 5 MG/5ML IJ SOLN
INTRAMUSCULAR | Status: DC | PRN
  Administered 2017-12-22 (×5): 2 mg via INTRAVENOUS
  Administered 2017-12-22: 4 mg via INTRAVENOUS

## 2017-12-22 MED ORDER — SODIUM CHLORIDE 0.9% FLUSH
10.0000 mL | Freq: Two times a day (BID) | INTRAVENOUS | Status: DC
  Administered 2017-12-24: 20 mL
  Administered 2017-12-24: 10 mL

## 2017-12-22 MED ORDER — ROCURONIUM BROMIDE 10 MG/ML (PF) SYRINGE
PREFILLED_SYRINGE | INTRAVENOUS | Status: DC | PRN
  Administered 2017-12-22 (×4): 50 mg via INTRAVENOUS
  Administered 2017-12-22: 100 mg via INTRAVENOUS

## 2017-12-22 MED ORDER — DEXMEDETOMIDINE HCL IN NACL 400 MCG/100ML IV SOLN
0.0000 ug/kg/h | INTRAVENOUS | Status: DC
  Filled 2017-12-22: qty 100

## 2017-12-22 MED ORDER — CHLORHEXIDINE GLUCONATE 0.12 % MT SOLN
15.0000 mL | Freq: Once | OROMUCOSAL | Status: AC
  Administered 2017-12-22: 15 mL via OROMUCOSAL
  Filled 2017-12-22: qty 15

## 2017-12-22 MED ORDER — MIDAZOLAM HCL 2 MG/2ML IJ SOLN: 2.0000 mg | INTRAMUSCULAR | Status: DC | PRN

## 2017-12-22 MED ORDER — ACETAMINOPHEN 160 MG/5ML PO SOLN: 1000.0000 mg | Freq: Four times a day (QID) | ORAL | Status: DC

## 2017-12-22 MED ORDER — ONDANSETRON HCL 4 MG/2ML IJ SOLN
4.0000 mg | Freq: Four times a day (QID) | INTRAMUSCULAR | Status: DC | PRN
  Administered 2017-12-22 – 2017-12-23 (×2): 4 mg via INTRAVENOUS
  Filled 2017-12-22 (×2): qty 2

## 2017-12-22 MED ORDER — PROPOFOL 10 MG/ML IV BOLUS
INTRAVENOUS | Status: AC
  Filled 2017-12-22: qty 20

## 2017-12-22 MED ORDER — VASOPRESSIN 20 UNIT/ML IV SOLN
INTRAVENOUS | Status: AC
  Filled 2017-12-22: qty 1

## 2017-12-22 MED ORDER — EPINEPHRINE PF 1 MG/10ML IJ SOSY
PREFILLED_SYRINGE | INTRAMUSCULAR | Status: AC
  Filled 2017-12-22: qty 10

## 2017-12-22 MED ORDER — LACTATED RINGERS IV SOLN: 500.0000 mL | Freq: Once | INTRAVENOUS | Status: DC | PRN

## 2017-12-22 SURGICAL SUPPLY — 102 items
ADAPTER CARDIO PERF ANTE/RETRO (ADAPTER) ×3 IMPLANT
ADH SKN CLS APL DERMABOND .7 (GAUZE/BANDAGES/DRESSINGS) ×4
ADPR PRFSN 84XANTGRD RTRGD (ADAPTER) ×2
APL SWBSTK 6 STRL LF DISP (MISCELLANEOUS) ×2
APPLICATOR COTTON TIP 6 STRL (MISCELLANEOUS) IMPLANT
APPLICATOR COTTON TIP 6IN STRL (MISCELLANEOUS) ×3
BAG DECANTER FOR FLEXI CONT (MISCELLANEOUS) ×6 IMPLANT
BLADE SURG 11 STRL SS (BLADE) ×3 IMPLANT
CANISTER SUCT 3000ML PPV (MISCELLANEOUS) ×6 IMPLANT
CANNULA FEM VENOUS REMOTE 22FR (CANNULA) ×1 IMPLANT
CANNULA FEMORAL ART 14 SM (MISCELLANEOUS) ×3 IMPLANT
CANNULA GUNDRY RCSP 15FR (MISCELLANEOUS) ×3 IMPLANT
CANNULA OPTISITE PERFUSION 16F (CANNULA) IMPLANT
CANNULA OPTISITE PERFUSION 18F (CANNULA) ×1 IMPLANT
CANNULA SUMP PERICARDIAL (CANNULA) ×6 IMPLANT
CATH KIT ON Q 5IN SLV (PAIN MANAGEMENT) IMPLANT
CELLS DAT CNTRL 66122 CELL SVR (MISCELLANEOUS) ×2 IMPLANT
CONN ST 1/4X3/8  BEN (MISCELLANEOUS) ×2
CONN ST 1/4X3/8 BEN (MISCELLANEOUS) ×4 IMPLANT
CONNECTOR 1/2X3/8X1/2 3 WAY (MISCELLANEOUS) ×1
CONNECTOR 1/2X3/8X1/2 3WAY (MISCELLANEOUS) ×2 IMPLANT
CONT SPEC 4OZ CLIKSEAL STRL BL (MISCELLANEOUS) ×3 IMPLANT
COVER BACK TABLE 24X17X13 BIG (DRAPES) ×3 IMPLANT
CRADLE DONUT ADULT HEAD (MISCELLANEOUS) ×3 IMPLANT
DERMABOND ADVANCED (GAUZE/BANDAGES/DRESSINGS) ×2
DERMABOND ADVANCED .7 DNX12 (GAUZE/BANDAGES/DRESSINGS) ×4 IMPLANT
DEVICE PMI PUNCTURE CLOSURE (MISCELLANEOUS) ×3 IMPLANT
DEVICE SUT CK QUICK LOAD INDV (Prosthesis & Implant Heart) ×1 IMPLANT
DEVICE SUT CK QUICK LOAD MINI (Prosthesis & Implant Heart) ×1 IMPLANT
DEVICE TROCAR PUNCTURE CLOSURE (ENDOMECHANICALS) ×3 IMPLANT
DRAIN CHANNEL 28F RND 3/8 FF (WOUND CARE) ×6 IMPLANT
DRAPE BILATERAL SPLIT (DRAPES) ×3 IMPLANT
DRAPE C-ARM 42X72 X-RAY (DRAPES) ×3 IMPLANT
DRAPE CV SPLIT W-CLR ANES SCRN (DRAPES) ×3 IMPLANT
DRAPE INCISE IOBAN 66X45 STRL (DRAPES) ×9 IMPLANT
DRAPE SLUSH/WARMER DISC (DRAPES) ×3 IMPLANT
DRSG AQUACEL AG ADV 3.5X14 (GAUZE/BANDAGES/DRESSINGS) ×1 IMPLANT
DRSG COVADERM 4X8 (GAUZE/BANDAGES/DRESSINGS) ×2 IMPLANT
ELECT BLADE 6.5 EXT (BLADE) ×3 IMPLANT
ELECT REM PT RETURN 9FT ADLT (ELECTROSURGICAL) ×6
ELECTRODE REM PT RTRN 9FT ADLT (ELECTROSURGICAL) ×4 IMPLANT
FELT TEFLON 1X6 (MISCELLANEOUS) ×5 IMPLANT
FEMORAL VENOUS CANN RAP (CANNULA) IMPLANT
GAUZE SPONGE 4X4 12PLY STRL LF (GAUZE/BANDAGES/DRESSINGS) ×3 IMPLANT
GLOVE BIOGEL M 6.5 STRL (GLOVE) ×2 IMPLANT
GLOVE BIOGEL PI IND STRL 6 (GLOVE) IMPLANT
GLOVE BIOGEL PI IND STRL 6.5 (GLOVE) IMPLANT
GLOVE BIOGEL PI IND STRL 7.0 (GLOVE) IMPLANT
GLOVE BIOGEL PI INDICATOR 6 (GLOVE) ×3
GLOVE BIOGEL PI INDICATOR 6.5 (GLOVE) ×1
GLOVE BIOGEL PI INDICATOR 7.0 (GLOVE) ×3
GLOVE ORTHO TXT STRL SZ7.5 (GLOVE) ×9 IMPLANT
GLOVE SURG SS PI 6.0 STRL IVOR (GLOVE) ×1 IMPLANT
GOWN STRL REUS W/ TWL LRG LVL3 (GOWN DISPOSABLE) ×8 IMPLANT
GOWN STRL REUS W/TWL LRG LVL3 (GOWN DISPOSABLE) ×27
KIT BASIN OR (CUSTOM PROCEDURE TRAY) ×3 IMPLANT
KIT DILATOR VASC 18G NDL (KITS) ×3 IMPLANT
KIT DRAINAGE VACCUM ASSIST (KITS) ×1 IMPLANT
KIT SUCTION CATH 14FR (SUCTIONS) ×3 IMPLANT
KIT SUT CK MINI COMBO 4X17 (Prosthesis & Implant Heart) ×1 IMPLANT
KIT TURNOVER KIT B (KITS) ×3 IMPLANT
LEAD PACING MYOCARDI (MISCELLANEOUS) ×3 IMPLANT
LINE VENT (MISCELLANEOUS) ×1 IMPLANT
NDL AORTIC ROOT 14G 7F (CATHETERS) ×2 IMPLANT
NEEDLE AORTIC ROOT 14G 7F (CATHETERS) ×3 IMPLANT
NS IRRIG 1000ML POUR BTL (IV SOLUTION) ×15 IMPLANT
PACK OPEN HEART (CUSTOM PROCEDURE TRAY) ×3 IMPLANT
PAD ARMBOARD 7.5X6 YLW CONV (MISCELLANEOUS) ×6 IMPLANT
PAD ELECT DEFIB RADIOL ZOLL (MISCELLANEOUS) ×3 IMPLANT
RETRACTOR WND ALEXIS 18 MED (MISCELLANEOUS) ×2 IMPLANT
RTRCTR WOUND ALEXIS 18CM MED (MISCELLANEOUS) ×3
SET CANNULATION TOURNIQUET (MISCELLANEOUS) ×3 IMPLANT
SET CARDIOPLEGIA MPS 5001102 (MISCELLANEOUS) ×1 IMPLANT
SET IRRIG TUBING LAPAROSCOPIC (IRRIGATION / IRRIGATOR) ×3 IMPLANT
SOLUTION ANTI FOG 6CC (MISCELLANEOUS) ×3 IMPLANT
SUT BONE WAX W31G (SUTURE) ×3 IMPLANT
SUT E-PACK MINIMALLY INVASIVE (SUTURE) ×3 IMPLANT
SUT ETHIBON 2 0 V 52N 30 (SUTURE) ×1 IMPLANT
SUT ETHIBOND 2 0 SH (SUTURE) ×2 IMPLANT
SUT ETHIBOND X763 2 0 SH 1 (SUTURE) ×3 IMPLANT
SUT GORETEX CV 4 TH 22 36 (SUTURE) ×3 IMPLANT
SUT GORETEX CV4 TH-18 (SUTURE) ×6 IMPLANT
SUT PROLENE 3 0 SH1 36 (SUTURE) ×12 IMPLANT
SUT SILK 2 0 SH CR/8 (SUTURE) IMPLANT
SUT SILK 3 0 SH CR/8 (SUTURE) IMPLANT
SUT VIC AB 2-0 CTX 36 (SUTURE) IMPLANT
SUT VIC AB 3-0 SH 8-18 (SUTURE) ×1 IMPLANT
SUT VICRYL 2 TP 1 (SUTURE) IMPLANT
SYR 10ML LL (SYRINGE) ×2 IMPLANT
SYSTEM SAHARA CHEST DRAIN ATS (WOUND CARE) ×3 IMPLANT
TAPE CLOTH SURG 4X10 WHT LF (GAUZE/BANDAGES/DRESSINGS) ×1 IMPLANT
TOWEL GREEN STERILE (TOWEL DISPOSABLE) ×3 IMPLANT
TOWEL GREEN STERILE FF (TOWEL DISPOSABLE) ×3 IMPLANT
TRAY FOLEY SLVR 16FR TEMP STAT (SET/KITS/TRAYS/PACK) ×3 IMPLANT
TROCAR XCEL BLADELESS 5X75MML (TROCAR) ×3 IMPLANT
TROCAR XCEL NON-BLD 11X100MML (ENDOMECHANICALS) ×6 IMPLANT
TUBE SUCT INTRACARD DLP 20F (MISCELLANEOUS) ×3 IMPLANT
TUNNELER SHEATH ON-Q 11GX8 DSP (PAIN MANAGEMENT) IMPLANT
UNDERPAD 30X30 (UNDERPADS AND DIAPERS) ×3 IMPLANT
VALVE MITRAL 31MM (Prosthesis & Implant Heart) ×1 IMPLANT
WATER STERILE IRR 1000ML POUR (IV SOLUTION) ×6 IMPLANT
WIRE .035 3MM-J 145CM (WIRE) ×3 IMPLANT

## 2017-12-22 NOTE — Anesthesia Postprocedure Evaluation (Signed)
Anesthesia Post Note  Patient: Gloria James  Procedure(s) Performed: MINIMALLY INVASIVE MITRAL VALVE REPLACEMENT(MVR) (Right Chest) TRANSESOPHAGEAL ECHOCARDIOGRAM (TEE) (N/A )     Patient location during evaluation: SICU Anesthesia Type: General Level of consciousness: sedated Pain management: pain level controlled Vital Signs Assessment: post-procedure vital signs reviewed and stable Respiratory status: patient remains intubated per anesthesia plan Cardiovascular status: stable Postop Assessment: no apparent nausea or vomiting Anesthetic complications: no    Last Vitals:  Vitals:   12/22/17 0546 12/22/17 1349  BP: 110/63 125/71  Pulse: 62 83  Resp: 18 15  Temp: 36.9 C   SpO2: 99% 92%    Last Pain:  Vitals:   12/22/17 0558  TempSrc:   PainSc: 0-No pain                 Janathan Bribiesca,W. [place]

## 2017-12-22 NOTE — Progress Notes (Signed)
      [address]       [place] [address]             [phone]      S/p MVR  Extubated  BP 92/67   Pulse 67   Temp 98.2 F (36.8 C)   Resp 10   Ht 5\' 8"  (1.727 m)   Wt 225 lb (102.1 kg)   SpO2 92%   BMI 34.21 kg/m   PA 28/15 CI= 2.6  Intake/Output Summary (Last 24 hours) at 12/22/2017 1812 Last data filed at 12/22/2017 1800 Gross per 24 hour  Intake 2997.04 ml  Output 1623 ml  Net 1374.04 ml  K= 3.5- supplemented Hct= 27  Doing well s/p MVR  Viviann Spare C. Dorris Fetch, MD [facility] [phone]

## 2017-12-22 NOTE — Interval H&P Note (Signed)
History and Physical Interval Note:  12/22/2017 5:44 AM  Gloria James  has presented today for surgery, with the diagnosis of MR  The various methods of treatment have been discussed with the patient and family. After consideration of risks, benefits and other options for treatment, the patient has consented to  Procedure(s): MINIMALLY INVASIVE MITRAL VALVE REPAIR OR REPLACEMENT(MVR) (Right) TRANSESOPHAGEAL ECHOCARDIOGRAM (TEE) (N/A) as a surgical intervention .  The patient's history has been reviewed, patient examined, no change in status, stable for surgery.  I have reviewed the patient's chart and labs.  Questions were answered to the patient's satisfaction.     Purcell Nails

## 2017-12-22 NOTE — Anesthesia Procedure Notes (Signed)
Central Venous Catheter Insertion Performed by: Lillia Abed, MD, anesthesiologist Start/End6/27/2019 6:50 AM, 12/22/2017 7:05 AM Patient location: Pre-op. Preanesthetic checklist: patient identified, IV checked, risks and benefits discussed, surgical consent, monitors and equipment checked, pre-op evaluation, timeout performed and anesthesia consent Position: Trendelenburg Lidocaine 1% used for infiltration and patient sedated Hand hygiene performed  and maximum sterile barriers used  Catheter size: 8.5 Fr Central line and PA cath was placed.MAC introducer Swan type:thermodilution Procedure performed using ultrasound guided technique. Ultrasound Notes:anatomy identified, needle tip was noted to be adjacent to the nerve/plexus identified, no ultrasound evidence of intravascular and/or intraneural injection and image(s) printed for medical record Attempts: 1 Following insertion, line sutured, dressing applied and Biopatch. Post procedure assessment: blood return through all ports, free fluid flow and no air  Patient tolerated the procedure well with no immediate complications.

## 2017-12-22 NOTE — Progress Notes (Signed)
  Echocardiogram Interoperative TEE has been performed.  Belva Chimes 12/22/2017, 8:27 AM

## 2017-12-22 NOTE — Op Note (Signed)
CARDIOTHORACIC SURGERY OPERATIVE NOTE  Date of Procedure:  12/22/2017  Preoperative Diagnosis:   Rheumatic Valvular Heart Disease  Severe Mitral Regurgitation  Mild Mitral Stenosis  Postoperative Diagnosis: Same  Procedure:    Minimally-Invasive Mitral Valve Replacement  Sorin [employer] Optiform bileaflet mechanical valve (size 31mm, catalog # [id number], serial # [id number])    Surgeon: Salvatore Decent. Cornelius Moras, MD  Assistant: Jari Favre, PA-C  Anesthesia: Rosezella Florida, MD  Operative Findings:  Rheumatic valvular heart disease  Type IIIA mitral valve dysfunction with severe mitral regurgitation  Mild-moderate mitral stenosis  Normal left ventricular systolic function           BRIEF CLINICAL NOTE AND INDICATIONS FOR SURGERY  Patient is a 42 year old obese white female who has been referred for recently discovered likely rheumatic heart disease with severe symptomatic primary mitral regurgitation and mitral stenosis.  The patient has been reasonably healthy for most of her life. She has a long history of tobacco abuse as well as chronic anxiety and panic disorder. She describes a long history of mild exertional shortness of breath which she attributed to her tobacco use and obesity. She developed rapidly progressivesymptoms of exertional shortness of breath over the last few months. She developed orthopnea, lower extremity edema, and intermittent resting shortness of breath. She began to experience episodes of PND and subsequently presented to [facility] in [place] likely acute exacerbation of chronic diastolic congestive heart failure and pulmonary edema. CT angiogram of the chest was negative for pulmonary embolus. An echocardiogram performed at that time revealed normal left ventricular systolic function with rheumatic appearing mitral valve and severe mitral stenosis. Symptoms improved with diuretic therapy. Patient was referred for  outpatientcardiology consultation and evaluated by Dr.Branchin early April.She subsequently underwent transesophageal echocardiogram and diagnostic cardiac catheterization on Nov 03, 2017. TEE confirmed the presence of rheumatic appearing mitral valve with moderate mitral stenosis and severe mitral regurgitation. Diagnostic cardiac catheterization revealed normal coronary arteries with mild pulmonary hypertension and large V waves on wedge tracing consistent with severe mitral regurgitation. The patient was referred for elective surgical consultation.  The patient has been seen in consultation and counseled at length regarding the indications, risks and potential benefits of surgery.  All questions have been answered, and the patient provides full informed consent for the operation as described.    DETAILS OF THE OPERATIVE PROCEDURE  Preparation:  The patient is brought to the operating room on the above mentioned date and central monitoring was established by the anesthesia team including placement of Swan-Ganz catheter through the left internal jugular vein.  A radial arterial line is placed. The patient is placed in the supine position on the operating table.  Intravenous antibiotics are administered. General endotracheal anesthesia is induced uneventfully. The patient is initially intubated using a dual lumen endotracheal tube.  A Foley catheter is placed.  Baseline transesophageal echocardiogram was performed.  Findings were notable for classical rheumatic mitral valve disease with severe thickening and foreshortening of the subvalvular apparatus to the entire mitral valve, severe restriction of leaflet mobility causing severe mitral regurgitation.  There was mild to moderate mitral stenosis with mean transvalvular gradient estimated 11 mmHg.  The aortic valve appeared normal.  Left ventricular function appeared normal.  Right ventricular function appeared normal.  The tricuspid annulus  measured less than 4.0 cm in diameter.  There was mild tricuspid regurgitation.  A soft roll is placed behind the patient's left scapula and the neck gently extended and turned to the left.  The patient's right neck, chest, abdomen, both groins, and both lower extremities are prepared and draped in a sterile manner. A time out procedure is performed.  Surgical Approach:  A right miniature anterolateral thoracotomy incision is performed. The incision is placed just lateral to and superior to the right nipple. The pectoralis major muscle is retracted medially and completely preserved. The right pleural space is entered through the 3rd intercostal space. A soft tissue retractor is placed.  Two 11 mm ports are placed through separate stab incisions inferiorly. The right pleural space is insufflated continuously with carbon dioxide gas through the posterior port during the remainder of the operation.  A pledgeted sutures placed through the dome of the right hemidiaphragm and retracted inferiorly to facilitate exposure.  A longitudinal incision is made in the pericardium 3 cm anterior to the phrenic nerve and silk traction sutures are placed on either side of the incision for exposure.   Extracorporeal Cardiopulmonary Bypass and Myocardial Protection:  A small incision is made in the right inguinal crease and the anterior surface of the right common femoral artery and right common femoral vein are identified.  The patient is placed in Trendelenburg position. The right internal jugular vein is cannulated with Seldinger technique and a guidewire advanced into the right atrium. The patient is heparinized systemically. The right internal jugular vein is cannulated with a 14 [place] pediatric femoral venous cannula. Pursestring sutures are placed on the anterior surface of the right common femoral vein and right common femoral artery. The right common femoral vein is cannulated with the Seldinger technique and a  guidewire is advanced under transesophageal echocardiogram guidance through the right atrium. The femoral vein is cannulated with a long 22 French femoral venous cannula. The right common femoral artery is cannulated with Seldinger technique and a flexible guidewire is advanced until it can be appreciated intraluminally in the descending thoracic aorta on transesophageal echocardiogram. The femoral artery is cannulated with an 18 French femoral arterial cannula.  Adequate heparinization is verified.     The entire pre-bypass portion of the operation was notable for stable hemodynamics.  Cardiopulmonary bypass was begun.  Vacuum assist venous drainage is utilized. The incision in the pericardium is extended in both directions. Venous drainage and exposure are notably excellent.  An antegrade cardioplegia cannula is placed in the ascending aorta.    The patient is cooled to 28C systemic temperature.  The aortic cross clamp is applied and cardioplegia is delivered initially in an antegrade fashion through the aortic root using modified [place] cold blood cardioplegia ([place] blood cardioplegia protocol).   The initial cardioplegic arrest is rapid with early diastolic arrest.    Myocardial protection was felt to be excellent.   Mitral Valve Replacement:  A left atriotomy incision was performed through the interatrial groove and extended partially across the back wall of the left atrium after opening the oblique sinus inferiorly.  The mitral valve is exposed using a self-retaining retractor.  The mitral valve was inspected and notable for classical rheumatic features with severe fibrosis, thickening, and some areas of calcification throughout the entire mitral valve.  Both leaflets were thickened.  Posterior leaflet was extremely thickened and essentially completely immobile.  There was significant calcification in the posterior annulus and subvalvular apparatus.  The subvalvular apparatus was all very  thickened and foreshortened.  There was cortical fusion with severe fusion of all of the subvalvular apparatus from the anterior leaflet..  The entire anterior leaflet and its subvalvular apparatus was  excised sharply.  It was not possible to preserve any of the subvalvular apparatus from the anterior leaflet.  The posterior leaflet was split in the midline and debulked.  The annulus was sized to accept a 31 mm mechanical prosthetic valve.  Mitral valve replacement is performed using interrupted 2-0 Ethibond horizontal mattress pledgeted sutures with pledgets in the supra annular position.  A [employer] Optiform bileaflet mechanical valve (size 16mm, catalog [id number], serial [id number]) was secured in place uneventfully. All valve sutures were secured using a Cor-knot device.    The valve was tested to make certain that both leaflets open and closed without impingement.  Rewarming is begun.   Procedure Completion:  The atriotomy was closed using a 2-layer closure of running 3-0 Prolene suture after placing a sump drain across the mitral valve to serve as a left ventricular vent.  One final dose of warm "reanimation dose" cardioplegia was administered through the aortic root.  The aortic cross clamp was removed after a total cross clamp time of 80 minutes.  Epicardial pacing wires are fixed to the inferior wall of the right ventricule and to the right atrial appendage. The patient is rewarmed to 37C temperature. The left ventricular vent is removed. The antegrade cardioplegia cannula is removed. The patient is weaned and disconnected from cardiopulmonary bypass.  The patient's rhythm at separation from bypass was sinus.  The patient was weaned from bypass without any inotropic support. Total cardiopulmonary bypass time for the operation was 120 minutes.  Followup transesophageal echocardiogram performed after separation from bypass revealed a well-seated bileaflet mechanical valve in the mitral  position that was functioning normally. There was no paravalvular leak.  Left ventricular function was unchanged from preoperatively.  The mean gradient across the mitral valve was estimated to be 4 mmHg.  The femoral arterial and venous cannulae were removed uneventfully. There was a palpable pulse in the distal right common femoral artery after removal of the cannula. Protamine was administered to reverse the anticoagulation. The right internal jugular cannula was removed and manual pressure held on the neck for 15 minutes.  Single lung ventilation was begun. The atriotomy closure was inspected for hemostasis. The pericardial sac was drained using a 28 French Bard drain placed through the anterior port incision.  The right pleural space is irrigated with saline solution and inspected for hemostasis. The right pleural space was drained using a 28 French Bard drain placed through the posterior port incision. The miniature thoracotomy incision was closed in multiple layers in routine fashion. The right groin incision was inspected for hemostasis and closed in multiple layers in routine fashion.  The post-bypass portion of the operation was notable for stable rhythm and hemodynamics.  No blood products were administered during the operation.   Disposition:  The patient tolerated the procedure well.  The patient was reintubated using a single lumen endotracheal tube and subsequently transported to the surgical intensive care unit in stable condition. There were no intraoperative complications. All sponge instrument and needle counts are verified correct at completion of the operation.     Salvatore Decent. Cornelius Moras MD 12/22/2017 1:11 PM

## 2017-12-22 NOTE — Transfer of Care (Signed)
Immediate Anesthesia Transfer of Care Note  Patient: Gloria James  Procedure(s) Performed: MINIMALLY INVASIVE MITRAL VALVE REPLACEMENT(MVR) (Right Chest) TRANSESOPHAGEAL ECHOCARDIOGRAM (TEE) (N/A )  Patient Location: ICU  Anesthesia Type:General  Level of Consciousness: Patient remains intubated per anesthesia plan  Airway & Oxygen Therapy: Patient remains intubated per anesthesia plan and Patient placed on Ventilator (see vital sign flow sheet for setting)  Post-op Assessment: Report given to RN and Post -op Vital signs reviewed and stable  Post vital signs: Reviewed and stable  Last Vitals:  Vitals Value Taken Time  BP 125/71 12/22/2017  1:49 PM  Temp 35.9 C 12/22/2017  1:58 PM  Pulse 75 12/22/2017  1:58 PM  Resp 10 12/22/2017  1:58 PM  SpO2 99 % 12/22/2017  1:58 PM  Vitals shown include unvalidated device data.  Last Pain:  Vitals:   12/22/17 0558  TempSrc:   PainSc: 0-No pain      Patients Stated Pain Goal: 3 (12/22/17 0558)  Complications: No apparent anesthesia complications

## 2017-12-22 NOTE — H&P (Signed)
[address]       Gloria James [address]             [phone]          CARDIOTHORACIC SURGERY HISTORY AND PHYSICAL EXAM  Referring Provider is Gloria Linden, MD  Primary Cardiologist is James, Gloria Pea, MD PCP is James, Gloria Critchley, MD      Chief Complaint  Patient presents with  . 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  The patient has been reasonably healthy for most of her life.  She has a long history of tobacco abuse as well as chronic anxiety and panic disorder.  She describes a long history of mild exertional shortness of breath which she attributed to her tobacco use and obesity.  She developed rapidly progressive symptoms of exertional shortness of breath over the last few months.  She developed orthopnea, lower extremity edema, and intermittent resting shortness of breath.  She began to experience episodes of PND and subsequently presented to [name] in [place] with likely acute exacerbation of chronic diastolic congestive heart failure and pulmonary edema.  CT angiogram of the chest was negative for pulmonary embolus.  An echocardiogram performed at that time revealed normal left ventricular systolic function with rheumatic appearing mitral valve and severe mitral stenosis.  Symptoms improved with diuretic therapy.  Patient was referred for outpatient cardiology consultation and evaluated by Dr. [name] in early April.  She subsequently underwent transesophageal echocardiogram and diagnostic cardiac catheterization on Nov 03, 2017.  TEE confirmed the presence of rheumatic appearing mitral valve with moderate mitral stenosis and severe mitral regurgitation.  Diagnostic cardiac catheterization revealed normal coronary  arteries with mild pulmonary hypertension and large V waves on wedge tracing consistent with severe mitral regurgitation.  The patient was referred for elective surgical consultation.  Patient is married  And lives with her husband and 3 children in [place], [place].  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  She reports no significant physical limitations although she admits that she does not exercise on a regular basis.  She has experienced decreased energy, loss of appetite, and worsening exertional shortness of breath over several months with acute exacerbation in March as noted previously.  Since hospital discharge he has been doing better on oral Lasix therapy although she reports that she still gets short of breath with moderate level activity.  She has not had chest pain or chest tightness.  She has had some dizzy spells without syncope.  She has occasional palpitations without any known history of irregular heart rhythms.  Patient is a 42 year old obese female with rheumatic mitral valve disease who returns the office today with tentative plans to proceed with minimally invasive mitral valve replacement later this week.  She was originally seen in consultation on Nov 17, 2017.  Since then she underwent dental consultation and teeth extraction by Dr. Robin James.  She reports no new problems or complaints and she is eager to proceed with surgery as previously planned.  Past Medical History:  Diagnosis Date  . Acute pulmonary edema (HCC)   . Anemia   . CHF (congestive heart failure) (HCC)   . Chronic diastolic (congestive) heart failure (  HCC)   . Depression   . Dyspnea   . Dysrhythmia    Patient states she would have episodes for tachycardia in the past  . History of kidney stones   . Panic disorder without agoraphobia   . PONV (postoperative nausea and vomiting)   . Precordial pain   . Rheumatic mitral stenosis with regurgitation     . Tricuspid regurgitation     Past Surgical History:  Procedure Laterality Date  . APPENDECTOMY     age 56  . CARDIAC CATHETERIZATION  11/03/2017  . MULTIPLE EXTRACTIONS WITH ALVEOLOPLASTY N/A 12/06/2017   Procedure: Extraction of tooth #'s 4-6, 8-12, 20,22-29 and 32 with alveoloplasty;  Surgeon: Gloria James, DDS;  Location: [facility] OR;  Service: Oral Surgery;  Laterality: N/A;  . MULTIPLE TOOTH EXTRACTIONS  2015  . OTHER SURGICAL HISTORY Left 2016   open surgery for kidney stones  . RIGHT AND LEFT HEART CATH N/A 11/03/2017   Procedure: RIGHT AND LEFT HEART CATH;  Surgeon: Kathleene Hazel, MD;  Location: [facility] INVASIVE CV LAB;  Service: Cardiovascular;  Laterality: N/A;  . TEE WITHOUT CARDIOVERSION N/A 11/03/2017   Procedure: TRANSESOPHAGEAL ECHOCARDIOGRAM (TEE);  Surgeon: Gloria Bunting, MD;  Location: [facility] ENDOSCOPY;  Service: Cardiovascular;  Laterality: N/A;  . TONSILLECTOMY     6    Family History  Problem Relation Age of Onset  . Cancer Father   . Dementia Maternal Grandmother   . Heart disease Maternal Grandfather        had open heart problems  . Cancer Paternal Grandmother   . Cancer Paternal Grandfather     Social History Social History   Tobacco Use  . Smoking status: Former Smoker    Packs/day: 0.50    Years: 26.00    Pack years: 13.00    Types: Cigarettes    Start date: 12/18/1991    Last attempt to quit: 09/23/2017    Years since quitting: 0.2  . Smokeless tobacco: Never Used  Substance Use Topics  . Alcohol use: Not Currently  . Drug use: Never    Prior to Admission medications   Medication Sig Start Date End Date Taking? Authorizing Provider  albuterol (PROAIR HFA) 108 (90 Base) MCG/ACT inhaler Inhale 2 puffs into the lungs every 4 (four) hours as needed for wheezing or shortness of breath.     [provider]  ALPRAZolam Prudy Feeler) 0.5 MG tablet Take 0.5 mg by mouth 4 (four) times daily as needed for anxiety.     [provider]   fluticasone (FLONASE) 50 MCG/ACT nasal spray Place 2 sprays into both nostrils daily as needed for allergies.     [provider]  furosemide (LASIX) 40 MG tablet Take 1.5 tablets (60 mg total) by mouth daily. 10/24/17 01/22/18  Antoine Poche, MD  oxyCODONE-acetaminophen (PERCOCET) 5-325 MG tablet Take one or two tablets by mouth every 6 hours as needed for pain. Patient not taking: Reported on 12/19/2017 12/06/17   Gloria James, DDS  sertraline (ZOLOFT) 100 MG tablet Take 200 mg by mouth daily.     [provider]  traZODone (DESYREL) 100 MG tablet Take 200 mg by mouth at bedtime.     [provider]    No Known Allergies    Review of Systems:              General:  decreased appetite, decreased energy, + weight gain, no weight loss, no fever             Cardiac:                       no chest pain with exertion, no chest pain at rest, + SOB with exertion, + resting SOB, + PND, + orthopnea, + palpitations, no arrhythmia, no atrial fibrillation, + LE edema, + dizzy spells, no syncope             Respiratory:                 + shortness of breath, no home oxygen, no productive cough, + dry cough, no bronchitis, no wheezing, no hemoptysis, no asthma, no pain with inspiration or cough, no sleep apnea, no CPAP at night             GI:                               no difficulty swallowing, + reflux, + frequent heartburn, no hiatal hernia, no abdominal pain, no constipation, + diarrhea, no hematochezia, no hematemesis, no melena             GU:                              no dysuria,  + frequency, no urinary tract infection, no hematuria, no kidney stones, no kidney disease             Vascular:                     no pain suggestive of claudication, no pain in feet, + leg cramps, no varicose veins, no DVT, no non-healing foot ulcer             Neuro:                         no stroke, no TIA's, no seizures, no headaches, no temporary  blindness one eye,  no slurred speech, no peripheral neuropathy, no chronic pain, no instability of gait, no memory/cognitive dysfunction             Musculoskeletal:         no arthritis, no joint swelling, no myalgias, no difficulty walking, normal mobility              Skin:                            no rash, no itching, no skin infections, no pressure sores or ulcerations             Psych:                         + anxiety, no depression, no nervousness, no unusual recent stress             Eyes:                           no blurry vision, no floaters, no recent vision changes, no wears glasses or contacts             ENT:  no hearing loss, no loose or painful teeth, no dentures, last saw dentist > 1 year ago              Hematologic:               no easy bruising, no abnormal bleeding, no clotting disorder, no frequent epistaxis             Endocrine:                   no diabetes, does not check CBG's at home                                                       Physical Exam:              BP 102/73 (BP Location: Right Arm, Patient Position: Sitting, Cuff Size: Normal)   Pulse 76   Resp 18   Ht  (1.727 m)   Wt 229 lb (103.9 kg)   SpO2 99% Comment: RA  BMI 34.82 kg/m              General:                      Obese,  well-appearing             HEENT:                       Unremarkable              Neck:                           no JVD, no bruits, no adenopathy              Chest:                          clear to auscultation, symmetrical breath sounds, no wheezes, no rhonchi              CV:                              RRR, grade III/VI crescendo/decrescendo murmur heard best at LSB,  no diastolic murmur             Abdomen:                    soft, non-tender, no masses              Extremities:                 warm, well-perfused, pulses diminished but palpable, no LE edema             Rectal/GU                   Deferred              Neuro:                         Grossly non-focal and symmetrical throughout             Skin:  Clean and dry, no rashes, no breakdown   Diagnostic Tests:  Transesophageal Echocardiography  Patient: Gloria James, Gloria James MR #: [id number] Study Date: 11/03/2017 Gender: F Age: 60 Height: 172.7 cm Weight: 106.6 kg BSA: 2.3 m^2 Pt. Status: Room:  ADMITTING Olga Millers PERFORMING Olga Millers ATTENDING Patrick Jupiter, M.D. Lisette Abu, M.D. REFERRING Patrick Jupiter, M.D. SONOGRAPHER Sheralyn Boatman  cc:  ------------------------------------------------------------------- LV EF: 55% - 60%  ------------------------------------------------------------------- Indications: 424.0 Mitral valve disease. Severe valvular disease  ------------------------------------------------------------------- History: PMH: Mitral regurgitation. Pulmonary edema. Mitral stenosis.  ------------------------------------------------------------------- Study Conclusions  - Left ventricle: Systolic function was normal. The estimated ejection fraction was in the range of 55% to 60%. Wall motion was normal; there were no regional wall motion abnormalities. - Aortic valve: There was trivial regurgitation. - Mitral valve: Mild thickening, consistent with rheumatic disease. Mobility of the posterior leaflet was severely restricted. The findings are consistent with moderate stenosis. There was severe regurgitation. Valve area by pressure half-time: 1.76 cm^2. - Left atrium: The atrium was severely dilated. No evidence of thrombus in the atrial cavity or appendage. - Atrial septum: No defect or patent foramen ovale was identified. - Tricuspid valve: No evidence of vegetation. There was mild-moderate regurgitation. - Pulmonic valve: No evidence of vegetation.  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  ------------------------------------------------------------------- Study data: Study status: Routine. Consent: The risks, benefits, and alternatives to the procedure were explained to the patient and informed consent was obtained. Procedure: The patient reported no pain pre or post test. Initial setup. The patient was brought to the laboratory. Surface ECG leads were monitored. Sedation. Conscious sedation was administered by cardiology staff. Transesophageal echocardiography. Topical anesthesia was obtained using viscous lidocaine. An adult multiplane transesophageal probe was inserted by the attending cardiologistwithout difficulty. 3D image quality was excellent. Study completion: The patient tolerated the procedure well. There were no complications. Administered medications: Fentanyl, , IV. Midazolam, 8mg , IV. Diphenhydramine, 25mg , IV. Diagnostic transesophageal echocardiography. 2D and color Doppler. Birthdate: Patient birthdate: 23-May-1976. Age: Patient is 42 yr old. Sex: Gender: female. BMI: 35.7 kg/m^2. Blood pressure: 113/63 Patient status: Outpatient. Study date: Study date: 11/03/2017. Study time: 07:57 AM. Location: Endoscopy.  -------------------------------------------------------------------  ------------------------------------------------------------------- Left ventricle: Systolic function was normal. The estimated ejection fraction was in the range of 55% to 60%. Wall motion was normal; there were no regional wall motion  abnormalities.  ------------------------------------------------------------------- Aortic valve: Trileaflet; mildly thickened leaflets. Cusp separation was normal. Doppler: There was trivial regurgitation.  ------------------------------------------------------------------- Aorta: Aortic root: The aortic root had mild diffuse disease.  ------------------------------------------------------------------- Mitral valve: Mild thickening, consistent with rheumatic disease. Mobility of the posterior leaflet was severely restricted. Doppler: The findings are consistent with moderate stenosis. There was severe regurgitation. Valve area by pressure half-time: 1.76 cm^2. Indexed valve area by pressure half-time: 0.76 cm^2/m^2. Mean gradient (D): 15 mm Hg.  ------------------------------------------------------------------- Left atrium: The atrium was severely dilated. No evidence of thrombus in the atrial cavity or appendage.  ------------------------------------------------------------------- Atrial septum: No defect or patent foramen ovale was identified.  ------------------------------------------------------------------- Right ventricle: Systolic function was normal.  ------------------------------------------------------------------- Pulmonic valve: Structurally normal valve. Cusp separation was normal. No evidence of vegetation.  ------------------------------------------------------------------- Tricuspid valve: Structurally normal valve. Leaflet separation was normal. No evidence  of vegetation. Doppler: There was mild-moderate regurgitation.  ------------------------------------------------------------------- Right atrium: The atrium was normal in size.  ------------------------------------------------------------------- Pericardium: There was no pericardial  effusion.  ------------------------------------------------------------------- Measurements  Mitral valve Value Mitral mean velocity, D 188 cm/s Mitral pressure half-time 125 ms Mitral mean gradient, D 15 mm Hg Mitral valve area, PHT, DP 1.76 cm^2 Mitral valve area/bsa, PHT, DP 0.76 cm^2/m^2 Mitral annulus VTI, D 97 cm Mitral regurg VTI, PISA 172 cm Mitral ERO, PISA 0.84 cm^2 Mitral regurg volume, PISA 144 ml  Legend: (L) and (H) mark values outside specified reference range.  ------------------------------------------------------------------- Prepared and Electronically Authenticated by  Olga Millers 2019-05-09T14:28:35    RIGHT AND LEFT HEART CATH  Conclusion   1. NO angiographic evidence of CAD 2. Large v-wave on wedge tracing c/w severe MR  Recommendations: Continue workup for surgical MV replacement/repair. I do not think she will be a candidate for balloon valvuloplasty given her severe MR.   Indications   Mitral valve stenosis, unspecified etiology [I05.0 (ICD-10-CM)]  Procedural Details/Technique   Technical Details Indication: 42 yo female with h/o tobacco abuse, severe MS/MR.   Procedure: The risks, benefits, complications, treatment options, and expected outcomes were discussed with the patient. The patient and/or family concurred with the proposed plan, giving informed consent. The patient was brought to the cath lab after IV hydration was given. The patient was further sedated with Versed and Fentanyl. There was an IV catheter present in the right antecubital vein. I prepped this area and changed the catheter out for a 5 French sheath. Right heart cath performed with a balloon tipped catheter. The right wrist was prepped and draped in a sterile fashion. 1% lidocaine was used for local anesthesia.  Using the modified Seldinger access technique, a 5 French sheath was placed in the right radial artery. 3 mg Verapamil was given through the sheath. 5000 units IV heparin was given. Standard diagnostic catheters were used to perform selective coronary angiography. I did not cross the aortic valve due to a density noted on the leaflet during TEE. The sheath was removed from the right radial artery and a Terumo hemostasis band was applied at the arteriotomy site on the right wrist.     Estimated blood loss <50 mL.  During this procedure the patient was administered the following to achieve and maintain moderate conscious sedation: Versed 2 mg, Fentanyl 50 mcg, while the patient's heart rate, blood pressure, and oxygen saturation were continuously monitored. The period of conscious sedation was 27 minutes, of which I was present face-to-face 100% of this time.  Complications   Complications documented before study signed (11/03/2017 2:52 PM EDT)    RIGHT AND LEFT HEART CATH   None Documented by Kathleene Hazel, MD 11/03/2017 2:38 PM EDT  Time Range: Intraprocedure      Coronary Findings   Diagnostic  Dominance: Right  Left Anterior Descending  Vessel is large.  First Diagonal James  Vessel is large in size.  Left Circumflex  Second Obtuse Marginal James  Vessel is moderate in size.  Right Coronary Artery  Vessel is large.  Intervention   No interventions have been documented.  Right Heart   Right Heart Pressures Hemodynamic findings consistent with mild pulmonary hypertension.  Coronary Diagrams   Diagnostic Diagram       Implants    No implant documentation for this case.  MERGE Images   Show images for CARDIAC CATHETERIZATION   Link to Procedure Log   Procedure Log  Hemo Data    Most Recent Value  Fick Cardiac Output 6.77 L/min  Fick Cardiac Output Index 3.09 (L/min)/BSA  RA A Wave 17 mmHg  RA V Wave 13 mmHg  RA Mean 12 mmHg  RV  Systolic Pressure 44 mmHg  RV Diastolic Pressure 6 mmHg  RV EDP 12 mmHg  PA Systolic Pressure 47 mmHg  PA Diastolic Pressure 22 mmHg  PA Mean 33 mmHg  PW A Wave 28 mmHg  PW V Wave 48 mmHg  PW Mean 30 mmHg  AO Systolic Pressure 92 mmHg  AO Diastolic Pressure 62 mmHg  AO Mean 77 mmHg  QP/QS 1  TPVR Index 10.68 HRUI  TSVR Index 24.91 HRUI  PVR SVR Ratio 0.05  TPVR/TSVR Ratio 0.43     Impression:  Patient has rheumatic mitral valve disease with stage D severe symptomatic mitral regurgitation and moderate mitral stenosis.  Mitral valve has classical features of rheumatic disease with leaflet thickening, restricted leaflet mobility, and some foreshortening of the subvalvular apparatus. The posterior leaflet is very severely restricted and nearly fixed. There is a broad jet of regurgitation with flow reversal in the pulmonary veins. There is moderate left atrial enlargement. Left ventricular size and function appear normal. The aortic valve appears normal. Right ventricular size and function is normal. The tricuspid annulus does not appear to be dilated. There is mild tricuspid regurgitation. Diagnostic cardiac catheterization revealed normal coronary artery anatomy with no significant coronary artery disease. There was mild pulmonary hypertension and large V waves consistent with severe mitral regurgitation. I agree the patient needs mitral valve repair or replacement. Based upon review of the patient's transesophageal echocardiogram I feel that the likelihood of durable valve repair is probably relatively low.    Plan:  I have again reviewed the indications, risk, and potential benefits of mitral valve replacement with the patient in the office today.  Expectations for her postoperative convalescence have been discussed.  We again discussed the possibility of replacing the mitral valve using a mechanical prosthesis with the attendant need for long-term anticoagulation versus  the alternative of replacing it using a bioprosthetic tissue valve with its potential for late structural valve deterioration and failure, depending upon the patient's longevity. The patient specifically requests that if the mitral valve must be replaced that it be done using a mechanicalvalve.   We discussed the presence of some tricuspid regurgitation and she understands that we will assess this at the time of surgery and consider concomitant tricuspid valve repair if necessary.  Alternative surgical approaches have been discussed including a comparison between conventional sternotomy and minimally-invasive techniques.   The patient understands and accepts all potential risks of surgery including but not limited to risk of death, stroke or other neurologic complication, myocardial infarction, congestive heart failure, respiratory failure, renal failure, bleeding requiring transfusion and/or reexploration, arrhythmia, infection or other wound complications, pneumonia, pleural and/or pericardial effusion, pulmonary embolus, aortic dissection or other major vascular complication, or delayed complications related to valve repair or replacement including but not limited to structural valve deterioration and failure, thrombosis, embolization, endocarditis, or paravalvular leak.  Specific risks potentially related to the minimally-invasive approach were discussed at length, including but not limited to risk of conversion to full or partial sternotomy, aortic dissection or other major vascular complication, unilateral acute lung injury or pulmonary edema, phrenic nerve dysfunction or paralysis, rib fracture, chronic pain, lung hernia, or lymphocele. All of her questions have been answered.   I spent in excess of 15 minutes during  the conduct of this office consultation and >50% of this time involved direct face-to-face encounter with the patient for counseling and/or coordination of their care.   Salvatore Decent.  Cornelius Moras, MD 12/19/2017 3:02 PM

## 2017-12-22 NOTE — Anesthesia Procedure Notes (Signed)
Procedure Name: Intubation Date/Time: 12/22/2017 8:09 AM Performed by: Cleda Daub, CRNA Pre-anesthesia Checklist: Patient identified, Emergency Drugs available, Suction available and Patient being monitored Patient Re-evaluated:Patient Re-evaluated prior to induction Oxygen Delivery Method: Circle system utilized Preoxygenation: Pre-oxygenation with 100% oxygen Induction Type: IV induction Ventilation: Mask ventilation without difficulty and Mask ventilation throughout procedure Laryngoscope Size: Mac and 3 Grade View: Grade I Tube type: Oral Endobronchial tube: Left and Double lumen EBT and 39 Fr Number of attempts: 1 Airway Equipment and Method: Stylet and Fiberoptic brochoscope Placement Confirmation: ETT inserted through vocal cords under direct vision,  positive ETCO2 and breath sounds checked- equal and bilateral Secured at: 29.5 cm Tube secured with: Tape Dental Injury: Teeth and Oropharynx as per pre-operative assessment

## 2017-12-22 NOTE — Brief Op Note (Signed)
12/22/2017  1:08 PM  PATIENT:  Gloria James  42 y.o. female  PRE-OPERATIVE DIAGNOSIS:  MR  POST-OPERATIVE DIAGNOSIS:  MR  PROCEDURE:  Procedure(s): MINIMALLY INVASIVE MITRAL VALVE REPLACEMENT(MVR) (Right) TRANSESOPHAGEAL ECHOCARDIOGRAM (TEE) (N/A)  SURGEON:  Surgeon(s) and Role:    Purcell Nails, MD - Primary  PHYSICIAN ASSISTANT:  Jari Favre, PA-C   ANESTHESIA:   general  EBL:  400 mL   BLOOD ADMINISTERED:none  DRAINS: ROUTINE   LOCAL MEDICATIONS USED:  NONE  SPECIMEN:  Source of Specimen:  MITRAL VALVE LEAFLETS  DISPOSITION OF SPECIMEN:  PATHOLOGY  COUNTS:  YES  TOURNIQUET:  * No tourniquets in log *  DICTATION: .Dragon Dictation  PLAN OF CARE: Admit to inpatient   PATIENT DISPOSITION:  ICU - intubated and hemodynamically stable.   Delay start of Pharmacological VTE agent (>24hrs) due to surgical blood loss or risk of bleeding: yes

## 2017-12-22 NOTE — Anesthesia Procedure Notes (Signed)
Arterial Line Insertion Start/End6/27/2019 6:42 AM, 12/22/2017 6:50 AM Performed by: Ponciano Ort, CRNA, CRNA  Patient location: Pre-op. Preanesthetic checklist: patient identified, IV checked, site marked, risks and benefits discussed, surgical consent, monitors and equipment checked, pre-op evaluation and anesthesia consent Lidocaine 1% used for infiltration and patient sedated Right, radial was placed Catheter size: 20 G Hand hygiene performed , maximum sterile barriers used  and Seldinger technique used Allen's test indicative of satisfactory collateral circulation Attempts: 1 Procedure performed using ultrasound guided technique. Ultrasound Notes:anatomy identified, needle tip was noted to be adjacent to the nerve/plexus identified and no ultrasound evidence of intravascular and/or intraneural injection Following insertion, dressing applied and Biopatch. Post procedure assessment: normal  Patient tolerated the procedure well with no immediate complications.

## 2017-12-22 NOTE — Procedures (Signed)
Extubation Procedure Note  Patient Details:   Name: Gloria James DOB: Nov 17, 1975 MRN: [id number]   Airway Documentation:    Vent end date: 12/22/17 Vent end time: 1725   Evaluation  O2 sats: stable throughout Complications: No apparent complications Patient did tolerate procedure well. Bilateral Breath Sounds: Diminished   Yes   Patient extubated to 5L [place] without complications.   [name] 12/22/2017, 5:31 PM

## 2017-12-23 ENCOUNTER — Inpatient Hospital Stay (HOSPITAL_COMMUNITY): Payer: Medicaid Other

## 2017-12-23 ENCOUNTER — Encounter (HOSPITAL_COMMUNITY): Payer: Self-pay | Admitting: Thoracic Surgery (Cardiothoracic Vascular Surgery)

## 2017-12-23 LAB — GLUCOSE, CAPILLARY
GLUCOSE-CAPILLARY: 131 mg/dL — AB (ref 70–99)
GLUCOSE-CAPILLARY: 103 mg/dL — AB (ref 70–99)
GLUCOSE-CAPILLARY: 131 mg/dL — AB (ref 70–99)
GLUCOSE-CAPILLARY: 142 mg/dL — AB (ref 70–99)
GLUCOSE-CAPILLARY: 138 mg/dL — AB (ref 70–99)
Glucose-Capillary: 139 mg/dL — ABNORMAL HIGH (ref 70–99)

## 2017-12-23 LAB — MAGNESIUM
MAGNESIUM: 2.6 mg/dL — AB (ref 1.7–2.4)
Magnesium: 2.5 mg/dL — ABNORMAL HIGH (ref 1.7–2.4)

## 2017-12-23 LAB — POCT I-STAT, CHEM 8
BUN: 8 mg/dL (ref 6–20)
CALCIUM ION: 1.1 mmol/L — AB (ref 1.15–1.40)
CHLORIDE: 98 mmol/L (ref 98–111)
Creatinine, Ser: 0.8 mg/dL (ref 0.44–1.00)
Glucose, Bld: 143 mg/dL — ABNORMAL HIGH (ref 70–99)
HCT: 30 % — ABNORMAL LOW (ref 36.0–46.0)
Hemoglobin: 10.2 g/dL — ABNORMAL LOW (ref 12.0–15.0)
POTASSIUM: 3.8 mmol/L (ref 3.5–5.1)
Sodium: 136 mmol/L (ref 135–145)
TCO2: 21 mmol/L — ABNORMAL LOW (ref 22–32)

## 2017-12-23 LAB — BASIC METABOLIC PANEL
ANION GAP: 6 (ref 5–15)
BUN: 7 mg/dL (ref 6–20)
CO2: 21 mmol/L — ABNORMAL LOW (ref 22–32)
Calcium: 7.1 mg/dL — ABNORMAL LOW (ref 8.9–10.3)
Chloride: 107 mmol/L (ref 98–111)
Creatinine, Ser: 0.69 mg/dL (ref 0.44–1.00)
Glucose, Bld: 118 mg/dL — ABNORMAL HIGH (ref 70–99)
POTASSIUM: 3.9 mmol/L (ref 3.5–5.1)
SODIUM: 134 mmol/L — AB (ref 135–145)

## 2017-12-23 LAB — CBC
HCT: 31.9 % — ABNORMAL LOW (ref 36.0–46.0)
HEMOGLOBIN: 10 g/dL — AB (ref 12.0–15.0)
MCH: 26.3 pg (ref 26.0–34.0)
MCHC: 31.3 g/dL (ref 30.0–36.0)
MCV: 83.9 fL (ref 78.0–100.0)
PLATELETS: 129 10*3/uL — AB (ref 150–400)
RBC: 3.8 MIL/uL — AB (ref 3.87–5.11)
RDW: 13.3 % (ref 11.5–15.5)
WBC: 9.2 10*3/uL (ref 4.0–10.5)

## 2017-12-23 LAB — CREATININE, SERUM
CREATININE: 0.92 mg/dL (ref 0.44–1.00)
GFR calc Af Amer: >60 mL/min (ref >60)
GFR calc non Af Amer: >60 mL/min (ref >60)

## 2017-12-23 MED ORDER — FUROSEMIDE 10 MG/ML IJ SOLN
20.0000 mg | Freq: Four times a day (QID) | INTRAMUSCULAR | Status: AC
  Administered 2017-12-23 (×3): 20 mg via INTRAVENOUS
  Filled 2017-12-23 (×3): qty 2

## 2017-12-23 MED ORDER — KETOROLAC TROMETHAMINE 15 MG/ML IJ SOLN
15.0000 mg | Freq: Four times a day (QID) | INTRAMUSCULAR | Status: AC
  Administered 2017-12-23 – 2017-12-24 (×5): 15 mg via INTRAVENOUS
  Filled 2017-12-23 (×5): qty 1

## 2017-12-23 MED ORDER — ALPRAZOLAM 0.25 MG PO TABS
0.2500 mg | ORAL_TABLET | Freq: Two times a day (BID) | ORAL | Status: DC | PRN
  Administered 2017-12-23 – 2017-12-28 (×4): 0.25 mg via ORAL
  Filled 2017-12-23 (×4): qty 1

## 2017-12-23 MED ORDER — POTASSIUM CHLORIDE 10 MEQ/50ML IV SOLN
10.0000 meq | INTRAVENOUS | Status: AC
  Administered 2017-12-23 (×3): 10 meq via INTRAVENOUS
  Filled 2017-12-23 (×3): qty 50

## 2017-12-23 MED ORDER — ENOXAPARIN SODIUM 40 MG/0.4ML ~~LOC~~ SOLN
40.0000 mg | Freq: Every day | SUBCUTANEOUS | Status: DC
  Administered 2017-12-24: 40 mg via SUBCUTANEOUS
  Filled 2017-12-23: qty 0.4

## 2017-12-23 MED ORDER — WARFARIN SODIUM 5 MG PO TABS
5.0000 mg | ORAL_TABLET | Freq: Every day | ORAL | Status: DC
Start: 2017-12-23 — End: 2017-12-26
  Administered 2017-12-23 – 2017-12-25 (×3): 5 mg via ORAL
  Filled 2017-12-23 (×3): qty 1

## 2017-12-23 MED ORDER — WARFARIN - PHYSICIAN DOSING INPATIENT
Freq: Every day | Status: DC
  Administered 2017-12-23 – 2017-12-25 (×2)

## 2017-12-23 MED FILL — Sodium Bicarbonate IV Soln 8.4%: INTRAVENOUS | Qty: 50 | Status: AC

## 2017-12-23 MED FILL — Heparin Sodium (Porcine) Inj 1000 Unit/ML: INTRAMUSCULAR | Qty: 2500 | Status: AC

## 2017-12-23 MED FILL — Mannitol IV Soln 20%: INTRAVENOUS | Qty: 500 | Status: AC

## 2017-12-23 MED FILL — Magnesium Sulfate Inj 50%: INTRAMUSCULAR | Qty: 10 | Status: AC

## 2017-12-23 MED FILL — Heparin Sodium (Porcine) Inj 1000 Unit/ML: INTRAMUSCULAR | Qty: 30 | Status: AC

## 2017-12-23 MED FILL — Sodium Chloride IV Soln 0.9%: INTRAVENOUS | Qty: 2000 | Status: AC

## 2017-12-23 MED FILL — Electrolyte-R (PH 7.4) Solution: INTRAVENOUS | Qty: 3000 | Status: AC

## 2017-12-23 MED FILL — Potassium Chloride Inj 2 mEq/ML: INTRAVENOUS | Qty: 40 | Status: AC

## 2017-12-23 NOTE — Progress Notes (Signed)
Patient ID: Gloria James, female   DOB: 01-18-1976, 42 y.o.   MRN: [id number] EVENING ROUNDS NOTE :     [address]       [name] [address]             [phone]                 1 Day Post-Op Procedure(s) (LRB): MINIMALLY INVASIVE MITRAL VALVE REPLACEMENT(MVR) (Right) TRANSESOPHAGEAL ECHOCARDIOGRAM (TEE) (N/A)  Total Length of Stay:  LOS: 1 day  BP 99/64   Pulse 84   Temp 99.1 F (37.3 C) (Oral)   Resp 10   Ht 5\' 8"  (1.727 m)   Wt 236 lb 8 oz (107.3 kg)   SpO2 97%   BMI 35.96 kg/m   .Intake/Output      06/28 0701 - 06/29 0700   P.O. 1260   I.V. (mL/kg) 191.6 (1.8)   Blood    IV Piggyback 444.7   Total Intake(mL/kg) 1896.3 (17.7)   Urine (mL/kg/hr) 1825 (1.1)   Blood    Chest Tube 250   Total Output 2075   Net -178.7         . sodium chloride    . cefUROXime (ZINACEF)  IV Stopped (12/23/17 1908)  . lactated ringers Stopped (12/23/17 0412)  . lactated ringers 10 mL/hr at 12/23/17 2200     Lab Results  Component Value Date   WBC 9.2 12/23/2017   HGB 10.2 (L) 12/23/2017   HCT 30.0 (L) 12/23/2017   PLT 129 (L) 12/23/2017   GLUCOSE 143 (H) 12/23/2017   ALT 14 12/19/2017   AST 16 12/19/2017   NA 136 12/23/2017   K 3.8 12/23/2017   CL 98 12/23/2017   CREATININE 0.80 12/23/2017   BUN 8 12/23/2017   CO2 21 (L) 12/23/2017   INR 1.40 12/22/2017   HGBA1C 5.4 12/19/2017   Stable day Lab Results  Component Value Date   INR 1.40 12/22/2017   INR 1.08 12/19/2017   INR 1.05 11/03/2017     Delight Ovens MD  Beeper [phone] Office [phone] 12/23/2017 11:10 PM

## 2017-12-23 NOTE — Progress Notes (Signed)
      [address]       [place] [address]             [phone]        CARDIOTHORACIC SURGERY PROGRESS NOTE   R1 Day Post-Op Procedure(s) (LRB): MINIMALLY INVASIVE MITRAL VALVE REPLACEMENT(MVR) (Right) TRANSESOPHAGEAL ECHOCARDIOGRAM (TEE) (N/A)  Subjective: Looks good.  Reports expected soreness across chest.  No SOB.    Objective: Vital signs: BP Readings from Last 1 Encounters:  12/23/17 104/61   Pulse Readings from Last 1 Encounters:  12/23/17 80   Resp Readings from Last 1 Encounters:  12/22/17 10   Temp Readings from Last 1 Encounters:  12/23/17 99.5 F (37.5 C)    Hemodynamics: PAP: (22-37)/(2-16) 27/14 CO:  [5.4 L/min] 5.4 L/min CI:  [2.5 L/min/m2] 2.5 L/min/m2  Physical Exam:  Rhythm:   sinus  Breath sounds: clear  Heart sounds:  RRR w/ mechanical sounds  Incisions:  Dressings dry, intact  Abdomen:  Soft, non-distended, non-tender  Extremities:  Warm, well-perfused  Chest tubes:  low volume thin serosanguinous output, no air leak    Intake/Output from previous day: 06/27 0701 - 06/28 0700 In: 4578.4 [P.O.:600; I.V.:2379.7; Blood:200; IV Piggyback:1398.7] Out: 2415 [Urine:1570; Blood:400; Chest Tube:445] Intake/Output this shift: No intake/output data recorded.  Lab Results:  CBC: Recent Labs    12/22/17 2010 12/22/17 2024 12/23/17 0331  WBC 8.6  --  9.2  HGB 10.5* 10.2* 10.0*  HCT 33.7* 30.0* 31.9*  PLT 147*  --  129*    BMET:  Recent Labs    12/22/17 2024 12/23/17 0331  NA 140 134*  K 4.2 3.9  CL 107 107  CO2  --  21*  GLUCOSE 158* 118*  BUN 8 7  CREATININE 0.70 0.69  CALCIUM  --  7.1*     PT/INR:   Recent Labs    12/22/17 1339  LABPROT 17.0*  INR 1.40    CBG (last 3)  Recent Labs    12/22/17 1938 12/22/17 2314 12/23/17 0332  GLUCAP 144* 139* 103*    ABG    Component Value Date/Time   PHART 7.342 (L) 12/22/2017 1715   PCO2ART 34.6 12/22/2017 1715   PO2ART 71.0 (L) 12/22/2017 1715   HCO3 18.8 (L) 12/22/2017 1715   TCO2 21 (L) 12/22/2017 2024   ACIDBASEDEF 6.0 (H) 12/22/2017 1715   O2SAT 93.0 12/22/2017 1715    CXR: Looks good.  Mild bibasilar atelectasis R>L  EKG: NSR w/out acute ischemic changes, 1st degree AV block   Assessment/Plan: S/P Procedure(s) (LRB): MINIMALLY INVASIVE MITRAL VALVE REPLACEMENT(MVR) (Right) TRANSESOPHAGEAL ECHOCARDIOGRAM (TEE) (N/A)  Doing very well POD1 Maintaining NSR w/ stable hemodynamics, no drips Breathing comfortably w/ O2 sats 98% on 2 L/min via [place] Expected post op acute blood loss anemia, mild Expected post op atelectasis, mild Chronic diastolic CHF with expected post-op volume excess, weight reportedly 11 lbs > preop   Mobilize  D/C lines and foley  Start lasix to stimulate diuresis  Add toradol and watch renal function  Leave chest tubes in place at least 1 or 2 more days until output decreased  Start Coumadin   Purcell Nails, MD 12/23/2017 7:50 AM

## 2017-12-23 NOTE — Discharge Instructions (Addendum)
Mitral Valve Replacement, Care After °This sheet gives you information about how to care for yourself after your procedure. Your health care provider may also give you more specific instructions. If you have problems or questions, contact your health care provider. °What can I expect after the procedure? °After the procedure, it is common to have: °· Pain at the incision area that may last for several weeks. ° °Follow these instructions at home: °Incision care °· Follow instructions from your health care provider about how to take care of your incision. Make sure you: °? Wash your hands with soap and water before you change your bandage (dressing). If soap and water are not available, use hand sanitizer. °? Change your dressing as told by your health care provider. °? Leave stitches (sutures), skin glue, or adhesive strips in place. These skin closures may need to stay in place for 2 weeks or longer. If adhesive strip edges start to loosen and curl up, you may trim the loose edges. Do not remove adhesive strips completely unless your health care provider tells you to do that. °· Check your incision area every day for signs of infection. Check for: °? More redness, swelling, or pain. °? More fluid or blood. °? Warmth. °? Pus or a bad smell. °· Do not apply powder or lotion to the area. °Driving °· Do not drive until your health care provider approves. °· Do not drive or use heavy machinery while taking prescription pain medicines. °Bathing °· Do not take baths, swim, or use a hot tub for 2-4 weeks after surgery, or until your health care provider approves. Ask your health care provider if you may take showers. °· To wash the incision site, gently wash with soap and water and pat the area dry with a clean towel. Do not rub the incision area. That may cause bleeding. °Activity °· Rest as told by your health care provider. Ask your health care provider when you can resume normal activities, including sexual  activity. °· Avoid the following activities for 6-8 weeks, or as long as directed: °? Lifting anything that is heavier than 10 lb (4.5 kg), or the limit that your health care provider tells you. °? Pushing or pulling things with your arms. °· Avoid climbing stairs and using the handrail to pull yourself up for the first 2-3 weeks after surgery. °· Avoid airplane travel for 4-6 weeks, or as long as directed. °· Avoid sitting for long periods of time and crossing your legs. Get up and move around at least once every 1-2 hours. °· If you are taking blood thinners (anticoagulants), avoid activities that have a high risk of injury. Ask your health care provider what activities are safe for you. °Lifestyle °· Limit alcohol intake to no more than 1 drink a day for nonpregnant women and 2 drinks a day for men. One drink equals 12 oz of beer, 5 oz of wine, or 1½ oz of hard liquor. °· Do not use any products that contain nicotine or tobacco, such as cigarettes and e-cigarettes. If you need help quitting, ask your health care provider. °General instructions °· Take your temperature every day and weigh yourself every morning for the first 7 days after surgery. Write your temperatures and weight down and take this record with you to any follow-up visits. °· Take over-the-counter and prescription medicines only as told by your health care provider. °· To prevent or treat constipation while you are taking prescription pain medicine, your health care provider   may recommend that you: °? Drink enough fluid to keep your urine clear or pale yellow. °? Take over-the-counter or prescription medicines. °? Eat foods that are high in fiber, such as fresh fruits and vegetables, whole grains, and beans. °? Limit foods that are high in fat and processed sugars, such as fried and sweet foods. °· Follow instructions from your health care provider about eating or drinking restrictions. °· Wear compression stockings for at least 2 weeks, or as  long as told by your health care provider. These stockings help to prevent blood clots and reduce swelling in your legs. If your ankles are swollen after 2 weeks, continue to wear the stockings. °· Keep all follow-up visits as told by your health care provider. This is important. °Contact a health care provider if: °· You develop a skin rash. °· Your weight is increasing each day over 2-3 days. °· You gain 2 lb (1 kg) or more in a single day. °· You have a fever. °Get help right away if: °· You develop chest pain that feels different from the pain caused by your incision. °· You develop shortness of breath or difficulty breathing. °· You have more redness, swelling, or pain around your incision. °· You have more fluid or blood coming from your incision. °· Your incision feels warm to the touch. °· You have pus or a bad smell coming from your incision. °· You feel light-headed. °This information is not intended to replace advice given to you by your health care provider. Make sure you discuss any questions you have with your health care provider. °Document Released: 01/01/2005 Document Revised: 03/26/2016 Document Reviewed: 03/26/2016 °Elsevier Interactive Patient Education © 2018 [employer] °Information on my medicine - Coumadin®   (Warfarin) ° °Why was Coumadin prescribed for you? °Coumadin was prescribed for you because you have a blood clot or a medical condition that can cause an increased risk of forming blood clots. Blood clots can cause serious health problems by blocking the flow of blood to the heart, lung, or brain. Coumadin can prevent harmful blood clots from forming. °As a reminder your indication for Coumadin is:   Blood Clot Prevention After Heart Valve Surgery ° °What test will check on my response to Coumadin? °While on Coumadin (warfarin) you will need to have an INR test regularly to ensure that your dose is keeping you in the desired range. The INR (international normalized ratio) number is  calculated from the result of the laboratory test called prothrombin time (PT). ° °If an INR APPOINTMENT HAS NOT ALREADY BEEN MADE FOR YOU please schedule an appointment to have this lab work done by your health care provider within 7 days. °Your INR goal is usually a number between:  2 to 3 or your provider may give you a more narrow range like 2-2.5.  Ask your health care provider during an office visit what your goal INR is. ° °What  do you need to  know  About  COUMADIN? °Take Coumadin (warfarin) exactly as prescribed by your healthcare provider about the same time each day.  DO NOT stop taking without talking to the doctor who prescribed the medication.  Stopping without other blood clot prevention medication to take the place of Coumadin may increase your risk of developing a new clot or stroke.  Get refills before you run out. ° °What do you do if you miss a dose? °If you miss a dose, take it as soon as you remember on the   on the same day then continue your regularly scheduled regimen the next day.  Do not take two doses of Coumadin at the same time.  Important Safety Information A possible side effect of Coumadin (Warfarin) is an increased risk of bleeding. You should call your healthcare provider right away if you experience any of the following: ? Bleeding from an injury or your nose that does not stop. ? Unusual colored urine (red or dark brown) or unusual colored stools (red or black). ? Unusual bruising for unknown reasons. ? A serious fall or if you hit your head (even if there is no bleeding).  Some foods or medicines interact with Coumadin (warfarin) and might alter your response to warfarin. To help avoid this: ? Eat a balanced diet, maintaining a consistent amount of Vitamin K. ? Notify your provider about major diet changes you plan to make. ? Avoid alcohol or limit your intake to 1 drink for women and 2 drinks for men per day. (1 drink is 5 oz. wine, 12 oz. beer, or 1.5 oz.  liquor.)  Make sure that ANY health care provider who prescribes medication for you knows that you are taking Coumadin (warfarin).  Also make sure the healthcare provider who is monitoring your Coumadin knows when you have started a new medication including herbals and non-prescription products.  Coumadin (Warfarin)  Major Drug Interactions  Increased Warfarin Effect Decreased Warfarin Effect  Alcohol (large quantities) Antibiotics (esp. Septra/Bactrim, Flagyl, Cipro) Amiodarone (Cordarone) Aspirin (ASA) Cimetidine (Tagamet) Megestrol (Megace) NSAIDs (ibuprofen, naproxen, etc.) Piroxicam (Feldene) Propafenone (Rythmol SR) Propranolol (Inderal) Isoniazid (INH) Posaconazole (Noxafil) Barbiturates (Phenobarbital) Carbamazepine (Tegretol) Chlordiazepoxide (Librium) Cholestyramine (Questran) Griseofulvin Oral Contraceptives Rifampin Sucralfate (Carafate) Vitamin K   Coumadin (Warfarin) Major Herbal Interactions  Increased Warfarin Effect Decreased Warfarin Effect  Garlic Ginseng Ginkgo biloba Coenzyme Q10 Green tea St. Johns wort    Coumadin (Warfarin) FOOD Interactions  Eat a consistent number of servings per week of foods HIGH in Vitamin K (1 serving =  cup)  Collards (cooked, or boiled & drained) Kale (cooked, or boiled & drained) Mustard greens (cooked, or boiled & drained) Parsley *serving size only =  cup Spinach (cooked, or boiled & drained) Swiss chard (cooked, or boiled & drained) Turnip greens (cooked, or boiled & drained)  Eat a consistent number of servings per week of foods MEDIUM-HIGH in Vitamin K (1 serving = 1 cup)  Asparagus (cooked, or boiled & drained) Broccoli (cooked, boiled & drained, or raw & chopped) Brussel sprouts (cooked, or boiled & drained) *serving size only =  cup Lettuce, raw (green leaf, endive, romaine) Spinach, raw Turnip greens, raw & chopped   These websites have more information on Coumadin (warfarin):   http://www.king-russell.com/; https://www.hines.net/;

## 2017-12-24 ENCOUNTER — Inpatient Hospital Stay (HOSPITAL_COMMUNITY): Payer: Medicaid Other

## 2017-12-24 LAB — BASIC METABOLIC PANEL
ANION GAP: 3 — AB (ref 5–15)
BUN: 7 mg/dL (ref 6–20)
CO2: 27 mmol/L (ref 22–32)
Calcium: 7.4 mg/dL — ABNORMAL LOW (ref 8.9–10.3)
Chloride: 106 mmol/L (ref 98–111)
Creatinine, Ser: 0.74 mg/dL (ref 0.44–1.00)
GFR calc Af Amer: >60 mL/min (ref >60)
Glucose, Bld: 104 mg/dL — ABNORMAL HIGH (ref 70–99)
POTASSIUM: 3.4 mmol/L — AB (ref 3.5–5.1)
Sodium: 136 mmol/L (ref 135–145)

## 2017-12-24 LAB — CBC
HEMATOCRIT: 28.8 % — AB (ref 36.0–46.0)
Hemoglobin: 9 g/dL — ABNORMAL LOW (ref 12.0–15.0)
MCH: 26.5 pg (ref 26.0–34.0)
MCHC: 31.3 g/dL (ref 30.0–36.0)
MCV: 85 fL (ref 78.0–100.0)
Platelets: 112 10*3/uL — ABNORMAL LOW (ref 150–400)
RBC: 3.39 MIL/uL — ABNORMAL LOW (ref 3.87–5.11)
RDW: 13.7 % (ref 11.5–15.5)
WBC: 6.6 10*3/uL (ref 4.0–10.5)

## 2017-12-24 LAB — PROTIME-INR
INR: 1.6
Prothrombin Time: 18.9 seconds — ABNORMAL HIGH (ref 11.4–15.2)

## 2017-12-24 MED ORDER — POTASSIUM CHLORIDE 10 MEQ/50ML IV SOLN
10.0000 meq | INTRAVENOUS | Status: AC
  Administered 2017-12-24 (×6): 10 meq via INTRAVENOUS
  Filled 2017-12-24 (×6): qty 50

## 2017-12-24 MED ORDER — ONDANSETRON HCL 4 MG/2ML IJ SOLN
4.0000 mg | Freq: Four times a day (QID) | INTRAMUSCULAR | Status: DC | PRN
  Administered 2017-12-25 (×2): 4 mg via INTRAVENOUS
  Filled 2017-12-24 (×2): qty 2

## 2017-12-24 MED ORDER — ONDANSETRON HCL 4 MG PO TABS: 4.0000 mg | ORAL_TABLET | Freq: Four times a day (QID) | ORAL | Status: DC | PRN

## 2017-12-24 MED ORDER — MAGNESIUM HYDROXIDE 400 MG/5ML PO SUSP
30.0000 mL | Freq: Every day | ORAL | Status: DC | PRN
  Administered 2017-12-25 – 2017-12-26 (×2): 30 mL via ORAL
  Filled 2017-12-24 (×2): qty 30

## 2017-12-24 MED ORDER — SODIUM CHLORIDE 0.9% FLUSH: 3.0000 mL | INTRAVENOUS | Status: DC | PRN

## 2017-12-24 MED ORDER — SODIUM CHLORIDE 0.9% FLUSH
3.0000 mL | Freq: Two times a day (BID) | INTRAVENOUS | Status: DC
  Administered 2017-12-25 – 2017-12-27 (×6): 3 mL via INTRAVENOUS

## 2017-12-24 MED ORDER — SODIUM CHLORIDE 0.9 % IV SOLN: 250.0000 mL | INTRAVENOUS | Status: DC | PRN

## 2017-12-24 MED ORDER — INSULIN ASPART 100 UNIT/ML ~~LOC~~ SOLN
0.0000 [IU] | Freq: Three times a day (TID) | SUBCUTANEOUS | Status: DC
  Administered 2017-12-27: 2 [IU] via SUBCUTANEOUS

## 2017-12-24 MED ORDER — POTASSIUM CHLORIDE CRYS ER 20 MEQ PO TBCR
40.0000 meq | EXTENDED_RELEASE_TABLET | Freq: Two times a day (BID) | ORAL | Status: DC
  Filled 2017-12-24: qty 2

## 2017-12-24 MED ORDER — FUROSEMIDE 40 MG PO TABS
40.0000 mg | ORAL_TABLET | Freq: Two times a day (BID) | ORAL | Status: DC
  Administered 2017-12-24 – 2017-12-25 (×3): 40 mg via ORAL
  Filled 2017-12-24 (×3): qty 1

## 2017-12-24 MED ORDER — ACETAMINOPHEN 325 MG PO TABS
650.0000 mg | ORAL_TABLET | Freq: Four times a day (QID) | ORAL | Status: DC | PRN
  Administered 2017-12-26 – 2017-12-27 (×4): 650 mg via ORAL
  Filled 2017-12-24 (×4): qty 2

## 2017-12-24 MED ORDER — PANTOPRAZOLE SODIUM 40 MG PO TBEC
40.0000 mg | DELAYED_RELEASE_TABLET | Freq: Every day | ORAL | Status: DC
Start: 2017-12-25 — End: 2017-12-28
  Administered 2017-12-25 – 2017-12-28 (×4): 40 mg via ORAL
  Filled 2017-12-24 (×4): qty 1

## 2017-12-24 MED ORDER — ASPIRIN EC 81 MG PO TBEC
81.0000 mg | DELAYED_RELEASE_TABLET | Freq: Every day | ORAL | Status: DC
Start: 2017-12-25 — End: 2017-12-24

## 2017-12-24 MED ORDER — ASPIRIN EC 81 MG PO TBEC
81.0000 mg | DELAYED_RELEASE_TABLET | Freq: Every day | ORAL | Status: DC
  Administered 2017-12-24 – 2017-12-26 (×3): 81 mg via ORAL
  Filled 2017-12-24 (×3): qty 1

## 2017-12-24 MED ORDER — POTASSIUM CHLORIDE 10 MEQ/50ML IV SOLN: 10.0000 meq | INTRAVENOUS | Status: DC

## 2017-12-24 MED ORDER — MOVING RIGHT ALONG BOOK
Freq: Once | Status: AC
  Administered 2017-12-24: 23:00:00
  Filled 2017-12-24 (×2): qty 1

## 2017-12-24 MED ORDER — MOVING RIGHT ALONG BOOK
Freq: Once | Status: AC
  Administered 2017-12-24: 07:00:00
  Filled 2017-12-24: qty 1

## 2017-12-24 MED ORDER — POTASSIUM CHLORIDE CRYS ER 20 MEQ PO TBCR
20.0000 meq | EXTENDED_RELEASE_TABLET | Freq: Two times a day (BID) | ORAL | Status: DC
Start: 2017-12-24 — End: 2017-12-28
  Administered 2017-12-24 – 2017-12-28 (×8): 20 meq via ORAL
  Filled 2017-12-24 (×6): qty 1

## 2017-12-24 MED ORDER — BISACODYL 5 MG PO TBEC
10.0000 mg | DELAYED_RELEASE_TABLET | Freq: Every day | ORAL | Status: DC | PRN
  Administered 2017-12-27: 10 mg via ORAL
  Filled 2017-12-24: qty 2

## 2017-12-24 MED ORDER — GUAIFENESIN ER 600 MG PO TB12: 600.0000 mg | ORAL_TABLET | Freq: Two times a day (BID) | ORAL | Status: DC | PRN

## 2017-12-24 MED ORDER — BISACODYL 10 MG RE SUPP
10.0000 mg | Freq: Every day | RECTAL | Status: DC | PRN
  Administered 2017-12-27: 10 mg via RECTAL
  Filled 2017-12-24: qty 1

## 2017-12-24 NOTE — Progress Notes (Signed)
Patient ID: Gloria James, female   DOB: [birth year], 42 y.o.   MRN: [id number] TCTS DAILY ICU PROGRESS NOTE                   [address]            [name] [address]          [phone]   2 Days Post-Op Procedure(s) (LRB): MINIMALLY INVASIVE MITRAL VALVE REPLACEMENT(MVR) (Right) TRANSESOPHAGEAL ECHOCARDIOGRAM (TEE) (N/A)  Total Length of Stay:  LOS: 2 days   Subjective: Patient stable 2 days postop has been up ambulating  Objective: Vital signs in last 24 hours: Temp:  [98.1 F (36.7 C)-99.1 F (37.3 C)] 98.4 F (36.9 C) (06/29 1537) Pulse Rate:  [73-94] 84 (06/29 1400) Cardiac Rhythm: Normal sinus rhythm (06/29 0800) Resp:  [21-25] 21 (06/29 1400) BP: (87-119)/(57-77) 113/73 (06/29 1400) SpO2:  [86 %-99 %] 96 % (06/29 1400) Weight:  [235 lb 7.2 oz (106.8 kg)] 235 lb 7.2 oz (106.8 kg) (06/29 0500)  Filed Weights   12/22/17 0558 12/23/17 0409 12/24/17 0500  Weight: 225 lb (102.1 kg) 236 lb 8 oz (107.3 kg) 235 lb 7.2 oz (106.8 kg)    Weight change: -1 lb 0.8 oz (-0.476 kg)   Hemodynamic parameters for last 24 hours:    Intake/Output from previous day: 06/28 0701 - 06/29 0700 In: 2206.3 [P.O.:1380; I.V.:281.6; IV Piggyback:544.7] Out: 2440 [Urine:2120; Chest Tube:320]  Intake/Output this shift: Total I/O In: 20 [I.V.:20] Out: -   Current Meds: Scheduled Meds: . acetaminophen  1,000 mg Oral Q6H  . aspirin EC  81 mg Oral Daily  . bisacodyl  10 mg Oral Daily   Or  . bisacodyl  10 mg Rectal Daily  . Chlorhexidine Gluconate Cloth  6 each Topical Daily  . docusate sodium  200 mg Oral Daily  . enoxaparin (LOVENOX) injection  40 mg Subcutaneous QHS  . furosemide  40 mg Oral BID  . metoprolol tartrate  12.5 mg Oral BID  . pantoprazole  40 mg Oral Daily  . [START ON 12/25/2017] potassium chloride  40 mEq Oral BID  . sertraline  200 mg Oral Daily  . sodium chloride flush  10-40 mL Intracatheter Q12H  . sodium chloride flush  3 mL Intravenous  Q12H  . traZODone  200 mg Oral QHS  . warfarin  5 mg Oral q1800  . Warfarin - Physician Dosing Inpatient   Does not apply q1800   Continuous Infusions: . sodium chloride    . lactated ringers Stopped (12/23/17 0412)  . lactated ringers 10 mL/hr at 12/24/17 0900   PRN Meds:.ALPRAZolam, metoprolol tartrate, morphine injection, ondansetron (ZOFRAN) IV, oxyCODONE, sodium chloride flush, sodium chloride flush, traMADol  General appearance: alert, cooperative and no distress Neurologic: intact Heart: regular rate and rhythm, S1, S2 normal, no murmur, click, rub or gallop and Valve click crisp Lungs: diminished breath sounds bibasilar Abdomen: soft, non-tender; bowel sounds normal; no masses,  no organomegaly Extremities: extremities normal, atraumatic, no cyanosis or edema and Homans sign is negative, no sign of DVT Wound: Wound intact with chest in place  Lab Results: CBC: Recent Labs    12/23/17 0331 12/23/17 1756 12/24/17 0419  WBC 9.2  --  6.6  HGB 10.0* 10.2* 9.0*  HCT 31.9* 30.0* 28.8*  PLT 129*  --  112*   BMET:  Recent Labs    12/23/17 0331  12/23/17 1756 12/24/17 0419  NA 134*  --  136 136  K 3.9  --  3.8 3.4*  CL 107  --  98 106  CO2 21*  --   --  27  GLUCOSE 118*  --  143* 104*  BUN 7  --  8 7  CREATININE 0.69   < > 0.80 0.74  CALCIUM 7.1*  --   --  7.4*   < > = values in this interval not displayed.    CMET: Lab Results  Component Value Date   WBC 6.6 12/24/2017   HGB 9.0 (L) 12/24/2017   HCT 28.8 (L) 12/24/2017   PLT 112 (L) 12/24/2017   GLUCOSE 104 (H) 12/24/2017   ALT 14 12/19/2017   AST 16 12/19/2017   NA 136 12/24/2017   K 3.4 (L) 12/24/2017   CL 106 12/24/2017   CREATININE 0.74 12/24/2017   BUN 7 12/24/2017   CO2 27 12/24/2017   INR 1.60 12/24/2017   HGBA1C 5.4 12/19/2017      PT/INR:  Recent Labs    12/24/17 0419  LABPROT 18.9*  INR 1.60   Radiology: Dg Chest Port 1 View  Result Date: 12/24/2017 CLINICAL DATA:  Atelectasis.   Patient status post heart surgery. EXAM: PORTABLE CHEST 1 VIEW COMPARISON:  Chest radiograph 12/23/2017. FINDINGS: Monitoring leads overlie the patient. Right chest tube remains in place. Left IJ sheath. Interval retraction PA catheter. Stable cardiomegaly. Moderate right pleural effusion with underlying opacities. Left basilar atelectasis. Small left pleural effusion. No pneumothorax. IMPRESSION: Interval retraction PA catheter. Moderate right and small left effusions with underlying atelectasis. Electronically Signed   By: Annia Belt M.D.   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

## 2017-12-25 ENCOUNTER — Other Ambulatory Visit: Payer: Self-pay

## 2017-12-25 LAB — BASIC METABOLIC PANEL
Anion gap: 4 — ABNORMAL LOW (ref 5–15)
BUN: 6 mg/dL (ref 6–20)
CO2: 28 mmol/L (ref 22–32)
Calcium: 7.9 mg/dL — ABNORMAL LOW (ref 8.9–10.3)
Chloride: 104 mmol/L (ref 98–111)
Creatinine, Ser: 0.72 mg/dL (ref 0.44–1.00)
GFR calc Af Amer: >60 mL/min (ref >60)
GFR calc non Af Amer: >60 mL/min (ref >60)
Glucose, Bld: 99 mg/dL (ref 70–99)
Potassium: 3.3 mmol/L — ABNORMAL LOW (ref 3.5–5.1)
Sodium: 136 mmol/L (ref 135–145)

## 2017-12-25 LAB — CBC
HCT: 29.3 % — ABNORMAL LOW (ref 36.0–46.0)
Hemoglobin: 9.2 g/dL — ABNORMAL LOW (ref 12.0–15.0)
MCH: 26.4 pg (ref 26.0–34.0)
MCHC: 31.4 g/dL (ref 30.0–36.0)
MCV: 84 fL (ref 78.0–100.0)
Platelets: 135 10*3/uL — ABNORMAL LOW (ref 150–400)
RBC: 3.49 MIL/uL — ABNORMAL LOW (ref 3.87–5.11)
RDW: 13.7 % (ref 11.5–15.5)
WBC: 6.3 10*3/uL (ref 4.0–10.5)

## 2017-12-25 LAB — GLUCOSE, CAPILLARY
GLUCOSE-CAPILLARY: 84 mg/dL (ref 70–99)
GLUCOSE-CAPILLARY: 104 mg/dL — AB (ref 70–99)
Glucose-Capillary: 104 mg/dL — ABNORMAL HIGH (ref 70–99)

## 2017-12-25 LAB — PROTIME-INR
INR: 2.23
Prothrombin Time: 24.5 seconds — ABNORMAL HIGH (ref 11.4–15.2)

## 2017-12-25 MED ORDER — FUROSEMIDE 40 MG PO TABS
40.0000 mg | ORAL_TABLET | Freq: Every day | ORAL | Status: DC
  Administered 2017-12-26 – 2017-12-28 (×3): 40 mg via ORAL
  Filled 2017-12-25 (×3): qty 1

## 2017-12-25 MED ORDER — CALCIUM CARBONATE ANTACID 500 MG PO CHEW
2.0000 | CHEWABLE_TABLET | Freq: Every day | ORAL | Status: DC | PRN
  Administered 2017-12-25: 400 mg via ORAL
  Filled 2017-12-25: qty 2

## 2017-12-25 MED ORDER — OXYCODONE HCL 5 MG PO TABS
5.0000 mg | ORAL_TABLET | ORAL | Status: DC | PRN
  Administered 2017-12-25 – 2017-12-28 (×19): 10 mg via ORAL
  Filled 2017-12-25 (×19): qty 2

## 2017-12-25 NOTE — Progress Notes (Signed)
R chest tubes removed by protocol by Edrick Oh RN Dressing applied. Pt tolerated well. Informed need to stay in bed for 1 hour. Barbera Setters RN

## 2017-12-25 NOTE — Progress Notes (Addendum)
Patient ID: Gloria James, female   DOB: 1975-12-07, 42 y.o.   MRN: [id number] TCTS DAILY ICU PROGRESS NOTE                   [address]            [place] [address]          [phone]   3 Days Post-Op Procedure(s) (LRB): MINIMALLY INVASIVE MITRAL VALVE REPLACEMENT(MVR) (Right) TRANSESOPHAGEAL ECHOCARDIOGRAM (TEE) (N/A)  Total Length of Stay:  LOS: 3 days   Subjective: Awake alert waiting for 4e bed Objective: Vital signs in last 24 hours: Temp:  [98.2 F (36.8 C)-98.7 F (37.1 C)] 98.7 F (37.1 C) (06/30 0757) Pulse Rate:  [83-97] 97 (06/30 0825) Cardiac Rhythm: Normal sinus rhythm (06/29 2000) Resp:  [18-25] 21 (06/30 0825) BP: (98-119)/(57-87) 119/74 (06/30 0825) SpO2:  [74 %-98 %] 96 % (06/30 0825) Weight:  [236 lb 12.4 oz (107.4 kg)] 236 lb 12.4 oz (107.4 kg) (06/30 0500)  Filed Weights   12/23/17 0409 12/24/17 0500 12/25/17 0500  Weight: 236 lb 8 oz (107.3 kg) 235 lb 7.2 oz (106.8 kg) 236 lb 12.4 oz (107.4 kg)    Weight change: 1 lb 5.2 oz (0.6 kg)   Hemodynamic parameters for last 24 hours:    Intake/Output from previous day: 06/29 0701 - 06/30 0700 In: 710 [P.O.:690; I.V.:20] Out: 385 [Urine:225; Chest Tube:160]  Intake/Output this shift: No intake/output data recorded.  Current Meds: Scheduled Meds: . aspirin EC  81 mg Oral Daily  . enoxaparin (LOVENOX) injection  40 mg Subcutaneous QHS  . furosemide  40 mg Oral BID  . insulin aspart  0-24 Units Subcutaneous TID AC & HS  . metoprolol tartrate  12.5 mg Oral BID  . pantoprazole  40 mg Oral QAC breakfast  . potassium chloride  20 mEq Oral BID  . sertraline  200 mg Oral Daily  . sodium chloride flush  3 mL Intravenous Q12H  . traZODone  200 mg Oral QHS  . warfarin  5 mg Oral q1800  . Warfarin - Physician Dosing Inpatient   Does not apply q1800   Continuous Infusions: . sodium chloride     PRN Meds:.sodium chloride, acetaminophen, ALPRAZolam, bisacodyl **OR** bisacodyl,  guaiFENesin, magnesium hydroxide, ondansetron **OR** ondansetron (ZOFRAN) IV, oxyCODONE, sodium chloride flush, traMADol  General appearance: alert and cooperative Neurologic: intact Heart: regular rate and rhythm, S1, S2 normal, no murmur, click, rub or gallop Lungs: clear to auscultation bilaterally Abdomen: soft, non-tender; bowel sounds normal; no masses,  no organomegaly Extremities: extremities normal, atraumatic, no cyanosis or edema and Homans sign is negative, no sign of DVT Wound: Chest tubes in place     Lab Results: CBC: Recent Labs    12/24/17 0419 12/25/17 0243  WBC 6.6 6.3  HGB 9.0* 9.2*  HCT 28.8* 29.3*  PLT 112* 135*   BMET:  Recent Labs    12/24/17 0419 12/25/17 0243  NA 136 136  K 3.4* 3.3*  CL 106 104  CO2 27 28  GLUCOSE 104* 99  BUN 7 6  CREATININE 0.74 0.72  CALCIUM 7.4* 7.9*    CMET: Lab Results  Component Value Date   WBC 6.3 12/25/2017   HGB 9.2 (L) 12/25/2017   HCT 29.3 (L) 12/25/2017   PLT 135 (L) 12/25/2017   GLUCOSE 99 12/25/2017   ALT 14 12/19/2017   AST 16 12/19/2017   NA 136 12/25/2017   K 3.3 (L) 12/25/2017  CL 104 12/25/2017   CREATININE 0.72 12/25/2017   BUN 6 12/25/2017   CO2 28 12/25/2017   INR 2.23 12/25/2017   HGBA1C 5.4 12/19/2017      PT/INR:  Recent Labs    12/25/17 0243  LABPROT 24.5*  INR 2.23    Lab Results  Component Value Date   INR 2.23 12/25/2017   INR 1.60 12/24/2017   INR 1.40 12/22/2017   Radiology: No results found.   Assessment/Plan: S/P Procedure(s) (LRB): MINIMALLY INVASIVE MITRAL VALVE REPLACEMENT(MVR) (Right) TRANSESOPHAGEAL ECHOCARDIOGRAM (TEE) (N/A) Plan for transfer to step-down: see transfer orders-waiting for bed Replace potassium Continue Coumadin, INR 2.23 today increasing on current dose Chest tube output decreased to 60 overnight, DC pacing wires this morning, chest tubes out later today  Delight Ovens 12/25/2017 8:53 AM

## 2017-12-26 ENCOUNTER — Inpatient Hospital Stay (HOSPITAL_COMMUNITY): Payer: Medicaid Other

## 2017-12-26 LAB — BASIC METABOLIC PANEL
Anion gap: 10 (ref 5–15)
BUN: 5 mg/dL — ABNORMAL LOW (ref 6–20)
CO2: 25 mmol/L (ref 22–32)
Calcium: 8.3 mg/dL — ABNORMAL LOW (ref 8.9–10.3)
Chloride: 97 mmol/L — ABNORMAL LOW (ref 98–111)
Creatinine, Ser: 0.67 mg/dL (ref 0.44–1.00)
GFR calc Af Amer: >60 mL/min (ref >60)
GFR calc non Af Amer: >60 mL/min (ref >60)
Glucose, Bld: 120 mg/dL — ABNORMAL HIGH (ref 70–99)
Potassium: 4.3 mmol/L (ref 3.5–5.1)
Sodium: 132 mmol/L — ABNORMAL LOW (ref 135–145)

## 2017-12-26 LAB — GLUCOSE, CAPILLARY
GLUCOSE-CAPILLARY: 116 mg/dL — AB (ref 70–99)
GLUCOSE-CAPILLARY: 108 mg/dL — AB (ref 70–99)
Glucose-Capillary: 111 mg/dL — ABNORMAL HIGH (ref 70–99)
Glucose-Capillary: 117 mg/dL — ABNORMAL HIGH (ref 70–99)
Glucose-Capillary: 106 mg/dL — ABNORMAL HIGH (ref 70–99)

## 2017-12-26 LAB — CBC
HCT: 29.2 % — ABNORMAL LOW (ref 36.0–46.0)
Hemoglobin: 9.3 g/dL — ABNORMAL LOW (ref 12.0–15.0)
MCH: 26.3 pg (ref 26.0–34.0)
MCHC: 31.8 g/dL (ref 30.0–36.0)
MCV: 82.7 fL (ref 78.0–100.0)
Platelets: 166 10*3/uL (ref 150–400)
RBC: 3.53 MIL/uL — ABNORMAL LOW (ref 3.87–5.11)
RDW: 13.8 % (ref 11.5–15.5)
WBC: 9 10*3/uL (ref 4.0–10.5)

## 2017-12-26 LAB — PROTIME-INR
INR: 3.7
Prothrombin Time: 36.4 seconds — ABNORMAL HIGH (ref 11.4–15.2)

## 2017-12-26 MED ORDER — METOPROLOL TARTRATE 25 MG PO TABS
25.0000 mg | ORAL_TABLET | Freq: Two times a day (BID) | ORAL | Status: DC
  Administered 2017-12-26 – 2017-12-28 (×4): 25 mg via ORAL
  Filled 2017-12-26 (×4): qty 1

## 2017-12-26 NOTE — Plan of Care (Signed)
  Problem: Education: Goal: Knowledge of General Education information will improve Outcome: Progressing   Problem: Clinical Measurements: Goal: Ability to maintain clinical measurements within normal limits will improve Outcome: Progressing   Problem: Elimination: Goal: Will not experience complications related to bowel motility Outcome: Progressing Goal: Will not experience complications related to urinary retention Outcome: Progressing   Problem: Pain Managment: Goal: General experience of comfort will improve Outcome: Progressing

## 2017-12-26 NOTE — Progress Notes (Signed)
CARDIAC REHAB PHASE I   PRE:  Rate/Rhythm: 95 SR  BP:  Sitting: 95/65      SaO2: 88 RA  MODE:  Ambulation: to [place] elevators  POST:  Rate/Rhythm: 109 ST  BP:  Sitting: pt in bathroom    SaO2: 90 RA   Pt ambulated to west elevators independently with front wheel walker, slow steady pace with 2 standing rest breaks. Encouraged pt to sit in chair for meals and walk twice more today. Pt in BR at end of walk. RN made aware. Will continue to follow.  [id number] Reynold Bowen, RN BSN 12/26/2017 9:52 AM

## 2017-12-26 NOTE — Progress Notes (Addendum)
      [address]       [place] [address]             [phone]      4 Days Post-Op Procedure(s) (LRB): MINIMALLY INVASIVE MITRAL VALVE REPLACEMENT(MVR) (Right) TRANSESOPHAGEAL ECHOCARDIOGRAM (TEE) (N/A) Subjective: She is very tired and having some pain at her incision site this morning.   Objective: Vital signs in last 24 hours: Temp:  [98.4 F (36.9 C)-100.7 F (38.2 C)] 100.7 F (38.2 C) (07/01 0358) Pulse Rate:  [94-106] 106 (07/01 0358) Cardiac Rhythm: Heart block (07/01 0722) Resp:  [16-26] 20 (07/01 0358) BP: (101-140)/(58-92) 113/67 (07/01 0358) SpO2:  [92 %-98 %] 98 % (07/01 0358) Weight:  [109.1 kg (240 lb 9.6 oz)] 109.1 kg (240 lb 9.6 oz) (07/01 0358)     Intake/Output from previous day: 06/30 0701 - 07/01 0700 In: 810 [P.O.:800; I.V.:10] Out: 40 [Chest Tube:40] Intake/Output this shift: No intake/output data recorded.  General appearance: alert, cooperative and no distress Heart: sinus tachcardia Lungs: clear to auscultation bilaterally Abdomen: soft, non-tender; bowel sounds normal; no masses,  no organomegaly Extremities: extremities normal, atraumatic, no cyanosis or edema Wound: clean and dry  Lab Results: Recent Labs    12/25/17 0243 12/26/17 0427  WBC 6.3 9.0  HGB 9.2* 9.3*  HCT 29.3* 29.2*  PLT 135* 166   BMET:  Recent Labs    12/25/17 0243 12/26/17 0427  NA 136 132*  K 3.3* 4.3  CL 104 97*  CO2 28 25  GLUCOSE 99 120*  BUN 6 5*  CREATININE 0.72 0.67  CALCIUM 7.9* 8.3*    PT/INR:  Recent Labs    12/26/17 0427  LABPROT 36.4*  INR 3.70   ABG    Component Value Date/Time   PHART 7.342 (L) 12/22/2017 1715   HCO3 18.8 (L) 12/22/2017 1715   TCO2 21 (L) 12/23/2017 1756   ACIDBASEDEF 6.0 (H) 12/22/2017 1715   O2SAT 93.0 12/22/2017 1715   CBG (last 3)  Recent Labs    12/25/17 1640 12/25/17 2200 12/26/17 0639  GLUCAP 104* 104* 111*    Assessment/Plan: S/P Procedure(s) (LRB): MINIMALLY INVASIVE  MITRAL VALVE REPLACEMENT(MVR) (Right) TRANSESOPHAGEAL ECHOCARDIOGRAM (TEE) (N/A)  1. CV-sinus tachycardia rate in the low 100s. BP low at times. Continue ASA and BB.  2. Pulm-tolerating room air with good oxygen saturation. CXR stable with trace right apical pneumo s/p chest tube removal.  3. Renal-creatinine is 0.67, electrolytes okay. Continue lasix daily for fluid overload.  4. H and H is stable, platelets trending up 5. Endo-blood glucose well controlled on current regimen. 6. On coumadin. INR is 3.70. Will hold coumadin today.  7. Pain is not very well controlled. She woke up and stated that she felt stiff and couldn't move because no one woke her up to give her pain medicine.   Plan: Work on pain control. Hold coumadin tonight. INR in the morning. Continue diuretics, remains 15 lbs fluid overloaded.    LOS: 4 days    Gloria James 12/26/2017  I have seen and examined the patient and agree with the assessment and plan as outlined.  Increase metoprolol.  Possible d/c home 1-2 days.  Purcell Nails, MD 12/26/2017 8:41 AM

## 2017-12-27 LAB — GLUCOSE, CAPILLARY
GLUCOSE-CAPILLARY: 109 mg/dL — AB (ref 70–99)
Glucose-Capillary: 128 mg/dL — ABNORMAL HIGH (ref 70–99)
Glucose-Capillary: 115 mg/dL — ABNORMAL HIGH (ref 70–99)
Glucose-Capillary: 118 mg/dL — ABNORMAL HIGH (ref 70–99)

## 2017-12-27 LAB — PROTIME-INR
INR: 3.39
Prothrombin Time: 34 seconds — ABNORMAL HIGH (ref 11.4–15.2)

## 2017-12-27 MED ORDER — WARFARIN SODIUM 4 MG PO TABS
4.0000 mg | ORAL_TABLET | Freq: Every day | ORAL | Status: DC
  Administered 2017-12-27: 4 mg via ORAL
  Filled 2017-12-27 (×2): qty 1

## 2017-12-27 NOTE — Progress Notes (Signed)
Gave patient box of sterile gauze to absorb drainage from under right breast from chest tube insertion. Patient will use to keep area dry and clean.

## 2017-12-27 NOTE — Progress Notes (Signed)
Changed gauze dressing to chest tube insertion sites.  Both sites under right breast are sutured, with moderate serous drainage.  Sites are painful.

## 2017-12-27 NOTE — Progress Notes (Addendum)
      [address]       [place] [address]             [phone]      5 Days Post-Op Procedure(s) (LRB): MINIMALLY INVASIVE MITRAL VALVE REPLACEMENT(MVR) (Right) TRANSESOPHAGEAL ECHOCARDIOGRAM (TEE) (N/A) Subjective: She is still having quite a bit of pain especially when coughing.   Objective: Vital signs in last 24 hours: Temp:  [98.5 F (36.9 C)-100.7 F (38.2 C)] 98.5 F (36.9 C) (07/02 0440) Pulse Rate:  [53-96] 96 (07/02 0440) Cardiac Rhythm: Heart block (07/02 0719) Resp:  [16-23] 23 (07/02 0440) BP: (107-113)/(62-71) 112/71 (07/02 0440) SpO2:  [93 %-100 %] 100 % (07/02 0440) Weight:  [106.4 kg (234 lb 8 oz)] 106.4 kg (234 lb 8 oz) (07/02 0440)     Intake/Output from previous day: 07/01 0701 - 07/02 0700 In: 118 [P.O.:118] Out: -  Intake/Output this shift: No intake/output data recorded.  General appearance: alert, cooperative and no distress Heart: regular rate and rhythm, S1, S2 normal, no murmur, click, rub or gallop Lungs: clear to auscultation bilaterally Abdomen: soft, non-tender; bowel sounds normal; no masses,  no organomegaly Extremities: extremities normal, atraumatic, no cyanosis or edema Wound: clean and dry  Lab Results: Recent Labs    12/25/17 0243 12/26/17 0427  WBC 6.3 9.0  HGB 9.2* 9.3*  HCT 29.3* 29.2*  PLT 135* 166   BMET:  Recent Labs    12/25/17 0243 12/26/17 0427  NA 136 132*  K 3.3* 4.3  CL 104 97*  CO2 28 25  GLUCOSE 99 120*  BUN 6 5*  CREATININE 0.72 0.67  CALCIUM 7.9* 8.3*    PT/INR:  Recent Labs    12/27/17 0303  LABPROT 34.0*  INR 3.39   ABG    Component Value Date/Time   PHART 7.342 (L) 12/22/2017 1715   HCO3 18.8 (L) 12/22/2017 1715   TCO2 21 (L) 12/23/2017 1756   ACIDBASEDEF 6.0 (H) 12/22/2017 1715   O2SAT 93.0 12/22/2017 1715   CBG (last 3)  Recent Labs    12/26/17 1716 12/26/17 2050 12/27/17 0605  GLUCAP 117* 106* 109*    Assessment/Plan: S/P Procedure(s)  (LRB): MINIMALLY INVASIVE MITRAL VALVE REPLACEMENT(MVR) (Right) TRANSESOPHAGEAL ECHOCARDIOGRAM (TEE) (N/A)  1. CV-sinus tachycardia rate in the low 100s. BP stable. Continue ASA and BB.  2. Pulm-tolerating room air with good oxygen saturation. CXR stable with trace right apical pneumo s/p chest tube removal.  3. Renal-creatinine is 0.67, electrolytes okay. Continue lasix daily for fluid overload. She remains about 9 lbs fluid overloaded. 4. H and H is stable, platelets trending up 5. Endo-blood glucose well controlled on current regimen. 6. On coumadin. INR is 3.39. Will hold coumadin again today. 7. Pain is not very well controlled. She woke up and stated that she felt stiff and couldn't move because no one woke her up to give her pain medicine.   Plan: Likely home tomorrow. Continue diuretics for fluid overload. Continue to work on pain control. Continue to ambulate in the halls. Continue incentive spirometer.    LOS: 5 days    Sharlene Dory 12/27/2017  I have seen and examined the patient and agree with the assessment and plan as outlined.  Will restart Coumadin today at reduced dose.  Tentatively plan d/c home tomorrow.  Purcell Nails, MD 12/27/2017 10:13 AM

## 2017-12-27 NOTE — Progress Notes (Addendum)
CARDIAC REHAB PHASE I   PRE:  Rate/Rhythm: 99 SR  BP:  Sitting: 123/106      SaO2: 93 RA  MODE:  Ambulation: 1400 ft 121 peak HR  POST:  Rate/Rhythm: 108 ST  BP:  Sitting: 108/71    SaO2: 95 RA   Pt ambulated 1472ft in hallway independently with front wheel walker. Pt took 3 standing rest breaks. Pt demonstrated 1000 on IS, encouraged pt to continue use during hospital stay and after d/c. Pt educated to shower and monitor her incision sites daily after d/c; and to use gauze or a towel to wick away moisture. Pt given heart healthy diet and exercise guidelines. Will refer to CRP [facility] [place].  [id number] Reynold Bowen, RN BSN 12/27/2017 9:44 AM

## 2017-12-28 LAB — GLUCOSE, CAPILLARY: GLUCOSE-CAPILLARY: 122 mg/dL — AB (ref 70–99)

## 2017-12-28 LAB — PROTIME-INR
INR: 2.58
Prothrombin Time: 27.5 seconds — ABNORMAL HIGH (ref 11.4–15.2)

## 2017-12-28 MED ORDER — ACETAMINOPHEN 325 MG PO TABS: 650.0000 mg | ORAL_TABLET | Freq: Four times a day (QID) | ORAL | Status: DC | PRN

## 2017-12-28 MED ORDER — OXYCODONE HCL 5 MG PO TABS: 5.0000 mg | ORAL_TABLET | Freq: Four times a day (QID) | ORAL | 0 refills | Status: DC | PRN

## 2017-12-28 MED ORDER — POTASSIUM CHLORIDE CRYS ER 20 MEQ PO TBCR: 20.0000 meq | EXTENDED_RELEASE_TABLET | Freq: Every day | ORAL | 1 refills | Status: DC

## 2017-12-28 MED ORDER — WARFARIN SODIUM 4 MG PO TABS: 4.0000 mg | ORAL_TABLET | Freq: Every day | ORAL | 1 refills | Status: DC

## 2017-12-28 MED ORDER — FUROSEMIDE 40 MG PO TABS: 40.0000 mg | ORAL_TABLET | Freq: Every day | ORAL | 1 refills | Status: DC

## 2017-12-28 MED ORDER — METOPROLOL TARTRATE 25 MG PO TABS: 25.0000 mg | ORAL_TABLET | Freq: Two times a day (BID) | ORAL | 1 refills | Status: DC

## 2017-12-28 NOTE — Progress Notes (Signed)
      [address]       [place] [address]             [phone]      6 Days Post-Op Procedure(s) (LRB): MINIMALLY INVASIVE MITRAL VALVE REPLACEMENT(MVR) (Right) TRANSESOPHAGEAL ECHOCARDIOGRAM (TEE) (N/A) Subjective: Still having some pain near the chest tube site.   Objective: Vital signs in last 24 hours: Temp:  [99.1 F (37.3 C)-100.7 F (38.2 C)] 99.2 F (37.3 C) (07/03 0551) Pulse Rate:  [88-100] 94 (07/03 0551) Cardiac Rhythm: Heart block (07/02 1900) Resp:  [13-24] 23 (07/03 0551) BP: (98-120)/(59-74) 120/74 (07/03 0551) SpO2:  [94 %-99 %] 99 % (07/03 0551) Weight:  [109.2 kg (240 lb 11.9 oz)] 109.2 kg (240 lb 11.9 oz) (07/03 0551)     Intake/Output from previous day: 07/02 0701 - 07/03 0700 In: 3 [I.V.:3] Out: -  Intake/Output this shift: No intake/output data recorded.  General appearance: alert, cooperative and no distress Heart: regular rate and rhythm, S1, S2 normal, no murmur, click, rub or gallop Lungs: clear to auscultation bilaterally Abdomen: soft, non-tender; bowel sounds normal; no masses,  no organomegaly Extremities: extremities normal, atraumatic, no cyanosis or edema Wound: clean and dry  Lab Results: Recent Labs    12/26/17 0427  WBC 9.0  HGB 9.3*  HCT 29.2*  PLT 166   BMET:  Recent Labs    12/26/17 0427  NA 132*  K 4.3  CL 97*  CO2 25  GLUCOSE 120*  BUN 5*  CREATININE 0.67  CALCIUM 8.3*    PT/INR:  Recent Labs    12/28/17 0334  LABPROT 27.5*  INR 2.58   ABG    Component Value Date/Time   PHART 7.342 (L) 12/22/2017 1715   HCO3 18.8 (L) 12/22/2017 1715   TCO2 21 (L) 12/23/2017 1756   ACIDBASEDEF 6.0 (H) 12/22/2017 1715   O2SAT 93.0 12/22/2017 1715   CBG (last 3)  Recent Labs    12/27/17 1659 12/27/17 2102 12/28/17 0614  GLUCAP 115* 118* 122*    Assessment/Plan: S/P Procedure(s) (LRB): MINIMALLY INVASIVE MITRAL VALVE REPLACEMENT(MVR) (Right) TRANSESOPHAGEAL ECHOCARDIOGRAM (TEE)  (N/A)  1. CV-NSR in the 90s. BP stable. Continue ASA and BB.  2. Pulm-tolerating room air with good oxygen saturation. CXR stable with trace right apical pneumo s/p chest tube removal.   3. Renal-creatinine is 0.67, electrolytes okay. Continue lasix daily for fluid overload. She remains about 9 lbs fluid overloaded. 4. H and H is stable, platelets trending up 5. Endo-blood glucose well controlled on current regimen. 6. On coumadin. INR is 2.58. Continue 4mg  Coumadin to maintain INR of 3.0 7. Pain is not very well controlled. Continue oxycodone.   Plan: Will continue 4mg  Coumadin on discharge. Will prescribe another week of lasix for fluid overload.     LOS: 6 days    Sharlene Dory 12/28/2017

## 2017-12-28 NOTE — Progress Notes (Signed)
12/28/2017 11:44 AM Discharge AVS meds taken today and those due this evening reviewed.  Follow-up appointments and when to call md reviewed.  D/C IV and TELE.  Questions and concerns addressed.   D/C home per orders. Kathryne Hitch

## 2017-12-28 NOTE — Care Management Note (Signed)
Case Management Note Donn Pierini RN, BSN Unit 4E-Case Manager [phone]  Patient Details  Name: Gloria James MRN: [id number] Date of Birth: [birth year]  Subjective/Objective:   Pt admitted s/p mini MVR                 Action/Plan: PTA pt lived at home with spouse, plan to return home, No CM needs noted for return home.   Expected Discharge Date:  12/28/17               Expected Discharge Plan:  Home/Self Care  In-House Referral:  NA  Discharge planning Services  CM Consult  Post Acute Care Choice:  NA Choice offered to:  NA  DME Arranged:    DME Agency:     HH Arranged:    HH Agency:     Status of Service:  Completed, signed off  If discussed at Long Length of Stay Meetings, dates discussed:    Discharge Disposition: home/self care   Additional Comments:  Darrold Span, RN 12/28/2017, 10:22 AM

## 2017-12-28 NOTE — Discharge Summary (Signed)
Physician Discharge Summary  Patient ID: Gloria James MRN: [id number] DOB/AGE: 01/04/1976 42 y.o.  Admit date: 12/22/2017 Discharge date: 12/28/2017  Admission Diagnoses: Patient Active Problem List   Diagnosis Date Noted  . Chronic diastolic (congestive) heart failure (HCC)   . Anemia   . 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  The patient has been reasonably healthy for most of her life. She has a long history of tobacco abuse as well as chronic anxiety and panic disorder. She describes a long history of mild exertional shortness of breath which she attributed to her tobacco use and obesity. She developed rapidly progressivesymptoms of exertional shortness of breath over the last few months. She developed orthopnea, lower extremity edema, and intermittent resting shortness of breath. She began to experience episodes of PND and subsequently presented to [facility] in [place] likely acute exacerbation of chronic diastolic congestive heart failure and pulmonary edema. CT angiogram of the chest was negative for pulmonary embolus. An echocardiogram performed at that time revealed normal left ventricular systolic function with rheumatic appearing mitral valve and severe mitral stenosis. Symptoms improved with diuretic therapy. Patient was referred for outpatientcardiology consultation and evaluated by GloriaBranchin early James.She subsequently underwent transesophageal echocardiogram and  diagnostic cardiac catheterization on Nov 03, 2017. TEE confirmed the presence of rheumatic appearing mitral valve with moderate mitral stenosis and severe mitral regurgitation. Diagnostic cardiac catheterization revealed normal coronary arteries with mild pulmonary hypertension and large V waves on wedge tracing consistent with severe mitral regurgitation. The patient was referred for elective surgical consultation. Patient is married  And [place]She previously worked as a Gloria James in [place] but she has been out of work since last November. She reports no significant physical limitations although she admits that she does not exercise on a regular basis. She has experienced decreased energy, loss of appetite, and worsening exertional shortness of breath over several months with acute exacerbation in March as noted previously. Since hospital discharge he has been doing better on oral Lasix therapy although she reports that she still gets short of breath with moderate level activity. She has not had chest pain or chest tightness. She has had some dizzy spells without syncope. She has occasional palpitations without any known history of irregular heart rhythms.  Patient is a 42 year old obese female with rheumatic mitral valve disease who returns the office today with tentative plans to proceed with minimally invasive mitral valve replacement later this week. She was originally seen in consultation on Nov 17, 2017. Since then she underwent dental consultation and teeth extraction by Dr. Robin James. She reports no new problems or complaints and she is eager to proceed with surgery as previously planned.     Hospital Course:   [name] after medical optimization underwent a minimally invasive mitral valve replacement with Dr. Cornelius James on 12/22/2017.  She tolerated the procedure well and was transferred to the surgical ICU.  She was extubated  in a timely manner.  Postop day 1 she was maintaining normal sinus rhythm and  was hemodynamically stable on no drips.  She continued to require 2 L of oxygen support.  She did have some expected postoperative acute blood loss anemia and expected postoperative atelectasis.  We discontinued her lines and Foley catheter.  We initiated Lasix for fluid overload.  We added Toradol and continue to watch her renal function closely.  We started Coumadin for her valve thrombus prophylaxis.  Postop day 2 she continued to progress.  We continued her diuretic regimen for fluid overload.  We continue to titrate her Coumadin for mechanical mitral valve.  She was stable to transfer to stepdown unit for continued care.  Postop day 3 we were able to discontinue her pacing wires.  Postop day 4 her INR was supratherapeutic at 3.7 therefore we held a one-time dose of Coumadin.  She remained 15 pounds fluid overloaded, therefore we continued Lasix.  Her creatinine remained stable.  She did have a trace right apical pneumothorax status post chest tube removal.  We restarted her Coumadin the following day at lower dose.  We worked on pain control since this was challenging.  She continued to ambulate in the hall way without much assistance and we continue to encourage incentive spirometry.  Today, she is tolerating room air, her incisions are healing well, she is ambulating with limited assistance, and she is ready for discharge home.  She will be discharged home on 4 mg of Coumadin daily.  She will follow-up with her cardiology office for an INR draw in 48 hours.  I will continue her Lasix prescription since the patient was previously on this medication.  Consults: dental  Significant Diagnostic Studies:   CLINICAL DATA:  Right-sided chest tube removed yesterday  EXAM: CHEST - 2 VIEW  COMPARISON:  Two days ago  FINDINGS: Stable cardiopericardial size. Mitral valve replacement. Trace right apical pneumothorax. Improving  atelectasis with now visible diaphragm. No Kerley lines or effusion.  IMPRESSION: 1. Removed chest tube on the right with trace apical pneumothorax. 2. Improved but still multi segment atelectasis.   Electronically Signed   By: Marnee Spring M.D.   On: 12/26/2017 07:12  Treatments:   Date of Procedure:                12/22/2017  Preoperative Diagnosis:        Rheumatic Valvular Heart Disease  Severe Mitral Regurgitation  Mild Mitral Stenosis  Postoperative Diagnosis:    Same  Procedure:        Minimally-Invasive Mitral Valve Replacement             Sorin Carbomedics Optiform bileaflet mechanical valve (size 93mm, catalog # [id number], serial # [id number])               Surgeon:        Salvatore Decent. Gloria Moras, MD  Assistant:       Jari Favre, PA-C  Anesthesia:    Rosezella Florida, MD  Operative Findings: ? Rheumatic valvular heart disease ? Type IIIA mitral valve dysfunction with severe mitral regurgitation ? Mild-moderate mitral stenosis ? Normal left ventricular systolic function   Discharge Exam: Blood pressure 120/74, pulse 94, temperature 99.2 F (37.3 C), temperature source Oral, resp. rate (!) 23, height 5\' 8"  (1.727 m), weight 109.2 kg (240 lb 11.9 oz), SpO2 99 %.   General appearance: alert, cooperative and no distress Heart: regular rate and rhythm, S1, S2 normal, no murmur, click, rub or gallop Lungs: clear to auscultation bilaterally Abdomen: soft, non-tender; bowel sounds normal; no masses,  no organomegaly Extremities: extremities normal, atraumatic, no cyanosis or edema Wound: clean and dry    Disposition: Discharge disposition: 01-Home or Self Care       Discharge Instructions    Amb Referral to Cardiac Rehabilitation   Complete by:  As directed    Diagnosis:  Valve Replacement   Valve:  Mitral     Allergies as of 12/28/2017   No Known Allergies     Medication List    STOP taking these medications    oxyCODONE-acetaminophen 5-325 MG tablet Commonly known as:  PERCOCET     TAKE these medications   acetaminophen 325 MG tablet Commonly known as:  TYLENOL Take 2 tablets (650 mg total) by mouth every 6 (six) hours as needed for mild pain.   ALPRAZolam 0.5 MG tablet Commonly known as:  XANAX Take 0.5 mg by mouth 4 (four) times daily as needed for anxiety.   fluticasone 50 MCG/ACT nasal spray Commonly known as:  FLONASE Place 2 sprays into both nostrils daily as needed for allergies.   furosemide 40 MG tablet Commonly known as:  LASIX Take 1 tablet (40 mg total) by mouth daily. Start taking on:  12/29/2017 What changed:  how much to take   metoprolol tartrate 25 MG tablet Commonly known as:  LOPRESSOR Take 1 tablet (25 mg total) by mouth 2 (two) times daily.   oxyCODONE 5 MG immediate release tablet Commonly known as:  Oxy IR/ROXICODONE Take 1 tablet (5 mg total) by mouth every 6 (six) hours as needed for severe pain.   potassium chloride SA 20 MEQ tablet Commonly known as:  K-DUR,KLOR-CON Take 1 tablet (20 mEq total) by mouth daily.   PROAIR HFA 108 (90 Base) MCG/ACT inhaler Generic drug:  albuterol Inhale 2 puffs into the lungs every 4 (four) hours as needed for wheezing or shortness of breath.   sertraline 100 MG tablet Commonly known as:  ZOLOFT Take 200 mg by mouth daily.   traZODone 100 MG tablet Commonly known as:  DESYREL Take 200 mg by mouth at bedtime.   warfarin 4 MG tablet Commonly known as:  COUMADIN Take 1 tablet (4 mg total) by mouth daily at 6 PM.      Follow-up Information    Branch, Dorothe Pea, MD. Call in 1 day(s).   Specialty:  Cardiology Why:  You will need to follow-up with your cardiologist in 2 weeks. Please call for an appointment.  Contact information: [address] [place] [address] [phone]        [facility] [address] Office Follow up.   Specialty:  Cardiology Why:  You will need your INR drawn within 48  hours of discharge. Please call asap to arrange appointment.  Contact information: [address], [address] [place] [address] [phone]       Estanislado Pandy, MD. Call in 1 day(s).   Specialty:  Family Medicine Contact information: [address] [place] [address] [phone]        Purcell Nails, MD Follow up.   Specialty:  Cardiothoracic Surgery Why:  Your routine follow-up appointment is on January 16, 2018 at 3 PM.  Please arrive at 2:30 PM for a chest x-ray located at [facility] imaging which is on the first floor of our building. Contact information: [address] [employer] [address] [place] [address] [phone]          The patient has been discharged on:   1.Beta  Blocker:  Yes [ x  ]                              No   [   ]                              If No, reason:  2.Ace Inhibitor/ARB: Yes [   ]                                     No  [ x   ]                                     If No, reason: titration of BB  3.Statin:   Yes [   ]                  No  [ x  ]                  If No, reason:no CAD  4.Marlowe Kays:  Yes  [   ]                  No   [ x  ]                  If No, reason: discontinued by surgeon   Signed: Sharlene Dory 12/28/2017, 10:28 AM

## 2017-12-30 ENCOUNTER — Ambulatory Visit (INDEPENDENT_AMBULATORY_CARE_PROVIDER_SITE_OTHER): Payer: Medicaid Other | Admitting: *Deleted

## 2017-12-30 ENCOUNTER — Telehealth: Payer: Self-pay

## 2017-12-30 DIAGNOSIS — Z954 Presence of other heart-valve replacement: Secondary | ICD-10-CM

## 2017-12-30 DIAGNOSIS — Z5181 Encounter for therapeutic drug level monitoring: Secondary | ICD-10-CM | POA: Insufficient documentation

## 2017-12-30 LAB — POCT INR: INR: 7.5 — AB (ref 2.0–3.0)

## 2017-12-30 LAB — PROTIME-INR: INR: 5.2 — AB (ref 0.9–1.1)

## 2017-12-30 NOTE — Patient Instructions (Signed)
POC INR 7.5   Sent to [facility] lab for STAT PT/INR   PT 53.2  INR 5.2 Hold coumadin tonight and tomorrow night then start 2mg  (1 tablet daily)  Recheck on Tuesday 01/03/18 Pt notified.  Bleeding and fall precautions discussed and she verbalized understanding.

## 2017-12-30 NOTE — Telephone Encounter (Signed)
Gloria James called this afternoon concerned about her chest tube suture site.  She is s/p Mini MVR on 6/27 with Dr. Cornelius Moras. She states that it is draining clear-yellowish fluid most times and pink-tinged others.  She says the the site around the sutures looked red and irritated.  Upon reviewing her chart, I noticed she had trouble with drainage from her chest tube sites.  She was unsuccessful with obtaining a picture throuhg MyChart, so I advised her to clean the area with peroxide and wash it with soap and water.  Also advised her to cover the site if there is still drainage and change the bandage with saturated.  I told her to give the office a call over the weekend if she notices any changes to the sites over the weekend or develops a fever with thick, purulent drainage with an odor from the site.  She acknowledged receipt.  I also made an appointment for her to come into the office to have the sutures removed on Monday, July 8th  at 10:30 as it is time for the sutures to be removed.  She verbalized receipt.

## 2018-01-02 ENCOUNTER — Ambulatory Visit (INDEPENDENT_AMBULATORY_CARE_PROVIDER_SITE_OTHER): Payer: Self-pay | Admitting: *Deleted

## 2018-01-02 DIAGNOSIS — Z4802 Encounter for removal of sutures: Secondary | ICD-10-CM

## 2018-01-02 DIAGNOSIS — Z954 Presence of other heart-valve replacement: Secondary | ICD-10-CM

## 2018-01-03 ENCOUNTER — Ambulatory Visit (INDEPENDENT_AMBULATORY_CARE_PROVIDER_SITE_OTHER): Payer: Medicaid Other | Admitting: *Deleted

## 2018-01-03 DIAGNOSIS — Z954 Presence of other heart-valve replacement: Secondary | ICD-10-CM | POA: Diagnosis not present

## 2018-01-03 DIAGNOSIS — Z5181 Encounter for therapeutic drug level monitoring: Secondary | ICD-10-CM | POA: Diagnosis not present

## 2018-01-03 LAB — POCT INR: INR: 2.2 (ref 2.0–3.0)

## 2018-01-03 NOTE — Patient Instructions (Signed)
Start coumadin 1 tablet daily except 1 1/2 tablets on Tuesday, Thursdays and Saturdays Recheck in 1 week

## 2018-01-03 NOTE — Progress Notes (Signed)
Gloria James returns s/p MINI MVR 12/22/17.  She had called the office with concerns of irritation of the chest tube sites and remaining sutures. A sutue removal appointment had not been made. She sent a picture of the sites via phone and they were irritated in appearance. Therefore this appointment was made for her. The mini and right axillary incisions are healing nicely. There is redness to the chest tube sites and they both tunnel approximately 4 cm after sutures removed. I instructed her in the peroxide/saline cleansing  followed by 1/2" plain nugauze packing " daily" process. She felt her mother could do it. Her Lasix dose was clarified to be 40 mg daily, instead of 20 mg. Otherwise she is doing well. She will return as scheduled on 01/05/18 with a CXR.

## 2018-01-12 ENCOUNTER — Ambulatory Visit (INDEPENDENT_AMBULATORY_CARE_PROVIDER_SITE_OTHER): Payer: Medicaid Other | Admitting: *Deleted

## 2018-01-12 DIAGNOSIS — Z5181 Encounter for therapeutic drug level monitoring: Secondary | ICD-10-CM

## 2018-01-12 DIAGNOSIS — Z954 Presence of other heart-valve replacement: Secondary | ICD-10-CM

## 2018-01-12 LAB — POCT INR: INR: 2.2 (ref 2.0–3.0)

## 2018-01-12 NOTE — Patient Instructions (Signed)
Increase coumadin to 1 1/2 tablets daily except 1 tablet on Sundays and Wednesdays Recheck in 1 week

## 2018-01-13 ENCOUNTER — Other Ambulatory Visit: Payer: Self-pay | Admitting: Thoracic Surgery (Cardiothoracic Vascular Surgery)

## 2018-01-13 ENCOUNTER — Other Ambulatory Visit (HOSPITAL_COMMUNITY)
Admission: RE | Admit: 2018-01-13 | Discharge: 2018-01-13 | Disposition: A | Discharge status: Home / Self Care | Payer: Medicaid Other | Source: Ambulatory Visit | Attending: Cardiology | Admitting: Cardiology

## 2018-01-13 ENCOUNTER — Encounter: Payer: Self-pay | Admitting: Cardiology

## 2018-01-13 ENCOUNTER — Ambulatory Visit (INDEPENDENT_AMBULATORY_CARE_PROVIDER_SITE_OTHER): Payer: Medicaid Other | Admitting: Cardiology

## 2018-01-13 VITALS — BP 110/68 | HR 85 | Ht 68.0 in | Wt 221.0 lb

## 2018-01-13 DIAGNOSIS — Z952 Presence of prosthetic heart valve: Secondary | ICD-10-CM

## 2018-01-13 DIAGNOSIS — Z954 Presence of other heart-valve replacement: Secondary | ICD-10-CM

## 2018-01-13 DIAGNOSIS — D649 Anemia, unspecified: Secondary | ICD-10-CM | POA: Diagnosis not present

## 2018-01-13 LAB — BASIC METABOLIC PANEL
Anion gap: 9 (ref 5–15)
BUN: 9 mg/dL (ref 6–20)
CALCIUM: 9 mg/dL (ref 8.9–10.3)
CO2: 25 mmol/L (ref 22–32)
CREATININE: 0.96 mg/dL (ref 0.44–1.00)
Chloride: 104 mmol/L (ref 98–111)
GFR calc non Af Amer: >60 mL/min (ref >60)
Glucose, Bld: 92 mg/dL (ref 70–99)
Potassium: 3.7 mmol/L (ref 3.5–5.1)
SODIUM: 138 mmol/L (ref 135–145)

## 2018-01-13 LAB — CBC
HEMATOCRIT: 31.2 % — AB (ref 36.0–46.0)
Hemoglobin: 9.7 g/dL — ABNORMAL LOW (ref 12.0–15.0)
MCH: 24.9 pg — ABNORMAL LOW (ref 26.0–34.0)
MCHC: 31.1 g/dL (ref 30.0–36.0)
MCV: 80 fL (ref 78.0–100.0)
Platelets: 279 10*3/uL (ref 150–400)
RBC: 3.9 MIL/uL (ref 3.87–5.11)
RDW: 14.5 % (ref 11.5–15.5)
WBC: 5.7 10*3/uL (ref 4.0–10.5)

## 2018-01-13 MED ORDER — ASPIRIN EC 81 MG PO TBEC: 81.0000 mg | DELAYED_RELEASE_TABLET | Freq: Every day | ORAL | 3 refills | Status: DC

## 2018-01-13 NOTE — Progress Notes (Signed)
Clinical Summary Ms. Mccreadie is a 42 y.o.female seen today for follow up of the following medical problems.     1. Rheumatic MV disease - - 08/2017 echo [facility]: rheumatic MV described, severe MS. Mean gradient 15. MVA by PHT 1.6. HR 84 during study.  - 10/2017 TEE moderate MS, severe MR - referred for surgery - 12/22/17 had MVR with sorin carbomedical optiform bileaflet mechanical valve 31 mm - f/u with CT surgery next week - on coumadin   - no recent SOB, signifiacnt improvement. Improved LE edema - compliant with coumadin.     3. Pulmonary nodule - followed by pcp   Past Medical History:  Diagnosis Date  . Acute pulmonary edema (HCC)   . Anemia   . CHF (congestive heart failure) (HCC)   . Chronic diastolic (congestive) heart failure (HCC)   . Depression   . Dyspnea   . Dysrhythmia    Patient states she would have episodes for tachycardia in the past  . History of kidney stones   . Panic disorder without agoraphobia   . PONV (postoperative nausea and vomiting)   . Precordial pain   . Rheumatic mitral stenosis with regurgitation   . 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 Tricuspid regurgitation      No Known Allergies   Current Outpatient Medications  Medication Sig Dispense Refill  . acetaminophen (TYLENOL) 325 MG tablet Take 2 tablets (650 mg total) by mouth every 6 (six) hours as needed for mild pain.    Marland Kitchen albuterol (PROAIR HFA) 108 (90 Base) MCG/ACT inhaler Inhale 2 puffs into the lungs every 4 (four) hours as needed for wheezing or shortness of breath.     . ALPRAZolam (XANAX) 0.5 MG tablet Take 0.5 mg by mouth 4 (four) times daily as needed for anxiety.     . fluticasone (FLONASE) 50 MCG/ACT nasal spray Place 2 sprays into both nostrils daily as needed for allergies.     . furosemide (LASIX) 40 MG  tablet Take 1 tablet (40 mg total) by mouth daily. 30 tablet 1  . metoprolol tartrate (LOPRESSOR) 25 MG tablet Take 1 tablet (25 mg total) by mouth 2 (two) times daily. 60 tablet 1  . oxyCODONE (OXY IR/ROXICODONE) 5 MG immediate release tablet Take 1 tablet (5 mg total) by mouth every 6 (six) hours as needed for severe pain. 30 tablet 0  . potassium chloride (K-DUR,KLOR-CON) 20 MEQ tablet Take 1 tablet (20 mEq total) by mouth daily. 30 tablet 1  . sertraline (ZOLOFT) 100 MG tablet Take 200 mg by mouth daily.     . traZODone (DESYREL) 100 MG tablet Take 200 mg by mouth at bedtime.     Marland Kitchen warfarin (COUMADIN) 4 MG tablet Take 1 tablet (4 mg total) by mouth daily at 6 PM. 30 tablet 1   No current facility-administered medications for this visit.      Past Surgical History:  Procedure Laterality Date  . APPENDECTOMY     age 46  . CARDIAC CATHETERIZATION  11/03/2017  . MITRAL VALVE REPAIR Right 12/22/2017   Procedure: MINIMALLY INVASIVE MITRAL VALVE REPLACEMENT(MVR);  Surgeon: Purcell Nails, MD;  Location: [facility] OR;  Service: Open Heart Surgery;  Laterality: Right;  . MULTIPLE EXTRACTIONS WITH ALVEOLOPLASTY N/A 12/06/2017   Procedure: Extraction of tooth #'s 4-6, 8-12, 20,22-29 and 32 with alveoloplasty;  Surgeon: Charlynne Pander, DDS;  Location: [facility] OR;  Service: Oral Surgery;  Laterality: N/A;  . MULTIPLE TOOTH EXTRACTIONS  2015  . OTHER SURGICAL HISTORY Left 2016   open surgery for kidney stones  . RIGHT AND LEFT HEART CATH N/A 11/03/2017   Procedure: RIGHT AND LEFT HEART CATH;  Surgeon: Kathleene Hazel, MD;  Location: [facility] INVASIVE CV LAB;  Service: Cardiovascular;  Laterality: N/A;  . TEE WITHOUT CARDIOVERSION N/A 11/03/2017   Procedure: TRANSESOPHAGEAL ECHOCARDIOGRAM (TEE);  Surgeon: Lewayne Bunting, MD;  Location: [facility] ENDOSCOPY;  Service: Cardiovascular;  Laterality: N/A;  . TEE WITHOUT CARDIOVERSION N/A 12/22/2017   Procedure: TRANSESOPHAGEAL ECHOCARDIOGRAM (TEE);  Surgeon: Purcell Nails, MD;  Location: [facility] OR;  Service: Open Heart Surgery;  Laterality: N/A;  . TONSILLECTOMY     6     No Known Allergies    Family History  Problem Relation Age of Onset  . Cancer Father   . Dementia Maternal Grandmother   . Heart disease Maternal Grandfather        had open heart problems  . Cancer Paternal Grandmother   . Cancer Paternal Grandfather      Social History Ms. Hatler reports that she quit smoking about 3 months ago. Her smoking use included cigarettes. She started smoking about 26 years ago. She has a 13.00 pack-year smoking history. She has never used smokeless tobacco. Ms. Vickrey reports that she drank alcohol.   Review of Systems CONSTITUTIONAL: No weight loss, fever, chills, weakness or fatigue.  HEENT: Eyes: No visual loss, blurred vision, double vision or yellow sclerae.No hearing loss, sneezing, congestion, runny nose or sore throat.  SKIN: No rash or itching.  CARDIOVASCULAR: per hpi RESPIRATORY: No shortness of breath, cough or sputum.  GASTROINTESTINAL: No anorexia, nausea, vomiting or diarrhea. No abdominal pain or blood.  GENITOURINARY: No burning on urination, no polyuria NEUROLOGICAL: No headache, dizziness, syncope, paralysis, ataxia, numbness or tingling in the extremities. No change in bowel or bladder control.  MUSCULOSKELETAL: No muscle, back pain, joint pain or stiffness.  LYMPHATICS: No enlarged nodes. No history of splenectomy.  PSYCHIATRIC: No history of depression or anxiety.  ENDOCRINOLOGIC: No reports of sweating, cold or heat intolerance. No polyuria or polydipsia.  Marland Kitchen   Physical Examination Vitals:   01/13/18 1348  BP: 110/68  Pulse: 85  SpO2: 97%   Vitals:   01/13/18 1348  Weight: 221 lb (100.2 kg)  Height: 5\' 8"  (1.727 m)    Gen: resting comfortably, no acute distress HEENT: no scleral icterus, pupils equal round and reactive, no palptable cervical adenopathy,  CV: RRR, no m/r/g, no jvd Resp: Clear to  auscultation bilaterally GI: abdomen is soft, non-tender, non-distended, normal bowel sounds, no hepatosplenomegaly MSK: extremities are warm, no edema.  Skin: warm, no rash Neuro:  no focal deficits Psych: appropriate affect   Diagnostic Studies  10/2017 echo Study Conclusions  - Left ventricle: Systolic function was normal. The estimated   ejection fraction was in the range of 55% to 60%. Wall motion was   normal; there were no regional wall motion abnormalities. - Aortic valve: There was trivial regurgitation. - Mitral valve: Mild thickening, consistent with rheumatic disease.   Mobility of the posterior leaflet was severely restricted. The   findings are consistent with moderate stenosis. There was severe   regurgitation. Valve area by pressure half-time: 1.76 cm^2. - Left atrium: The atrium was severely dilated. No evidence of   thrombus in the atrial cavity or appendage. -  Atrial septum: No defect or patent foramen ovale was identified. - Tricuspid valve: No evidence of vegetation. There was   mild-moderate regurgitation. - Pulmonic valve: No evidence of vegetation.  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   10/2017 cath 1. NO angiographic evidence of CAD 2. Large v-wave on wedge tracing c/w severe MR  Recommendations: Continue workup for surgical MV replacement/repair. I do not think she will be a candidate for balloon valvuloplasty given her severe MR.    Assessment and Plan  1. Mitral valve replacement/Mitral stenosis - doing well after surgery, signficant improvement in her breathing - would add ASA to her coumadin in setting of  mechanical MV.  - recheck BMET/CBC after her surgery - refer to cardiac rehab  2. Anemia - after surgery, repeat cbc  F/u 4 months  Antoine Poche, M.D.

## 2018-01-13 NOTE — Patient Instructions (Addendum)
Your physician wants you to follow-up in: 4 months with Dr.Branch in the [facility] office    START Aspirin 81 mg daily   Get lab work today: BMET,CBC   You have been referred to Cardiac Rehab. They will call you to set up apt.    No testing ordered today      Thank you for choosing [facility] !

## 2018-01-16 ENCOUNTER — Ambulatory Visit (INDEPENDENT_AMBULATORY_CARE_PROVIDER_SITE_OTHER): Payer: Self-pay | Admitting: Surgical

## 2018-01-16 ENCOUNTER — Ambulatory Visit
Admission: RE | Admit: 2018-01-16 | Discharge: 2018-01-16 | Disposition: A | Discharge status: Home / Self Care | Payer: Medicaid Other | Source: Ambulatory Visit | Attending: Thoracic Surgery (Cardiothoracic Vascular Surgery) | Admitting: Thoracic Surgery (Cardiothoracic Vascular Surgery)

## 2018-01-16 VITALS — BP 111/72 | HR 88 | Resp 20 | Ht 68.0 in | Wt 222.0 lb

## 2018-01-16 DIAGNOSIS — Z954 Presence of other heart-valve replacement: Secondary | ICD-10-CM

## 2018-01-16 NOTE — Patient Instructions (Signed)
You may drive.  As discussed continue local measures for muscle discomfort.  Continue with a cardiac rehab program.  Continue with Coumadin management through your cardiologist office.

## 2018-01-16 NOTE — Progress Notes (Signed)
[address]       [place] [address]             [phone]      PAMA ROSKOS [facility] Medical Record #[id number] Date of Birth: [birth year]  Referring: Antoine Poche, MD Primary Care: Estanislado Pandy, MD Primary Cardiologist: Dina Rich, MD   Chief Complaint:   POST OP FOLLOW UP  Date of Procedure:                12/22/2017  Preoperative Diagnosis:        Rheumatic Valvular Heart Disease  Severe Mitral Regurgitation  Mild Mitral Stenosis  Postoperative Diagnosis:    Same  Procedure:        Minimally-Invasive Mitral Valve Replacement             Sorin [employer] Optiform bileaflet mechanical valve (size 31mm, catalog # [id number], serial # [id number])               Surgeon:        Salvatore Decent. Cornelius Moras, MD  Assistant:       Jari Favre, PA-C  Anesthesia:    Rosezella Florida, MD  Operative Findings: ? Rheumatic valvular heart disease ? Type IIIA mitral valve dysfunction with severe mitral regurgitation ? Mild-moderate mitral stenosis ? Normal left ventricular systolic function     History of Present Illness:    The patient is a 42 year old female status post the above described procedure.  She is seen in the office on today's date and routine postsurgical follow-up.  He does have some ongoing discomfort in her right shoulder and pectoral muscles that has shown good and gradual improvement.  She has a small amount of drainage currently from her chest tube sites but this is improved and the depth of the wound no longer requires packing.  She denies fevers but she does have some chills at night.  She is having some difficulty emotionally with some intermittent depression but in general she does feel as though her overall condition is improving.  She denies shortness of breath or palpitations.  She had some lower extremity edema but this has shown some ongoing improvement.  She is been seen by cardiology and is scheduled to  start cardiac rehab soon.  She is getting her INR checked and Coumadin dosage adjusted.      Past Medical History:  Diagnosis Date  . Acute pulmonary edema (HCC)   . Anemia   . CHF (congestive heart failure) (HCC)   . Chronic diastolic (congestive) heart failure (HCC)   . Depression   . Dyspnea   . Dysrhythmia    Patient states she would have episodes for tachycardia in the past  . History of kidney stones   . Panic disorder without agoraphobia   . PONV (postoperative nausea and vomiting)   . Precordial pain   . Rheumatic mitral stenosis with regurgitation   . 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 Tricuspid regurgitation      Social History   Tobacco Use  Smoking Status Former Smoker  . Packs/day: 0.50  . Years: 26.00  . Pack years: 13.00  . Types: Cigarettes  . Start date: 12/18/1991  . Last attempt to quit: 09/23/2017  . Years since quitting: 0.3  Smokeless Tobacco Never Used    Social History  Substance and Sexual Activity  Alcohol Use Not Currently     No Known Allergies  Current Outpatient Medications  Medication Sig Dispense Refill  . acetaminophen (TYLENOL) 325 MG tablet Take 2 tablets (650 mg total) by mouth every 6 (six) hours as needed for mild pain.    Marland Kitchen albuterol (PROAIR HFA) 108 (90 Base) MCG/ACT inhaler Inhale 2 puffs into the lungs every 4 (four) hours as needed for wheezing or shortness of breath.     . ALPRAZolam (XANAX) 0.5 MG tablet Take 0.5 mg by mouth 4 (four) times daily as needed for anxiety.     Marland Kitchen aspirin EC 81 MG tablet Take 1 tablet (81 mg total) by mouth daily. 90 tablet 3  . fluticasone (FLONASE) 50 MCG/ACT nasal spray Place 2 sprays into both nostrils daily as needed for allergies.     . furosemide (LASIX) 40 MG tablet Take 1 tablet (40 mg total) by mouth daily. 30 tablet 1  .  metoprolol tartrate (LOPRESSOR) 25 MG tablet Take 1 tablet (25 mg total) by mouth 2 (two) times daily. 60 tablet 1  . potassium chloride (K-DUR,KLOR-CON) 20 MEQ tablet Take 1 tablet (20 mEq total) by mouth daily. 30 tablet 1  . sertraline (ZOLOFT) 100 MG tablet Take 200 mg by mouth daily.     . traZODone (DESYREL) 100 MG tablet Take 200 mg by mouth at bedtime.     Marland Kitchen warfarin (COUMADIN) 4 MG tablet Take 1 tablet (4 mg total) by mouth daily at 6 PM. 30 tablet 1  . oxyCODONE (OXY IR/ROXICODONE) 5 MG immediate release tablet Take 1 tablet (5 mg total) by mouth every 6 (six) hours as needed for severe pain. (Patient not taking: Reported on 01/16/2018) 30 tablet 0   No current facility-administered medications for this visit.        Physical Exam: BP 111/72   Pulse 88   Resp 20   Ht 5\' 8"  (1.727 m)   Wt 100.7 kg (222 lb)   SpO2 97% Comment: RA  BMI 33.75 kg/m   General appearance: alert, cooperative and no distress Heart: regular rate and rhythm and ejection click present Lungs: clear to auscultation bilaterally Abdomen: soft, non-tender; bowel sounds normal; no masses,  no organomegaly Extremities: extremities normal, atraumatic, no cyanosis or edema Wound: Incisions healing well without evidence of infection   Diagnostic Studies & Laboratory data:     Recent Radiology Findings:   Dg Chest 2 View  Result Date: 01/16/2018 CLINICAL DATA:  Postop mitral valve replacement on 12/22/2017. EXAM: CHEST - 2 VIEW COMPARISON:  12/26/2017, 12/24/2017 and earlier, including CTA chest 12/19/2017. FINDINGS: Cardiac silhouette mildly enlarged, unchanged. Mitral valve replacement. Hilar and mediastinal contours otherwise unremarkable. Residual minimal to mild atelectasis involving the bilateral lower lobes and right middle lobe, improved since the examination 3 weeks ago. No new pulmonary parenchymal abnormalities. No pleural effusions. Normal pulmonary vascularity. Interval resolution of the RIGHT  apical pneumothorax. Visualized bony thorax intact. IMPRESSION: Residual minimal to mild atelectasis involving the bilateral lower lobes and the right middle lobe, improved since the examination 3 weeks ago. No acute cardiopulmonary disease otherwise. Electronically Signed   By: Hulan Saas M.D.   On: 01/16/2018 14:24      Recent Lab Findings: Lab Results  Component Value Date   WBC 5.7 01/13/2018   HGB 9.7 (L) 01/13/2018   HCT 31.2 (L) 01/13/2018   PLT 279 01/13/2018   GLUCOSE 92 01/13/2018   ALT 14 12/19/2017   AST  16 12/19/2017   NA 138 01/13/2018   K 3.7 01/13/2018   CL 104 01/13/2018   CREATININE 0.96 01/13/2018   BUN 9 01/13/2018   CO2 25 01/13/2018   INR 2.2 01/12/2018   HGBA1C 5.4 12/19/2017      Assessment / Plan: She continues to have excellent ongoing recovery.  We discussed some local measures as far as to assist with pain management including moist heat, gentle stretching and generally being more active.  I do not feel that narcotics or muscle relaxers would be appropriate at this time and she agrees that Tylenol is doing a reasonable job at twice daily dosing.  I did suggest that for the next week or so she could take it up to 4 times a day.  I did not adjust any of her other medications.  We will see her again in 2 months for routine follow-up.          Rowe Clack, PA-C 01/16/2018 3:06 PM Pager [phone]

## 2018-01-19 ENCOUNTER — Ambulatory Visit (INDEPENDENT_AMBULATORY_CARE_PROVIDER_SITE_OTHER): Payer: Medicaid Other | Admitting: *Deleted

## 2018-01-19 DIAGNOSIS — Z954 Presence of other heart-valve replacement: Secondary | ICD-10-CM | POA: Diagnosis not present

## 2018-01-19 DIAGNOSIS — Z5181 Encounter for therapeutic drug level monitoring: Secondary | ICD-10-CM

## 2018-01-19 LAB — POCT INR: INR: 3.2 — AB (ref 2.0–3.0)

## 2018-01-19 NOTE — Patient Instructions (Signed)
Continue coumadin 1 1/2 tablets daily except 1 tablet on Sundays and Wednesdays Recheck in 1 week

## 2018-01-23 ENCOUNTER — Encounter: Payer: Self-pay | Admitting: Cardiology

## 2018-01-26 ENCOUNTER — Ambulatory Visit (INDEPENDENT_AMBULATORY_CARE_PROVIDER_SITE_OTHER): Payer: Medicaid Other | Admitting: *Deleted

## 2018-01-26 DIAGNOSIS — Z954 Presence of other heart-valve replacement: Secondary | ICD-10-CM | POA: Diagnosis not present

## 2018-01-26 DIAGNOSIS — Z5181 Encounter for therapeutic drug level monitoring: Secondary | ICD-10-CM

## 2018-01-26 LAB — POCT INR: INR: 2.9 (ref 2.0–3.0)

## 2018-01-26 NOTE — Patient Instructions (Signed)
Continue coumadin 1 1/2 tablets daily except 1 tablet on Sundays and Wednesdays Recheck in 2 weeks

## 2018-02-14 ENCOUNTER — Ambulatory Visit (INDEPENDENT_AMBULATORY_CARE_PROVIDER_SITE_OTHER): Payer: Medicaid Other | Admitting: Pharmacist

## 2018-02-14 DIAGNOSIS — Z954 Presence of other heart-valve replacement: Secondary | ICD-10-CM | POA: Diagnosis not present

## 2018-02-14 DIAGNOSIS — Z5181 Encounter for therapeutic drug level monitoring: Secondary | ICD-10-CM | POA: Diagnosis not present

## 2018-02-14 LAB — POCT INR: INR: 2.4 (ref 2.0–3.0)

## 2018-02-14 NOTE — Patient Instructions (Signed)
Description   Take an extra 1/2 tablet today (since already took 1.5 tablets) then Continue coumadin 1 1/2 tablets daily except 1 tablet on Sundays and Wednesdays Recheck in 2 weeks

## 2018-02-20 ENCOUNTER — Telehealth: Payer: Self-pay | Admitting: Cardiology

## 2018-02-20 ENCOUNTER — Encounter: Payer: Self-pay | Admitting: *Deleted

## 2018-02-20 DIAGNOSIS — I5032 Chronic diastolic (congestive) heart failure: Secondary | ICD-10-CM

## 2018-02-20 NOTE — Telephone Encounter (Signed)
Notes received from [facility] .  Patient stated she was given IV Lasix & BNP was 900.  Did also do chest x-ray.  Stated that they did not think she was bed enough to be admitted & was told to contact our office today.  No c/o chest pain, dizziness.  Weight now 228.  Lost 4 lbs since ED visit.  Normally her weight is around 222 on a regular.  SOB is worse with lying flat and exertion.  Did not go home on any new medications.  Notes faxed to [facility] office for Dr. [name] review.

## 2018-02-20 NOTE — Telephone Encounter (Signed)
Patient was advised by [facility] to contact our office this mornign.   Patient was in ED over the weekend SOB and was told she had fluid in his lungs

## 2018-02-20 NOTE — Telephone Encounter (Signed)
[facility] notes requested.  

## 2018-02-21 NOTE — Telephone Encounter (Signed)
Verify she has been taking lasix 40mg  daily, if so then increase to 60mg  daily. Needs BMET/Mg in 2 weeks. Update [place] Friday on her symptoms.     Dina Rich MD

## 2018-02-23 MED ORDER — METOPROLOL TARTRATE 25 MG PO TABS: 25.0000 mg | ORAL_TABLET | Freq: Two times a day (BID) | ORAL | 1 refills | Status: DC

## 2018-02-23 MED ORDER — FUROSEMIDE 40 MG PO TABS: 60.0000 mg | ORAL_TABLET | Freq: Every day | ORAL | 1 refills | Status: DC

## 2018-02-23 MED ORDER — POTASSIUM CHLORIDE CRYS ER 20 MEQ PO TBCR
20.0000 meq | EXTENDED_RELEASE_TABLET | Freq: Every day | ORAL | 1 refills | Status: DC
Start: 2018-02-23 — End: 2018-07-21

## 2018-02-23 NOTE — Telephone Encounter (Signed)
Pt aware and will mail lab orders as requested    I spoke with this patient about some disability papers today. She needs refill on her lopressor, increase lasix to 60mg  daily with BMET/Mg in 2 weeks, and update [place] on her weights on Monday    Dominga Ferry MD

## 2018-02-28 ENCOUNTER — Ambulatory Visit (INDEPENDENT_AMBULATORY_CARE_PROVIDER_SITE_OTHER): Payer: Medicaid Other | Admitting: *Deleted

## 2018-02-28 DIAGNOSIS — Z5181 Encounter for therapeutic drug level monitoring: Secondary | ICD-10-CM

## 2018-02-28 DIAGNOSIS — Z954 Presence of other heart-valve replacement: Secondary | ICD-10-CM | POA: Diagnosis not present

## 2018-02-28 LAB — POCT INR: INR: 2.1 (ref 2.0–3.0)

## 2018-02-28 NOTE — Patient Instructions (Signed)
Take coumadin 2 tablets tonight then increase dose to 1 1/2 tablets daily Recheck in 2 weeks

## 2018-03-09 ENCOUNTER — Encounter (HOSPITAL_COMMUNITY): Payer: Medicaid Other

## 2018-03-15 ENCOUNTER — Ambulatory Visit (INDEPENDENT_AMBULATORY_CARE_PROVIDER_SITE_OTHER): Payer: Medicaid Other | Admitting: *Deleted

## 2018-03-15 DIAGNOSIS — Z954 Presence of other heart-valve replacement: Secondary | ICD-10-CM

## 2018-03-15 DIAGNOSIS — Z5181 Encounter for therapeutic drug level monitoring: Secondary | ICD-10-CM

## 2018-03-15 LAB — POCT INR: INR: 2.2 (ref 2.0–3.0)

## 2018-03-15 NOTE — Patient Instructions (Signed)
Increase coumadin to 1 1/2 tablets daily except 2 tablets on Wednesdays and Saturdays Recheck in 2 weeks

## 2018-03-17 ENCOUNTER — Other Ambulatory Visit: Payer: Self-pay | Admitting: Thoracic Surgery (Cardiothoracic Vascular Surgery)

## 2018-03-17 DIAGNOSIS — Z952 Presence of prosthetic heart valve: Secondary | ICD-10-CM

## 2018-03-20 ENCOUNTER — Other Ambulatory Visit: Payer: Self-pay | Admitting: *Deleted

## 2018-03-20 ENCOUNTER — Encounter: Payer: Self-pay | Admitting: Thoracic Surgery (Cardiothoracic Vascular Surgery)

## 2018-03-20 ENCOUNTER — Ambulatory Visit (INDEPENDENT_AMBULATORY_CARE_PROVIDER_SITE_OTHER): Payer: Self-pay | Admitting: Thoracic Surgery (Cardiothoracic Vascular Surgery)

## 2018-03-20 ENCOUNTER — Ambulatory Visit
Admission: RE | Admit: 2018-03-20 | Discharge: 2018-03-20 | Disposition: A | Discharge status: Home / Self Care | Payer: Medicaid Other | Source: Ambulatory Visit | Attending: Thoracic Surgery (Cardiothoracic Vascular Surgery) | Admitting: Thoracic Surgery (Cardiothoracic Vascular Surgery)

## 2018-03-20 ENCOUNTER — Other Ambulatory Visit: Payer: Self-pay

## 2018-03-20 VITALS — BP 110/68 | HR 79 | Resp 18 | Ht 68.0 in | Wt 235.6 lb

## 2018-03-20 DIAGNOSIS — Z952 Presence of prosthetic heart valve: Secondary | ICD-10-CM

## 2018-03-20 DIAGNOSIS — Z954 Presence of other heart-valve replacement: Secondary | ICD-10-CM

## 2018-03-20 NOTE — Patient Instructions (Addendum)
Continue all previous medications without any changes at this time  You may resume unrestricted physical activity without any particular limitations at this time.  Check your weight on a regular basis and keep a log for your records.  Look for signs of fluid overload such as worsening swelling of your lower legs, increased shortness of breath with activity, and/or a dry nonproductive cough.  Discussed these findings with your cardiologist including whether or not you should adjust your fluid pill dosage (diuretic).  Make every effort to stay physically active, get some type of exercise on a regular basis, and stick to a "heart healthy diet".  The long term benefits for regular exercise and a healthy diet are critically important to your overall health and wellbeing.     Endocarditis is a potentially serious infection of heart valves or inside lining of the heart.  It occurs more commonly in patients with diseased heart valves (such as patient's with aortic or mitral valve disease) and in patients who have undergone heart valve repair or replacement.  Certain surgical and dental procedures may put you at risk, such as dental cleaning, other dental procedures, or any surgery involving the respiratory, urinary, gastrointestinal tract, gallbladder or prostate gland.   To minimize your chances for develooping endocarditis, maintain good oral health and seek prompt medical attention for any infections involving the mouth, teeth, gums, skin or urinary tract.    Always notify your doctor or dentist about your underlying heart valve condition before having any invasive procedures. You will need to take antibiotics before certain procedures, including all routine dental cleanings or other dental procedures.  Your cardiologist or dentist should prescribe these antibiotics for you to be taken ahead of time.

## 2018-03-20 NOTE — Progress Notes (Signed)
[address]       [place] [address]             [phone]     CARDIOTHORACIC SURGERY OFFICE NOTE  Referring Provider is Branch, Dorothe Pea, MD PCP is Neita Carp, Clarene Critchley, MD   HPI:  Patient is a 42 year old obese female who returns to the office today for routine follow-up status post minimally invasive mitral valve replacement using a bileaflet mechanical prosthetic valve on December 22, 2017 for rheumatic heart disease with severe mitral regurgitation and mild mitral stenosis.  Her early postoperative recovery was uneventful and she was discharged home on the sixth postoperative day.  She was last seen here in our office on January 16, 2018 at which time she was making good progress with her recovery.  She returns to our office today and reports that overall she is doing reasonably well, although she states that she still has exertional shortness of breath and she was seen in the emergency department at [facility] on one occasion with an acute exacerbation of chronic diastolic congestive heart failure that was treated with IV Lasix.  She did not require hospital admission.  And since then her daily dose of oral Lasix has been increased to 80 mg daily.  She states that she no longer has any significant soreness in her chest although she occasionally still has sharp pains in the right posterior chest that seem to come and go sporadically.  Appetite is stable.  Most recent INR was slightly low at 2.2 and she states that her INR has been running low.  She has not had any dizzy spells or syncope.  She has no exertional chest pain.  She has enrolled in the outpatient cardiac rehab program but has not yet started.   Current Outpatient Medications  Medication Sig Dispense Refill  . acetaminophen (TYLENOL) 325 MG tablet Take 2 tablets (650 mg total) by mouth every 6 (six) hours as needed for mild pain.    Marland Kitchen albuterol (PROAIR HFA) 108 (90 Base) MCG/ACT inhaler Inhale 2 puffs into  the lungs every 4 (four) hours as needed for wheezing or shortness of breath.     . ALPRAZolam (XANAX) 0.5 MG tablet Take 0.5 mg by mouth 4 (four) times daily as needed for anxiety.     Marland Kitchen aspirin EC 81 MG tablet Take 1 tablet (81 mg total) by mouth daily. 90 tablet 3  . fluticasone (FLONASE) 50 MCG/ACT nasal spray Place 2 sprays into both nostrils daily as needed for allergies.     . furosemide (LASIX) 40 MG tablet Take 1.5 tablets (60 mg total) by mouth daily. 135 tablet 1  . metoprolol tartrate (LOPRESSOR) 25 MG tablet Take 1 tablet (25 mg total) by mouth 2 (two) times daily. 180 tablet 1  . potassium chloride SA (K-DUR,KLOR-CON) 20 MEQ tablet Take 1 tablet (20 mEq total) by mouth daily. 90 tablet 1  . sertraline (ZOLOFT) 100 MG tablet Take 200 mg by mouth daily.     . traZODone (DESYREL) 100 MG tablet Take 200 mg by mouth at bedtime.     Marland Kitchen warfarin (COUMADIN) 4 MG tablet Take 1 tablet (4 mg total) by mouth daily at 6 PM. 30 tablet 1   No current facility-administered medications for this visit.       Physical Exam:   BP 110/68 (BP Location: Left Arm, Patient Position: Sitting, Cuff Size: Normal)   Pulse 79   Resp 18  Ht 5\' 8"  (1.727 m)   Wt 235 lb 9.6 oz (106.9 kg)   SpO2 99% Comment: RA  BMI 35.82 kg/m   General:  Obese but well-appearing  Chest:   Clear to auscultation with symmetrical breath sounds  CV:   Regular rate and rhythm with mechanical heart valve sounds  Incisions:  Completely healed  Abdomen:  Soft nontender  Extremities:  Warm and well-perfused, no edema  Diagnostic Tests:  CHEST - 2 VIEW  COMPARISON:  01/16/2018, 02/18/2018  FINDINGS: Heart size is normal. Status post mitral valve repair. There is bibasilar atelectasis. No new consolidations or pleural effusions.  IMPRESSION: Minimal bibasilar atelectasis.   Electronically Signed   By: Norva Pavlov M.D.   On: 03/20/2018 12:00    Impression:  Patient appears to be doing well  approximately 3 months status post minimally invasive mitral valve replacement using a mechanical prosthetic valve for rheumatic heart disease with severe mitral regurgitation and mild mitral stenosis.  She still has significant exertional shortness of breath and requires daily Lasix for chronic diastolic congestive heart failure.  She has not had a follow-up echocardiogram since surgery.  Plan:  We will request a routine follow-up echocardiogram to make sure that her mechanical valve is functioning normally and there are no other concerning findings that might contribute to the patient's symptoms of chronic diastolic congestive heart failure.  I have encouraged the patient to continue to increase her physical activity without any particular limitations.  I stressed the importance of participating in the outpatient cardiac rehab program.  We discussed the goal range for her prothrombin time.  The patient has been reminded regarding the importance of dental hygiene and the lifelong need for antibiotic prophylaxis for all dental cleanings and other related invasive procedures.  The patient will continue to follow-up periodically with Dr. [name].  She will return to our office for routine follow-up next June, approximately 1 year following her surgery.     Salvatore Decent. Cornelius Moras, MD 03/20/2018 12:21 PM

## 2018-03-22 ENCOUNTER — Other Ambulatory Visit: Payer: Self-pay

## 2018-03-22 ENCOUNTER — Ambulatory Visit (HOSPITAL_COMMUNITY): Payer: Medicaid Other | Attending: Cardiology

## 2018-03-22 DIAGNOSIS — I071 Rheumatic tricuspid insufficiency: Secondary | ICD-10-CM | POA: Diagnosis not present

## 2018-03-22 DIAGNOSIS — Z952 Presence of prosthetic heart valve: Secondary | ICD-10-CM | POA: Diagnosis present

## 2018-03-22 DIAGNOSIS — R0600 Dyspnea, unspecified: Secondary | ICD-10-CM | POA: Diagnosis not present

## 2018-03-24 ENCOUNTER — Encounter (HOSPITAL_COMMUNITY): Payer: Medicaid Other

## 2018-03-27 ENCOUNTER — Telehealth: Payer: Self-pay | Admitting: Cardiology

## 2018-03-27 MED ORDER — WARFARIN SODIUM 2 MG PO TABS: ORAL_TABLET | ORAL | 3 refills | Status: DC

## 2018-03-27 NOTE — Telephone Encounter (Signed)
° ° ° °  1. Which medications need to be refilled? (please list name of each medication and dose if known) warfarin (COUMADIN) 4 MG tablet  2. Which pharmacy/location (including street and city if local pharmacy) is medication to be sent to?  [place]   3. Do they need a 30 day or 90 day supply?

## 2018-03-28 ENCOUNTER — Ambulatory Visit (INDEPENDENT_AMBULATORY_CARE_PROVIDER_SITE_OTHER): Payer: Medicaid Other | Admitting: *Deleted

## 2018-03-28 DIAGNOSIS — Z954 Presence of other heart-valve replacement: Secondary | ICD-10-CM

## 2018-03-28 DIAGNOSIS — Z5181 Encounter for therapeutic drug level monitoring: Secondary | ICD-10-CM

## 2018-03-28 LAB — POCT INR: INR: 2 (ref 2.0–3.0)

## 2018-03-28 NOTE — Patient Instructions (Signed)
Take coumadin 2 1/2 tablets tonight then increase dose to 2 tablets daily except  1 1/2 tablets on Sundays, Tuesdays and Thursdays Recheck in 2 weeks

## 2018-04-06 ENCOUNTER — Encounter (HOSPITAL_COMMUNITY): Payer: Medicaid Other

## 2018-04-11 ENCOUNTER — Ambulatory Visit (INDEPENDENT_AMBULATORY_CARE_PROVIDER_SITE_OTHER): Payer: Medicaid Other | Admitting: *Deleted

## 2018-04-11 DIAGNOSIS — Z954 Presence of other heart-valve replacement: Secondary | ICD-10-CM

## 2018-04-11 DIAGNOSIS — Z5181 Encounter for therapeutic drug level monitoring: Secondary | ICD-10-CM

## 2018-04-11 LAB — POCT INR: INR: 2.8 (ref 2.0–3.0)

## 2018-04-11 NOTE — Patient Instructions (Signed)
Continue coumadin 2 tablets daily except  1 1/2 tablets on Sundays, Tuesdays and Thursdays Recheck in 3 weeks

## 2018-05-02 ENCOUNTER — Ambulatory Visit (INDEPENDENT_AMBULATORY_CARE_PROVIDER_SITE_OTHER): Payer: Medicaid Other | Admitting: *Deleted

## 2018-05-02 DIAGNOSIS — Z954 Presence of other heart-valve replacement: Secondary | ICD-10-CM | POA: Diagnosis not present

## 2018-05-02 DIAGNOSIS — Z5181 Encounter for therapeutic drug level monitoring: Secondary | ICD-10-CM | POA: Diagnosis not present

## 2018-05-02 LAB — POCT INR: INR: 2.5 (ref 2.0–3.0)

## 2018-05-02 NOTE — Patient Instructions (Signed)
Increase coumadin to 2 tablets daily except 1 1/2 tablets on Sundays and Thursdays Recheck in 4 weeks

## 2018-05-16 DIAGNOSIS — Z736 Limitation of activities due to disability: Secondary | ICD-10-CM

## 2018-05-30 ENCOUNTER — Ambulatory Visit (INDEPENDENT_AMBULATORY_CARE_PROVIDER_SITE_OTHER): Payer: Medicaid Other | Admitting: *Deleted

## 2018-05-30 DIAGNOSIS — Z954 Presence of other heart-valve replacement: Secondary | ICD-10-CM

## 2018-05-30 DIAGNOSIS — Z5181 Encounter for therapeutic drug level monitoring: Secondary | ICD-10-CM

## 2018-05-30 LAB — POCT INR: INR: 2.2 (ref 2.0–3.0)

## 2018-05-30 NOTE — Patient Instructions (Signed)
Increase coumadin to 2 tablets daily except 3 tablets on Tuesdays Recheck in 2 weeks

## 2018-05-31 ENCOUNTER — Ambulatory Visit (INDEPENDENT_AMBULATORY_CARE_PROVIDER_SITE_OTHER): Payer: Medicaid Other | Admitting: Cardiology

## 2018-05-31 ENCOUNTER — Ambulatory Visit: Payer: Self-pay | Admitting: Cardiology

## 2018-05-31 ENCOUNTER — Encounter: Payer: Self-pay | Admitting: Cardiology

## 2018-05-31 VITALS — BP 118/74 | HR 82 | Ht 68.0 in | Wt 247.6 lb

## 2018-05-31 DIAGNOSIS — Z954 Presence of other heart-valve replacement: Secondary | ICD-10-CM | POA: Diagnosis not present

## 2018-05-31 DIAGNOSIS — I5033 Acute on chronic diastolic (congestive) heart failure: Secondary | ICD-10-CM

## 2018-05-31 MED ORDER — TORSEMIDE 20 MG PO TABS: 40.0000 mg | ORAL_TABLET | Freq: Two times a day (BID) | ORAL | 1 refills | Status: DC

## 2018-05-31 NOTE — Progress Notes (Signed)
Clinical Summary Gloria James is a 42 y.o.female  seen today for follow up of the following medical problems.     1. Rheumatic MV disease - - 08/2017 echo [facility]: rheumatic MV described, severe MS. Mean gradient 15. MVA by PHT 1.6. HR 84 during study. - 10/2017 TEE moderate MS, severe MR - referred for surgery - 12/22/17 had MVR with sorin carbomedical optiform bileaflet mechanical valve 31 mm - on coumadin and ASA  - 02/2018 echo LVEF 50-55%, normal MVR function - not able to do cardiac rehab due to cost.   2. Chronic diasotlic HF - still with some SOB at times. Has some LE edema at times, abdominal distension at times. 12 lbs weight since 02/2016, up 25 lbs since 12/2017. She denies any dietary changes.  - taking lasix  x 1 month after increasing on her own. She reports decreased urine output + othopnea x 3 pillows.      3. Pulmonary nodule - followed by pcp   Past Medical History:  Diagnosis Date  . Acute pulmonary edema (HCC)   . Anemia   . CHF (congestive heart failure) (HCC)   . Chronic diastolic (congestive) heart failure (HCC)   . Depression   . Dyspnea   . Dysrhythmia    Patient states she would have episodes for tachycardia in the past  . History of kidney stones   . Panic disorder without agoraphobia   . PONV (postoperative nausea and vomiting)   . Precordial pain   . Rheumatic mitral stenosis with regurgitation   . 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 Tricuspid regurgitation      No Known Allergies   Current Outpatient Medications  Medication Sig Dispense Refill  . acetaminophen (TYLENOL) 325 MG tablet Take 2 tablets (650 mg total) by mouth every 6 (six) hours as needed for mild pain.    Marland Kitchen albuterol (PROAIR HFA) 108 (90 Base) MCG/ACT inhaler Inhale 2 puffs into the lungs every 4 (four)  hours as needed for wheezing or shortness of breath.     . ALPRAZolam (XANAX) 0.5 MG tablet Take 0.5 mg by mouth 4 (four) times daily as needed for anxiety.     Marland Kitchen aspirin EC 81 MG tablet Take 1 tablet (81 mg total) by mouth daily. 90 tablet 3  . fluticasone (FLONASE) 50 MCG/ACT nasal spray Place 2 sprays into both nostrils daily as needed for allergies.     . furosemide (LASIX) 40 MG tablet Take 1.5 tablets (60 mg total) by mouth daily. 135 tablet 1  . metoprolol tartrate (LOPRESSOR) 25 MG tablet Take 1 tablet (25 mg total) by mouth 2 (two) times daily. 180 tablet 1  . potassium chloride SA (K-DUR,KLOR-CON) 20 MEQ tablet Take 1 tablet (20 mEq total) by mouth daily. 90 tablet 1  . sertraline (ZOLOFT) 100 MG tablet Take 200 mg by mouth daily.     . traZODone (DESYREL) 100 MG tablet Take 200 mg by mouth at bedtime.     Marland Kitchen warfarin (COUMADIN) 2 MG tablet Take 1 1/2 tablets daily except 2 tablets on Wednesdays and Saturdays 50 tablet 3   No current facility-administered medications for this visit.      Past Surgical History:  Procedure Laterality Date  . APPENDECTOMY     age 23  . CARDIAC CATHETERIZATION  11/03/2017  . MITRAL VALVE REPAIR  Right 12/22/2017   Procedure: MINIMALLY INVASIVE MITRAL VALVE REPLACEMENT(MVR);  Surgeon: Purcell Nails, MD;  Location: [facility] OR;  Service: Open Heart Surgery;  Laterality: Right;  . MULTIPLE EXTRACTIONS WITH ALVEOLOPLASTY N/A 12/06/2017   Procedure: Extraction of tooth #'s 4-6, 8-12, 20,22-29 and 32 with alveoloplasty;  Surgeon: Charlynne Pander, DDS;  Location: [facility] OR;  Service: Oral Surgery;  Laterality: N/A;  . MULTIPLE TOOTH EXTRACTIONS  2015  . OTHER SURGICAL HISTORY Left 2016   open surgery for kidney stones  . RIGHT AND LEFT HEART CATH N/A 11/03/2017   Procedure: RIGHT AND LEFT HEART CATH;  Surgeon: Kathleene Hazel, MD;  Location: [facility] INVASIVE CV LAB;  Service: Cardiovascular;  Laterality: N/A;  . TEE WITHOUT CARDIOVERSION N/A 11/03/2017    Procedure: TRANSESOPHAGEAL ECHOCARDIOGRAM (TEE);  Surgeon: Lewayne Bunting, MD;  Location: [facility] ENDOSCOPY;  Service: Cardiovascular;  Laterality: N/A;  . TEE WITHOUT CARDIOVERSION N/A 12/22/2017   Procedure: TRANSESOPHAGEAL ECHOCARDIOGRAM (TEE);  Surgeon: Purcell Nails, MD;  Location: [facility] OR;  Service: Open Heart Surgery;  Laterality: N/A;  . TONSILLECTOMY     6     No Known Allergies    Family History  Problem Relation Age of Onset  . Cancer Father   . Dementia Maternal Grandmother   . Heart disease Maternal Grandfather        had open heart problems  . Cancer Paternal Grandmother   . Cancer Paternal Grandfather      Social History Gloria James reports that she quit smoking about 8 months ago. Her smoking use included cigarettes. She started smoking about 26 years ago. She has a 13.00 pack-year smoking history. She has never used smokeless tobacco. Gloria James reports that she drank alcohol.   Review of Systems CONSTITUTIONAL: No weight loss, fever, chills, weakness or fatigue.  HEENT: Eyes: No visual loss, blurred vision, double vision or yellow sclerae.No hearing loss, sneezing, congestion, runny nose or sore throat.  SKIN: No rash or itching.  CARDIOVASCULAR: per hpi RESPIRATORY: per hpi GASTROINTESTINAL: No anorexia, nausea, vomiting or diarrhea. No abdominal pain or blood.  GENITOURINARY: No burning on urination, no polyuria NEUROLOGICAL: No headache, dizziness, syncope, paralysis, ataxia, numbness or tingling in the extremities. No change in bowel or bladder control.  MUSCULOSKELETAL: No muscle, back pain, joint pain or stiffness.  LYMPHATICS: No enlarged nodes. No history of splenectomy.  PSYCHIATRIC: No history of depression or anxiety.  ENDOCRINOLOGIC: No reports of sweating, cold or heat intolerance. No polyuria or polydipsia.  Marland Kitchen   Physical Examination Vitals:   05/31/18 0904  BP: 118/74  Pulse: 82  SpO2: 96%   Vitals:   05/31/18 0904  Weight: 247 lb  9.6 oz (112.3 kg)  Height: 5\' 8"  (1.727 m)    Gen: resting comfortably, no acute distress HEENT: no scleral icterus, pupils equal round and reactive, no palptable cervical adenopathy,  CV: RRR, mechaniacl S1, elevated JVD Resp: mild coarse breath sounds bilaterally GI: abdomen is soft, non-tender, non-distended, normal bowel sounds, no hepatosplenomegaly MSK: extremities are warm, trace bilateral edema Skin: warm, no rash Neuro:  no focal deficits Psych: appropriate affect   Diagnostic Studies  10/2017 echo Study Conclusions  - Left ventricle: Systolic function was normal. The estimated ejection fraction was in the range of 55% to 60%. Wall motion was normal; there were no regional wall motion abnormalities. - Aortic valve: There was trivial regurgitation. - Mitral valve: Mild thickening, consistent with rheumatic disease. Mobility of the posterior leaflet was severely restricted.  The findings are consistent with moderate stenosis. There was severe regurgitation. Valve area by pressure half-time: 1.76 cm^2. - Left atrium: The atrium was severely dilated. No evidence of thrombus in the atrial cavity or appendage. - Atrial septum: No defect or patent foramen ovale was identified. - Tricuspid valve: No evidence of vegetation. There was mild-moderate regurgitation. - Pulmonic valve: No evidence of vegetation.  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   10/2017 cath 1. NO angiographic evidence of CAD 2. Large v-wave on wedge tracing c/w severe MR  Recommendations: Continue workup for surgical MV replacement/repair. I do not think  she will be a candidate for balloon valvuloplasty given her severe MR.     Assessment and Plan  1. Mitral valve replacement/Mitral stenosis - overall doing well since surgery, repeat echo shows normal functioning MVR - continue current meds  2. Acute on chronic diastolic HF - symptoms suggesting CHF, 25 lbs weight gain since July. Exam is limited due to body habitus but suggests fluid overload - not responding to lasix 80mg  daily. Change to torsemide 40mg  daily, update [place] on home weights on Monday, BMET/Mg/TSH in 2 weeks.   F/u 6 weeks.       Antoine Poche, M.D.

## 2018-05-31 NOTE — Patient Instructions (Signed)
Your physician recommends that you schedule a follow-up appointment in: 6 WEEKS WITH DR [facility]  Your physician has recommended you make the following change in your medication:   STOP LASIX  START TORSEMIDE 40 MG (2 TABLETS) DAILY   Your physician recommends that you return for lab work in: 2 WEEKS - BMP/MG/TSH  PLEASE UPDATE [place] ON YOUR WEIGHTS ON Monday  Thank you for choosing [facility]!!

## 2018-06-01 ENCOUNTER — Encounter: Payer: Self-pay | Admitting: *Deleted

## 2018-06-05 ENCOUNTER — Telehealth: Payer: Self-pay | Admitting: *Deleted

## 2018-06-05 NOTE — Telephone Encounter (Signed)
Patient is calling about her medications and her weight gain.   Please call [phone].

## 2018-06-05 NOTE — Telephone Encounter (Signed)
Called pt in regards to MyChart message for clarification - pt is currently taking torsemide 40 mg bid (80 mg daily) - will have labs done in the morning at [facility] as per Dr [name] and will have him review lab results for further medication adjustments

## 2018-06-06 ENCOUNTER — Telehealth: Payer: Self-pay | Admitting: *Deleted

## 2018-06-06 ENCOUNTER — Encounter: Payer: Self-pay | Admitting: *Deleted

## 2018-06-06 NOTE — Telephone Encounter (Signed)
-----   Message from Antoine Poche, MD sent at 06/06/2018  3:49 PM EST ----- Labs look fine, she is tolerating the fluid pill. If taking 40mg  bid then can increase to 60mg  bid. Have her update [place] Friday on her weights and symptoms   Dominga Ferry MD

## 2018-06-06 NOTE — Telephone Encounter (Signed)
Requested lab results.

## 2018-06-06 NOTE — Telephone Encounter (Signed)
Can we contact her and let her know we have requested labs and will be in touch with her once reviewed   J Tyeasha Ebbs MD

## 2018-06-06 NOTE — Telephone Encounter (Signed)
Pt aware and voiced understanding - will update [place] on Friday - routed to pcp

## 2018-06-06 NOTE — Telephone Encounter (Signed)
Pt aware - see 12/10 result note

## 2018-06-06 NOTE — Telephone Encounter (Signed)
-----   Message from Jonathan F Branch, MD sent at 06/06/2018  3:49 PM EST ----- Labs look fine, she is tolerating the fluid pill. If taking 40mg bid then can increase to 60mg bid. Have her update us Friday on her weights and symptoms   J Branch MD 

## 2018-06-29 ENCOUNTER — Ambulatory Visit (INDEPENDENT_AMBULATORY_CARE_PROVIDER_SITE_OTHER): Payer: Medicaid Other | Admitting: Pharmacist

## 2018-06-29 DIAGNOSIS — Z954 Presence of other heart-valve replacement: Secondary | ICD-10-CM

## 2018-06-29 DIAGNOSIS — Z5181 Encounter for therapeutic drug level monitoring: Secondary | ICD-10-CM

## 2018-06-29 LAB — POCT INR: INR: 5.1 — AB (ref 2.0–3.0)

## 2018-06-29 NOTE — Patient Instructions (Signed)
Description   No warfarin today or tomorrow then change dose to coumadin to 2 tablets daily Recheck in 10 days

## 2018-07-11 ENCOUNTER — Ambulatory Visit (INDEPENDENT_AMBULATORY_CARE_PROVIDER_SITE_OTHER): Payer: Medicaid Other | Admitting: Pharmacist

## 2018-07-11 DIAGNOSIS — Z5181 Encounter for therapeutic drug level monitoring: Secondary | ICD-10-CM

## 2018-07-11 DIAGNOSIS — Z954 Presence of other heart-valve replacement: Secondary | ICD-10-CM

## 2018-07-11 LAB — POCT INR: INR: 3.3 — AB (ref 2.0–3.0)

## 2018-07-11 NOTE — Patient Instructions (Signed)
Description   Continue coumadin 2 tablets daily Recheck in 3 weeks

## 2018-07-13 ENCOUNTER — Ambulatory Visit (INDEPENDENT_AMBULATORY_CARE_PROVIDER_SITE_OTHER): Payer: Medicaid Other | Admitting: Cardiology

## 2018-07-13 ENCOUNTER — Telehealth: Payer: Self-pay | Admitting: Cardiology

## 2018-07-13 ENCOUNTER — Encounter: Payer: Self-pay | Admitting: Cardiology

## 2018-07-13 VITALS — BP 105/70 | HR 81 | Ht 68.0 in | Wt 248.6 lb

## 2018-07-13 DIAGNOSIS — Z954 Presence of other heart-valve replacement: Secondary | ICD-10-CM

## 2018-07-13 DIAGNOSIS — I052 Rheumatic mitral stenosis with insufficiency: Secondary | ICD-10-CM

## 2018-07-13 DIAGNOSIS — I5033 Acute on chronic diastolic (congestive) heart failure: Secondary | ICD-10-CM | POA: Diagnosis not present

## 2018-07-13 DIAGNOSIS — R0602 Shortness of breath: Secondary | ICD-10-CM

## 2018-07-13 MED ORDER — TORSEMIDE 20 MG PO TABS: 60.0000 mg | ORAL_TABLET | Freq: Two times a day (BID) | ORAL | 3 refills | Status: DC

## 2018-07-13 NOTE — Patient Instructions (Signed)
Your physician recommends that you schedule a follow-up appointment in: 2 MONTHS WITH DR [facility]  Your physician recommends that you continue on your current medications as directed. Please refer to the Current Medication list given to you today.  REFILLS FOR TORSEMIDE 60 MG TWICE DAILY SENT TO PHARMACY  Your physician recommends that you return for lab work BMP/BNP/MG  Your physician has requested that you have an echocardiogram. Echocardiography is a painless test that uses sound waves to create images of your heart. It provides your doctor with information about the size and shape of your heart and how well your heart's chambers and valves are working. This procedure takes approximately one hour. There are no restrictions for this procedure.  Thank you for choosing [facility]!!

## 2018-07-13 NOTE — Progress Notes (Signed)
Clinical Summary Ms. Gloria James is a 43 y.o.female seen today for follow up of the following medical problems.     1. Rheumatic MV disease -- 08[facility]: rheumatic MV described, severe MS. Mean gradient 15. MVA by PHT 1.6. HR 84 during study. - 10/2017 TEE moderate MS, severe MR - referred for surgery - 12/22/17 had MVR with sorin carbomedical optiform bileaflet mechanical valve 31 mm - on coumadin and ASA  - 02/2018 echo LVEF 50-55%, normal MVR function - not able to do cardiac rehab due to cost.   2. Acute on chronic diasotlic HF - still with some SOB at times. Has some LE edema at times, abdominal distension at times. 12 lbs weight since 02/2018, up 25 lbs since 12/2017. She denies any dietary changes.  - taking lasix 80mg  x 1 month after increasing on her own. She reports decreased urine output + othopnea x 3 pillows.    - last visit 25 lbs weight gain since July, some evidence of fluid overload. Changed her to torsemide 40mg  daily per notes, however looks like she actually started taking toresmide 40mg  bid. Later increased to 60mg  bid   home weights 242-251 lbs - torsemide 60mg  in AM and 60mg  in PM. Abdominal distension, some LE edema which comes and goes. Ongoign SOB and orthopnea - limiting sodium intake.  - chronic orthopnea unchanged  3. Pulmonary nodule - followed by pcp  Past Medical History:  Diagnosis Date  . Acute pulmonary edema (HCC)   . Anemia   . CHF (congestive heart failure) (HCC)   . Chronic diastolic (congestive) heart failure (HCC)   . Depression   . Dyspnea   . Dysrhythmia    Patient states she would have episodes for tachycardia in the past  . History of kidney stones   . Panic disorder without agoraphobia   . PONV (postoperative nausea and vomiting)   . Precordial pain   . Rheumatic mitral stenosis with regurgitation   . 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 Tricuspid regurgitation      No Known Allergies   Current Outpatient Medications  Medication Sig Dispense Refill  . acetaminophen (TYLENOL) 325 MG tablet Take 2 tablets (650 mg total) by mouth every 6 (six) hours as needed for mild pain.    Marland Kitchen. albuterol (PROAIR HFA) 108 (90 Base) MCG/ACT inhaler Inhale 2 puffs into the lungs every 4 (four) hours as needed for wheezing or shortness of breath.     . ALPRAZolam (XANAX) 0.5 MG tablet Take 0.5 mg by mouth 4 (four) times daily as needed for anxiety.     Marland Kitchen. aspirin EC 81 MG tablet Take 1 tablet (81 mg total) by mouth daily. 90 tablet 3  . fluticasone (FLONASE) 50 MCG/ACT nasal spray Place 2 sprays into both nostrils daily as needed for allergies.     . metoprolol tartrate (LOPRESSOR) 25 MG tablet Take 1 tablet (25 mg total) by mouth 2 (two) times daily. 180 tablet 1  . potassium chloride SA (K-DUR,KLOR-CON) 20 MEQ tablet Take 1 tablet (20 mEq total) by mouth daily. 90 tablet 1  . sertraline (ZOLOFT) 100 MG tablet Take 200 mg by mouth daily.     Marland Kitchen. torsemide (DEMADEX) 20 MG tablet Take 2 tablets (40 mg total) by mouth 2 (two) times daily. 180 tablet 1  . traZODone (DESYREL) 100  MG tablet Take 200 mg by mouth at bedtime.     Marland Kitchen warfarin (COUMADIN) 2 MG tablet Take 1 1/2 tablets daily except 2 tablets on Wednesdays and Saturdays 50 tablet 3   No current facility-administered medications for this visit.      Past Surgical History:  Procedure Laterality Date  . APPENDECTOMY     age 61  . CARDIAC CATHETERIZATION  11/03/2017  . MITRAL VALVE REPAIR Right 12/22/2017   Procedure: MINIMALLY INVASIVE MITRAL VALVE REPLACEMENT(MVR);  Surgeon: Purcell Nails, MD;  Location: [facility] OR;  Service: Open Heart Surgery;  Laterality: Right;  . MULTIPLE EXTRACTIONS WITH ALVEOLOPLASTY N/A 12/06/2017   Procedure: Extraction of tooth #'s 4-6, 8-12, 20,22-29 and 32 with alveoloplasty;   Surgeon: Charlynne Pander, DDS;  Location: [facility] OR;  Service: Oral Surgery;  Laterality: N/A;  . MULTIPLE TOOTH EXTRACTIONS  2015  . OTHER SURGICAL HISTORY Left 2016   open surgery for kidney stones  . RIGHT AND LEFT HEART CATH N/A 11/03/2017   Procedure: RIGHT AND LEFT HEART CATH;  Surgeon: Kathleene Hazel, MD;  Location: [facility] INVASIVE CV LAB;  Service: Cardiovascular;  Laterality: N/A;  . TEE WITHOUT CARDIOVERSION N/A 11/03/2017   Procedure: TRANSESOPHAGEAL ECHOCARDIOGRAM (TEE);  Surgeon: Lewayne Bunting, MD;  Location: [facility] ENDOSCOPY;  Service: Cardiovascular;  Laterality: N/A;  . TEE WITHOUT CARDIOVERSION N/A 12/22/2017   Procedure: TRANSESOPHAGEAL ECHOCARDIOGRAM (TEE);  Surgeon: Purcell Nails, MD;  Location: [facility] OR;  Service: Open Heart Surgery;  Laterality: N/A;  . TONSILLECTOMY     6     No Known Allergies    Family History  Problem Relation Age of Onset  . Cancer Father   . Dementia Maternal Grandmother   . Heart disease Maternal Grandfather        had open heart problems  . Cancer Paternal Grandmother   . Cancer Paternal Grandfather      Social History Gloria James reports that she quit smoking about 9 months ago. Her smoking use included cigarettes. She started smoking about 26 years ago. She has a 13.00 pack-year smoking history. She has never used smokeless tobacco. Gloria James reports previous alcohol use.   Review of Systems CONSTITUTIONAL: No weight loss, fever, chills, weakness or fatigue.  HEENT: Eyes: No visual loss, blurred vision, double vision or yellow sclerae.No hearing loss, sneezing, congestion, runny nose or sore throat.  SKIN: No rash or itching.  CARDIOVASCULAR: per hpi RESPIRATORY: per hpi GASTROINTESTINAL: No anorexia, nausea, vomiting or diarrhea. No abdominal pain or blood.  GENITOURINARY: No burning on urination, no polyuria NEUROLOGICAL: No headache, dizziness, syncope, paralysis, ataxia, numbness or tingling in the extremities. No change  in bowel or bladder control.  MUSCULOSKELETAL: No muscle, back pain, joint pain or stiffness.  LYMPHATICS: No enlarged nodes. No history of splenectomy.  PSYCHIATRIC: No history of depression or anxiety.  ENDOCRINOLOGIC: No reports of sweating, cold or heat intolerance. No polyuria or polydipsia.  Marland Kitchen   Physical Examination Vitals:   07/13/18 1031  BP: 105/70  Pulse: 81  SpO2: 98%   Vitals:   07/13/18 1031  Weight: 248 lb 9.6 oz (112.8 kg)  Height: 5\' 8"  (1.727 m)    Gen: resting comfortably, no acute distress HEENT: no scleral icterus, pupils equal round and reactive, no palptable cervical adenopathy,  CV: RRR, mechanical S1 Resp: Clear to auscultation bilaterally GI: abdomen is soft, non-tender, non-distended, normal bowel sounds, no hepatosplenomegaly MSK: extremities are warm, no edema.  Skin: warm, no rash Neuro:  no focal deficits Psych: appropriate affect   Diagnostic Studies 10/2017 echo Study Conclusions  - Left ventricle: Systolic function was normal. The estimated ejection fraction was in the range of 55% to 60%. Wall motion was normal; there were no regional wall motion abnormalities. - Aortic valve: There was trivial regurgitation. - Mitral valve: Mild thickening, consistent with rheumatic disease. Mobility of the posterior leaflet was severely restricted. The findings are consistent with moderate stenosis. There was severe regurgitation. Valve area by pressure half-time: 1.76 cm^2. - Left atrium: The atrium was severely dilated. No evidence of thrombus in the atrial cavity or appendage. - Atrial septum: No defect or patent foramen ovale was identified. - Tricuspid valve: No evidence of vegetation. There was mild-moderate regurgitation. - Pulmonic valve: No evidence of vegetation.  Impressions:  - Normal LV function; sclerotic aortic valve with trace AI and small oscillating density noted (possible small fibroelastoma vs lambls  excrescence); rheumatic MV with severely restricted posterior leaflet; moderate MS (mean gradient 15 mmHg partially explained by MR; MVA 1.76 cm2 by pressure halftime); severe MR; severe LAE; mild to moderate TR.   10/2017 cath 1. NO angiographic evidence of CAD 2. Large v-wave on wedge tracing c/w severe MR  Recommendations: Continue workup for surgical MV replacement/repair. I do not think she will be a candidate for balloon valvuloplasty given her severe MR.   02/2018 echo Study Conclusions  - Left ventricle: The cavity size was normal. Systolic function was   normal. The estimated ejection fraction was in the range of 50%   to 55%. Wall motion was normal; there were no regional wall   motion abnormalities. Features are consistent with a pseudonormal   left ventricular filling pattern, with concomitant abnormal   relaxation and increased filling pressure (grade 2 diastolic   dysfunction). Doppler parameters are consistent with high   ventricular filling pressure. - Mitral valve: A mechanical prosthesis was present and functioning   normally. Mean gradient (D): 4 mm Hg. Valve area by pressure   half-time: 2.93 cm^2. Valve area by continuity equation (using   LVOT flow): 2.1 cm^2. - Left atrium: The atrium was mildly dilated. - Tricuspid valve: There was mild regurgitation. - Pulmonic valve: There was trivial regurgitation.  Impressions:  - Low normal LVF with EF 50-55%, mild TR, stable mechanical MVR   well seated with no perivalvular MR. The mean MVG is stable at   4mmHg and MVA 2.93cm2 by PHT and 2.1cm2 by continuity equation.   Since last echo, mechanical MVR is new.     Assessment and Plan  1. Mitral valve replacement/Mitral stenosis - with recent weight gain and SOB repeat echo.   2. Acute on chronic diastolic HF - weight up 25 lbs over the last few months with some increased SOB. Exam is difficult due to body habitus to evaluate fluid status. We  have tried uptitrating her diuretics now on torsemide 60mg  bid, however weights unchanged, symptoms unchanged - repeat echo - check labs, if stable kidney function increase torsemide to 80mg  bid. If signs of renal insufficiency on current diuretic would think weight gain is not fluid related, though she denies any change in diet has had some increased inactivity since her surgery.     F/u 2 months      Antoine PocheJonathan F. Katrina Brosh, M.D.

## 2018-07-13 NOTE — Telephone Encounter (Signed)
°  Precert needed for: Echo   Location: [facility]    Date:  Jul 27, 2018

## 2018-07-14 ENCOUNTER — Other Ambulatory Visit: Payer: Self-pay | Admitting: Cardiology

## 2018-07-14 DIAGNOSIS — Z5181 Encounter for therapeutic drug level monitoring: Secondary | ICD-10-CM

## 2018-07-14 MED ORDER — WARFARIN SODIUM 2 MG PO TABS: ORAL_TABLET | ORAL | 2 refills | Status: DC

## 2018-07-14 NOTE — Telephone Encounter (Signed)
Needing refill for warfarin (COUMADIN) 2 MG tablet [[id number]]  Sent to [facility]   Please message pt on mychart when this is complete.

## 2018-07-14 NOTE — Telephone Encounter (Signed)
Rx sent to [facility] pharmacy in [place]

## 2018-07-21 ENCOUNTER — Telehealth: Payer: Self-pay | Admitting: *Deleted

## 2018-07-21 DIAGNOSIS — Z79899 Other long term (current) drug therapy: Secondary | ICD-10-CM

## 2018-07-21 DIAGNOSIS — I5032 Chronic diastolic (congestive) heart failure: Secondary | ICD-10-CM

## 2018-07-21 MED ORDER — POTASSIUM CHLORIDE CRYS ER 20 MEQ PO TBCR: 40.0000 meq | EXTENDED_RELEASE_TABLET | Freq: Every day | ORAL | 1 refills | Status: DC

## 2018-07-21 MED ORDER — TORSEMIDE 20 MG PO TABS: 80.0000 mg | ORAL_TABLET | Freq: Two times a day (BID) | ORAL | 3 refills | Status: DC

## 2018-07-21 NOTE — Telephone Encounter (Signed)
-----   Message from Antoine Poche, MD sent at 07/21/2018 10:47 AM EST ----- Labs show normal kidney function, she is tolerating the fluid pills. Potassium is a little low, please verify taking daily of KCl, if so increase KCl to daily.  One of her blood tests does suggest she is retaining some fluid. Verify she is taking torsemide 60mg  bid, then Increase toresemide to 80mg  bid, recehck BMET/Mg in 2 weeks.   Dina Rich MD

## 2018-07-21 NOTE — Telephone Encounter (Signed)
Patient informed and confirmed doses of K+ 20 meq daily and torsemide 60 mg BID. Patient agrees to increase of both doses as recommended by Dr. [name] and aware to have lab work done in 2 weeks. Rx sent to [facility]. Lab orders faxed to [facility] per patient request.

## 2018-07-27 ENCOUNTER — Ambulatory Visit (INDEPENDENT_AMBULATORY_CARE_PROVIDER_SITE_OTHER): Payer: Medicaid Other

## 2018-07-27 DIAGNOSIS — R0602 Shortness of breath: Secondary | ICD-10-CM

## 2018-08-02 ENCOUNTER — Telehealth: Payer: Self-pay | Admitting: *Deleted

## 2018-08-02 NOTE — Telephone Encounter (Signed)
-----   Message from Antoine Poche, MD sent at 07/31/2018  9:48 AM EST ----- Echo overall looks good. Heart function remains strong, her valve looks good  Dominga Ferry MD

## 2018-08-02 NOTE — Telephone Encounter (Signed)
Pt voiced understanding - routed to pcp  

## 2018-08-10 ENCOUNTER — Ambulatory Visit (INDEPENDENT_AMBULATORY_CARE_PROVIDER_SITE_OTHER): Payer: Medicaid Other | Admitting: Pharmacist

## 2018-08-10 DIAGNOSIS — Z5181 Encounter for therapeutic drug level monitoring: Secondary | ICD-10-CM

## 2018-08-10 DIAGNOSIS — Z954 Presence of other heart-valve replacement: Secondary | ICD-10-CM | POA: Diagnosis not present

## 2018-08-10 LAB — POCT INR: INR: 5 — AB (ref 2.0–3.0)

## 2018-08-10 NOTE — Patient Instructions (Signed)
Description   No coumadin today then change dose to coumadin 2 tablets daily, except 1 tablet on Fridays.  Recheck in 2 weeks

## 2018-08-18 ENCOUNTER — Telehealth: Payer: Self-pay | Admitting: *Deleted

## 2018-08-18 NOTE — Telephone Encounter (Signed)
-----   Message from Antoine Poche, MD sent at 08/17/2018  6:50 PM EST ----- Labs look good  Dominga Ferry MD

## 2018-08-18 NOTE — Telephone Encounter (Signed)
Patient informed. Copy sent to PCP °

## 2018-08-31 ENCOUNTER — Ambulatory Visit (INDEPENDENT_AMBULATORY_CARE_PROVIDER_SITE_OTHER): Payer: Medicaid Other | Admitting: *Deleted

## 2018-08-31 DIAGNOSIS — Z954 Presence of other heart-valve replacement: Secondary | ICD-10-CM | POA: Diagnosis not present

## 2018-08-31 DIAGNOSIS — Z5181 Encounter for therapeutic drug level monitoring: Secondary | ICD-10-CM

## 2018-08-31 LAB — POCT INR: INR: 2.5 (ref 2.0–3.0)

## 2018-08-31 NOTE — Patient Instructions (Signed)
Only decreased coumadin the first Friday Restart coumadin 2 tablets daily Recheck in 2 weeks

## 2018-09-12 ENCOUNTER — Ambulatory Visit (INDEPENDENT_AMBULATORY_CARE_PROVIDER_SITE_OTHER): Payer: Medicaid Other | Admitting: Cardiology

## 2018-09-12 ENCOUNTER — Encounter: Payer: Self-pay | Admitting: Cardiology

## 2018-09-12 ENCOUNTER — Other Ambulatory Visit: Payer: Self-pay

## 2018-09-12 ENCOUNTER — Ambulatory Visit (INDEPENDENT_AMBULATORY_CARE_PROVIDER_SITE_OTHER): Payer: Medicaid Other | Admitting: *Deleted

## 2018-09-12 VITALS — BP 108/71 | HR 78 | Ht 68.0 in | Wt 250.6 lb

## 2018-09-12 DIAGNOSIS — Z952 Presence of prosthetic heart valve: Secondary | ICD-10-CM

## 2018-09-12 DIAGNOSIS — I5032 Chronic diastolic (congestive) heart failure: Secondary | ICD-10-CM

## 2018-09-12 DIAGNOSIS — Z954 Presence of other heart-valve replacement: Secondary | ICD-10-CM | POA: Diagnosis not present

## 2018-09-12 DIAGNOSIS — Z5181 Encounter for therapeutic drug level monitoring: Secondary | ICD-10-CM

## 2018-09-12 LAB — POCT INR: INR: 4.4 — AB (ref 2.0–3.0)

## 2018-09-12 NOTE — Progress Notes (Signed)
Clinical Summary Ms. Guardiola is a 43 y.o.female  seen today for follow up of the following medical problems.     1. Rheumatic MV disease -- 08/2017 echo [facility]: rheumatic MV described, severe MS. Mean gradient 15. MVA by PHT 1.6. HR 84 during study. - 10/2017 TEE moderate MS, severe MR - referred for surgery - 12/22/17 had MVR with sorin carbomedical optiform bileaflet mechanical valve 31 mm - on coumadinand ASA  -02/2018 echo LVEF 50-55%, normal MVR function - not able to do cardiac rehab due to cost.    -Jan 2020 echo LVEF 50-55%, cannot eval diastolic function with valve repair, trivial MR, mean MV gradient 4 mmHg.   2. Acute on chronic diasotlic HF -still with some SOB at times. Has some LE edema at times, abdominal distension at times. 12 lbs weightsince 02/2018, up 25 lbs since 12/2017. She denies any dietary changes.  - 25 lbs weight gain since July, some evidence of fluid overload. We have been titrating her diuretics over the last several visits without much benefit, most recently up to  bid of torsemide.  - weights remain stable around 250 lbs.Breathing mildly improved. Occasional abdominal distension. No LE edema. Chronic orthopnea. No orthostatic symptoms.   3. Pulmonary nodule - followed by pcp   Past Medical History:  Diagnosis Date  . Acute pulmonary edema (HCC)   . Anemia   . CHF (congestive heart failure) (HCC)   . Chronic diastolic (congestive) heart failure (HCC)   . Depression   . Dyspnea   . Dysrhythmia    Patient states she would have episodes for tachycardia in the past  . History of kidney stones   . Panic disorder without agoraphobia   . PONV (postoperative nausea and vomiting)   . Precordial pain   . Rheumatic mitral stenosis with regurgitation   . 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 Tricuspid regurgitation      No Known Allergies   Current Outpatient Medications  Medication Sig Dispense Refill  . acetaminophen (TYLENOL) 325 MG tablet Take 2 tablets (650 mg total) by mouth every 6 (six) hours as needed for mild pain.    Marland Kitchen albuterol (PROAIR HFA) 108 (90 Base) MCG/ACT inhaler Inhale 2 puffs into the lungs every 4 (four) hours as needed for wheezing or shortness of breath.     . ALPRAZolam (XANAX) 0.5 MG tablet Take 0.5 mg by mouth 4 (four) times daily as needed for anxiety.     Marland Kitchen aspirin EC 81 MG tablet Take 1 tablet (81 mg total) by mouth daily. 90 tablet 3  . fluticasone (FLONASE) 50 MCG/ACT nasal spray Place 2 sprays into both nostrils daily as needed for allergies.     . metoprolol tartrate (LOPRESSOR) 25 MG tablet Take 1 tablet (25 mg total) by mouth 2 (two) times daily. 180 tablet 1  . potassium chloride SA (K-DUR,KLOR-CON) 20 MEQ tablet Take 2 tablets (40 mEq total) by mouth daily. 180 tablet 1  . sertraline (ZOLOFT) 100 MG tablet Take 200 mg by mouth daily.     Marland Kitchen torsemide (DEMADEX) 20 MG tablet Take 4 tablets (80 mg total) by mouth 2 (two) times daily. 240 tablet 3  . traZODone (DESYREL) 100 MG tablet Take 200 mg by mouth at bedtime.     Marland Kitchen warfarin (COUMADIN) 2 MG tablet Take 2 tablets daily  or AS DIRECTED BY COUMADIN CLINIC 65 tablet 2   No current facility-administered medications for this visit.      Past Surgical History:  Procedure Laterality Date  . APPENDECTOMY     age 40  . CARDIAC CATHETERIZATION  11/03/2017  . MITRAL VALVE REPAIR Right 12/22/2017   Procedure: MINIMALLY INVASIVE MITRAL VALVE REPLACEMENT(MVR);  Surgeon: Purcell Nails, MD;  Location: [facility] OR;  Service: Open Heart Surgery;  Laterality: Right;  . MULTIPLE EXTRACTIONS WITH ALVEOLOPLASTY N/A 12/06/2017   Procedure: Extraction of tooth #'s 4-6, 8-12, 20,22-29 and 32 with alveoloplasty;  Surgeon: Charlynne Pander, DDS;  Location: [facility] OR;  Service: Oral Surgery;  Laterality:  N/A;  . MULTIPLE TOOTH EXTRACTIONS  2015  . OTHER SURGICAL HISTORY Left 2016   open surgery for kidney stones  . RIGHT AND LEFT HEART CATH N/A 11/03/2017   Procedure: RIGHT AND LEFT HEART CATH;  Surgeon: Kathleene Hazel, MD;  Location: [facility] INVASIVE CV LAB;  Service: Cardiovascular;  Laterality: N/A;  . TEE WITHOUT CARDIOVERSION N/A 11/03/2017   Procedure: TRANSESOPHAGEAL ECHOCARDIOGRAM (TEE);  Surgeon: Lewayne Bunting, MD;  Location: [facility] ENDOSCOPY;  Service: Cardiovascular;  Laterality: N/A;  . TEE WITHOUT CARDIOVERSION N/A 12/22/2017   Procedure: TRANSESOPHAGEAL ECHOCARDIOGRAM (TEE);  Surgeon: Purcell Nails, MD;  Location: [facility] OR;  Service: Open Heart Surgery;  Laterality: N/A;  . TONSILLECTOMY     6     No Known Allergies    Family History  Problem Relation Age of Onset  . Cancer Father   . Dementia Maternal Grandmother   . Heart disease Maternal Grandfather        had open heart problems  . Cancer Paternal Grandmother   . Cancer Paternal Grandfather      Social History Ms. Wallgren reports that she quit smoking about a year ago. Her smoking use included cigarettes. She started smoking about 26 years ago. She has a 13.00 pack-year smoking history. She has never used smokeless tobacco. Ms. Maietta reports previous alcohol use.   Review of Systems CONSTITUTIONAL: No weight loss, fever, chills, weakness or fatigue.  HEENT: Eyes: No visual loss, blurred vision, double vision or yellow sclerae.No hearing loss, sneezing, congestion, runny nose or sore throat.  SKIN: No rash or itching.  CARDIOVASCULAR: per hpi RESPIRATORY: No shortness of breath, cough or sputum.  GASTROINTESTINAL: No anorexia, nausea, vomiting or diarrhea. No abdominal pain or blood.  GENITOURINARY: No burning on urination, no polyuria NEUROLOGICAL: No headache, dizziness, syncope, paralysis, ataxia, numbness or tingling in the extremities. No change in bowel or bladder control.  MUSCULOSKELETAL: No  muscle, back pain, joint pain or stiffness.  LYMPHATICS: No enlarged nodes. No history of splenectomy.  PSYCHIATRIC: No history of depression or anxiety.  ENDOCRINOLOGIC: No reports of sweating, cold or heat intolerance. No polyuria or polydipsia.  Marland Kitchen   Physical Examination Today's Vitals   09/12/18 0842  BP: 108/71  Pulse: 78  SpO2: 100%  Weight: 250 lb 9.6 oz (113.7 kg)  Height: 5\' 8"  (1.727 m)   Body mass index is 38.1 kg/m.  Gen: resting comfortably, no acute distress HEENT: no scleral icterus, pupils equal round and reactive, no palptable cervical adenopathy,  CV: RRR, no m/rg, no jvd Resp: Clear to auscultation bilaterally GI: abdomen is soft, non-tender, non-distended, normal bowel sounds, no hepatosplenomegaly MSK: extremities are warm, no edema.  Skin: warm, no rash Neuro:  no focal deficits Psych: appropriate affect   Diagnostic Studies 10/2017 echo Study Conclusions  -  Left ventricle: Systolic function was normal. The estimated ejection fraction was in the range of 55% to 60%. Wall motion was normal; there were no regional wall motion abnormalities. - Aortic valve: There was trivial regurgitation. - Mitral valve: Mild thickening, consistent with rheumatic disease. Mobility of the posterior leaflet was severely restricted. The findings are consistent with moderate stenosis. There was severe regurgitation. Valve area by pressure half-time: 1.76 cm^2. - Left atrium: The atrium was severely dilated. No evidence of thrombus in the atrial cavity or appendage. - Atrial septum: No defect or patent foramen ovale was identified. - Tricuspid valve: No evidence of vegetation. There was mild-moderate regurgitation. - Pulmonic valve: No evidence of vegetation.  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   10/2017 cath 1. NO angiographic evidence of CAD 2. Large v-wave on wedge tracing c/w severe MR  Recommendations: Continue workup for surgical MV replacement/repair. I do not think she will be a candidate for balloon valvuloplasty given her severe MR.   02/2018 echo Study Conclusions  - Left ventricle: The cavity size was normal. Systolic function was normal. The estimated ejection fraction was in the range of 50% to 55%. Wall motion was normal; there were no regional wall motion abnormalities. Features are consistent with a pseudonormal left ventricular filling pattern, with concomitant abnormal relaxation and increased filling pressure (grade 2 diastolic dysfunction). Doppler parameters are consistent with high ventricular filling pressure. - Mitral valve: A mechanical prosthesis was present and functioning normally. Mean gradient (D): 4 mm Hg. Valve area by pressure half-time: 2.93 cm^2. Valve area by continuity equation (using LVOT flow): 2.1 cm^2. - Left atrium: The atrium was mildly dilated. - Tricuspid valve: There was mild regurgitation. - Pulmonic valve: There was trivial regurgitation.  Impressions:  - Low normal LVF with EF 50-55%, mild TR, stable mechanical MVR well seated with no perivalvular MR. The mean MVG is stable at and MVA 2.93cm2 by PHT and 2.1cm2 by continuity equation. Since last echo, mechanical MVR is new.   Jan 2020 echo IMPRESSIONS    1. The left ventricle has The cavity size is normal. There is no left ventricular wall thickness. Indeterminate diastolic filling patterns. The left ventricular diastology could not be evaluated due to mitral valve replacement/repair.  2. A 31 Sorin Carbomedics mechanical valve is present in the  mitral position.  3. Normal tricuspid valve.  4. The aortic valve tricuspid. There ismild calcification of the aortic valve.  5. The aortic root is normal is size and structure.  6. Normal right atrial size.  7. Upper normal left atrial size.  8. The interatrial septum appears to be lipomatous.  9. No atrial level shunt detected by color flow Doppler. 10. The mitral valve has been replaced. Regurgitation is trivial by color flow Doppler.  Assessment and Plan  1. Mitral valve replacement/Mitral stenosis -repeat echo shows normal LV function, stable MV repair - continue coumadin   2.Chronic diastolic HF - weight up 25 lbs over the last few months with some increased SOB. Exam is difficult due to body habitus to evaluate fluid status. She did have mildly elevated BNP in January. We have tried uptitrating her diuretics now on torsemide 80mg  bid. Weights unchanged but breathing  has improved. Unclear how much of weight gain is fluid vs regular weight gain, she denies eating more but due to issues with teeth eats mainly noodles and other high carb foods, limited physical activity, I think a good proportion of her weight gain though it was rapid has been diet and inactivity. Has tolerated high dose diuretics, really didn't see much improvement in symptoms until we got to the higher doses.  - repeat labs, continue current diuretics. If SOB becomes a recurrent issue would consider a RHC to clearly define fluid status.    F/u 4 months   Antoine Poche, M.D.

## 2018-09-12 NOTE — Patient Instructions (Signed)
Your physician recommends that you schedule a follow-up appointment in: 4 MONTHS WITH DR [facility]  Your physician recommends that you continue on your current medications as directed. Please refer to the Current Medication list given to you today.  Your physician recommends that you return for lab work TSH/BMP/MG  Thank you for choosing [facility]!!

## 2018-09-12 NOTE — Patient Instructions (Signed)
Hold coumadin tonight then decrease dose to 2 tablets daily except 1 tablet every Friday Recheck in 3 weeks

## 2018-10-02 ENCOUNTER — Telehealth: Payer: Self-pay | Admitting: Cardiology

## 2018-10-02 NOTE — Telephone Encounter (Signed)
° ° ° °  COVID-19 Pre-Screening Questions: ° °• Do you currently have a fever? No °•  °• Have you recently travelled on a cruise, internationally, or to [place], [place], [place], [place], [place], or [place], [place] ([employer]) ? No °•  °• Have you been in contact with someone that is currently pending confirmation of Covid19 testing or has been confirmed to have the Covid19 virus? No °•  °• Are you currently experiencing fatigue or cough? No  ° ° °   ° ° ° ° ° °

## 2018-10-03 ENCOUNTER — Ambulatory Visit (INDEPENDENT_AMBULATORY_CARE_PROVIDER_SITE_OTHER): Payer: Medicaid Other | Admitting: *Deleted

## 2018-10-03 ENCOUNTER — Other Ambulatory Visit: Payer: Self-pay

## 2018-10-03 DIAGNOSIS — Z954 Presence of other heart-valve replacement: Secondary | ICD-10-CM

## 2018-10-03 DIAGNOSIS — Z5181 Encounter for therapeutic drug level monitoring: Secondary | ICD-10-CM

## 2018-10-03 DIAGNOSIS — I052 Rheumatic mitral stenosis with insufficiency: Secondary | ICD-10-CM

## 2018-10-03 LAB — POCT INR: INR: 2.7 (ref 2.0–3.0)

## 2018-10-03 NOTE — Patient Instructions (Signed)
Continue coumadin 2 tablets daily except 1 tablet every Friday Recheck in 3 weeks

## 2018-10-24 ENCOUNTER — Telehealth: Payer: Self-pay | Admitting: *Deleted

## 2018-10-24 NOTE — Telephone Encounter (Signed)
° ° °  COVID-19 Pre-Screening Questions:   Do you currently have a fever? yes = cancel and refer to pcp for e-visit)  no  Have you recently travelled on a cruise, internationally, or to [place], [place], [place], [place], [place], or [place], [place] [employer]) ?  (yes = cancel, stay home, monitor symptoms, and contact pcp or initiate e-visit if symptoms develop)  no  Have you been in contact with someone that is currently pending confirmation of Covid19 testing or has been confirmed to have the Covid19 virus? es = cancel, stay home, away from tested individual, monitor symptoms, and contact pcp or initiate e-visit if symptoms develop)no  Are you currently experiencing fatigue or cough?  (yes = pt should be prepared to have a mask placed at the time of their visit).  no

## 2018-10-25 ENCOUNTER — Ambulatory Visit (INDEPENDENT_AMBULATORY_CARE_PROVIDER_SITE_OTHER): Payer: Medicaid Other | Admitting: Pharmacist Clinician (PhC)/ Clinical Pharmacy Specialist

## 2018-10-25 DIAGNOSIS — Z5181 Encounter for therapeutic drug level monitoring: Secondary | ICD-10-CM

## 2018-10-25 DIAGNOSIS — Z954 Presence of other heart-valve replacement: Secondary | ICD-10-CM | POA: Diagnosis not present

## 2018-10-25 LAB — POCT INR: INR: 2.1 (ref 2.0–3.0)

## 2018-10-31 ENCOUNTER — Other Ambulatory Visit: Payer: Self-pay | Admitting: Cardiology

## 2018-10-31 DIAGNOSIS — Z5181 Encounter for therapeutic drug level monitoring: Secondary | ICD-10-CM

## 2018-12-15 DIAGNOSIS — Z736 Limitation of activities due to disability: Secondary | ICD-10-CM

## 2018-12-18 ENCOUNTER — Other Ambulatory Visit: Payer: Self-pay

## 2018-12-18 ENCOUNTER — Ambulatory Visit (INDEPENDENT_AMBULATORY_CARE_PROVIDER_SITE_OTHER): Payer: Medicaid Other | Admitting: *Deleted

## 2018-12-18 DIAGNOSIS — Z5181 Encounter for therapeutic drug level monitoring: Secondary | ICD-10-CM | POA: Diagnosis not present

## 2018-12-18 DIAGNOSIS — Z954 Presence of other heart-valve replacement: Secondary | ICD-10-CM

## 2018-12-18 LAB — POCT INR: INR: 2.8 (ref 2.0–3.0)

## 2018-12-18 NOTE — Patient Instructions (Signed)
Continue coumadin 2 tablets daily except 1 tablet every Friday Recheck in 6 weeks

## 2018-12-25 ENCOUNTER — Other Ambulatory Visit: Payer: Self-pay

## 2018-12-25 ENCOUNTER — Ambulatory Visit: Payer: Medicaid Other | Admitting: Thoracic Surgery (Cardiothoracic Vascular Surgery)

## 2018-12-25 ENCOUNTER — Encounter: Payer: Self-pay | Admitting: Thoracic Surgery (Cardiothoracic Vascular Surgery)

## 2018-12-25 VITALS — BP 113/75 | HR 82 | Temp 97.7°F | Resp 20 | Ht 68.0 in | Wt 256.0 lb

## 2018-12-25 DIAGNOSIS — Z954 Presence of other heart-valve replacement: Secondary | ICD-10-CM

## 2018-12-25 NOTE — Patient Instructions (Addendum)
Continue all previous medications without any changes at this time.  You may resume unrestricted physical activity without any particular limitations at this time.  Check your weight on a regular basis and keep a log for your records.  Look for signs of fluid overload such as worsening swelling of your lower legs, increased shortness of breath with activity, and/or a dry nonproductive cough.  Discussed these findings with your cardiologist including whether or not you should adjust your fluid pill dosage (diuretic).  You have been advised of the numerous benefits associated with regular exercise and weight loss.  The long term benefits for your overall health and wellbeing cannot be overestimated.  Endocarditis is a potentially serious infection of heart valves or inside lining of the heart.  It occurs more commonly in patients with diseased heart valves (such as patient's with aortic or mitral valve disease) and in patients who have undergone heart valve repair or replacement.  Certain surgical and dental procedures may put you at risk, such as dental cleaning, other dental procedures, or any surgery involving the respiratory, urinary, gastrointestinal tract, gallbladder or prostate gland.   To minimize your chances for develooping endocarditis, maintain good oral health and seek prompt medical attention for any infections involving the mouth, teeth, gums, skin or urinary tract.    Always notify your doctor or dentist about your underlying heart valve condition before having any invasive procedures. You will need to take antibiotics before certain procedures, including all routine dental cleanings or other dental procedures.  Your cardiologist or dentist should prescribe these antibiotics for you to be taken ahead of time.

## 2018-12-25 NOTE — Progress Notes (Signed)
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    Her early postoperative recovery was uneventful and she was last seen here in our office on March 20, 2018.  Since then she has been followed carefully by Dr. Harl Bowie who has seen her on several occasions.  Patient has remained clinically stable although she continues to experience significant symptoms of exertional shortness of breath and fluid retention consistent with chronic diastolic congestive heart failure.  Most recent follow-up echocardiogram performed July 27, 2018 reveals normal left ventricular systolic function with ejection fraction estimated 50 to 55%.  Mechanical prosthetic valve in the mitral position is functioning normally with low transvalvular gradient and no paravalvular leak.  Right ventricular size and function is normal and there is felt to be trivial tricuspid regurgitation.  Patient returns for office today and reports that overall she does feel somewhat improved in comparison with how she felt prior to surgery, although she remains frustrated that she still gets short of breath with exertion and feels limited.  She remains chronically anticoagulated using warfarin and she has not had any bleeding problems or other complications.  Remainder of her review of systems unremarkable.   Current Outpatient Medications  Medication Sig Dispense Refill  . acetaminophen (TYLENOL) 325 MG tablet Take 2 tablets (650 mg total) by mouth every 6 (six) hours as  needed for mild pain.    Marland Kitchen albuterol (PROAIR HFA) 108 (90 Base) MCG/ACT inhaler Inhale 2 puffs into the lungs every 4 (four) hours as needed for wheezing or shortness of breath.     . ALPRAZolam (XANAX) 0.5 MG tablet Take 0.5 mg by mouth 4 (four) times daily as needed for anxiety.     Marland Kitchen aspirin EC 81 MG tablet Take 1 tablet (81 mg total) by mouth daily. 90 tablet 3  . fluticasone (FLONASE) 50 MCG/ACT nasal spray Place 2 sprays into both nostrils daily as needed for allergies.     . metoprolol tartrate (LOPRESSOR) 25 MG tablet Take 1 tablet (25 mg total) by mouth 2 (two) times daily. 180 tablet 1  . potassium chloride SA (K-DUR,KLOR-CON) 20 MEQ tablet Take 2 tablets (40 mEq total) by mouth daily. 180 tablet 1  . sertraline (ZOLOFT) 100 MG tablet Take 200 mg by mouth daily.     . traZODone (DESYREL) 100 MG tablet Take 200 mg by mouth at bedtime.     Marland Kitchen warfarin (COUMADIN) 2 MG tablet Take 2 tablets daily except 1 tablet on Fridays 60 tablet 4  . torsemide (DEMADEX) 20 MG tablet Take 4 tablets (80 mg total) by mouth 2 (two) times daily. 240 tablet 3   No current facility-administered medications for this visit.       Physical Exam:   BP 113/75   Pulse 82   Temp 97.7 F (36.5 C) (Skin)   Resp 20   Ht 5\' 8"  (1.727 m)   Wt 256 lb (116.1 kg)   SpO2 98% Comment:  RA  BMI 38.92 kg/m   General:  Obese but well-appearing  Chest:   Clear to auscultation  CV:   Regular rate and rhythm with mechanical heart valve sounds that are crisp, no murmur appreciated  Incisions:  Completely healed  Abdomen:  Soft nontender  Extremities:  Warm and well-perfused  Diagnostic Tests:  TRANSTHORACIC ECHOCARDIOGRAM REPORT     Patient Name:   Gloria James Date of Exam: 07/27/2018 Medical Rec #:  [id number]      Height:       68.0 in Accession #:    [id number]     Weight:       248.6 lb Date of Birth:  02/02/1976      BSA:          2.24 m Patient Age:    43 years       BP:           105/70 mmHg Patient  Gender: F              HR:           77 bpm. Exam Location:  [facility]    Procedure: 2D Echo, Cardiac Doppler and Color Doppler  Indications:     R06.02 SOB; I50.32 Chronic diastolic (congestive) heart failure   History:         Patient has prior history of Echocardiogram examinations. CHF,                  Mitral Valve Disease; Signs/Symptoms: Dyspnea; Risk Factors:                  Hypertension, Anemia and Former Smoker. - Mitral Valve                  Replacement; Date:12/22/17 Size:30mm Type:Sorin carbomedical                  optiform bileaflet mechanical valve.   Sonographer:     Jake Seats RDMS, RVT, RDCS Referring Phys:  [id number] [name] Diagnosing Phys: Nona Dell MD  IMPRESSIONS    1. The left ventricle has The cavity size is normal. There is no left ventricular wall thickness. Indeterminate diastolic filling patterns. The left ventricular diastology could not be evaluated due to mitral valve replacement/repair.  2. A 31 Sorin [employer]omedics mechanical valve is present in the mitral position.  3. Normal tricuspid valve.  4. The aortic valve tricuspid. There ismild calcification of the aortic valve.  5. The aortic root is normal is size and structure.  6. Normal right atrial size.  7. Upper normal left atrial size.  8. The interatrial septum appears to be lipomatous.  9. No atrial level shunt detected by color flow Doppler. 10. The mitral valve has been replaced. Regurgitation is trivial by color flow Doppler.  FINDINGS  Left Ventricle: The left ventricle has low normal systolic function of 50-55%. The cavity size is normal. There is no left ventricular wall thickness. Echo evidence of Indeterminate diastolic filling patterns. The left ventricular diastology could not  be evaluateddue to mitral valve replacement/repair. Right Ventricle: The right ventricle is normal in size. There is normal hypertrophy. There is normal systolic function. Left Atrium: The  left atrium is upper normal. Right Atrium: The right atrial size is normal in size. Interatrial Septum: No atrial level shunt detected by color flow Doppler. Increased thickness of the atrial septum sparing the fossa ovalis consistent with The interatrial septum appears to be lipomatous.  Pericardium: There is no evidence of pericardial effusion. There is a pericardial fat pad noted. Mitral Valve: The mitral valve has been replaced. Regurgitation is trivial by color flow Doppler. A 31Sorin [employer]omed[employer] mechanical valve is present in the mitral position. Tricuspid Valve: The tricuspid valve is normal in structure. Tricuspid regurgitation is trivial by color flow Doppler. Aortic Valve: The aortic valve tricuspid. There ismild calcification of the aortic valve. Pulmonic Valve: The pulmonic valve was not well visualized. The pulmonic valve is grossly normal. Pulmonic valve regurgitation is not visualized by color flow Doppler. Aorta: The aortic root is normal is size and structure. Venous: The inferior vena cava was normal in size with greater than 50% respiratory variablity.   LEFT VENTRICLE PLAX 2D (Teich)              Biplane EF (MOD) LV EF:          62.2 %       LV Biplane EF:   55.6 % LVIDd:          4.97 cm      LV A4C EF:       55.9 % LVIDs:          3.30 cm      LV A2C EF:       54.1 % LV PW:          0.97 cm LV IVS:         0.77 cm      Diastology LVOT diam:      2.00 cm      LV e' lateral:   10.20 cm/s LV SV:          72 ml        LV E/e' lateral: 13.8 LVOT Area:      3.14 cm     LV e' medial:    7.25 cm/s                              LV E/e' medial:  19.4 LV Volumes (MOD) LV area d, A2C:    23.20 cm LV area d, A4C:    25.40 cm LV area s, A2C:    14.80 cm LV area s, A4C:    15.90 cm LV major d, A2C:   7.38 cm LV major d, A4C:   6.84 cm LV major s, A2C:   6.37 cm LV major s, A4C:   6.03 cm LV vol d, MOD A2C: 61.6 ml LV vol d, MOD A4C: 82.7 ml LV vol s, MOD A2C: 28.3 ml LV  vol s, MOD A4C: 36.5 ml LV SV MOD A2C:     33.3 ml LV SV MOD A4C:     82.7 ml LV SV MOD BP:      41.0 ml  RIGHT VENTRICLE RV S prime:     8.32 cm/s TAPSE (M-mode): 1.8 cm RVSP:           23.4 mmHg  LEFT ATRIUM             Index       RIGHT ATRIUM LA diam:        3.40 cm 1.52 cm/m  RA Pressure: 3 mmHg LA Vol (A2C):           36.80 ml/m LA Vol (A4C):           32.80 ml/m LA Biplane Vol:  35.80 ml/m  AORTIC VALVE LVOT Vmax:   117.00 cm/s LVOT Vmean:  73.900 cm/s LVOT VTI:    0.211 m   AORTA Ao Root diam: 2.70 cm  MITRAL VALVE               TR Peak grad: 20.4 mmHg MV Area (PHT): 2.76 cm    TR Vmax:      226.00 cm/s MV Peak grad:  11.4 mmHg   RVSP:         23.4 mmHg MV Mean grad:  4.0 mmHg MV Vmax:       1.69 m/s MV Vmean:      95.0 cm/s MV VTI:        0.34 m MV PHT:        79.75 msec MV Decel Time: 275 msec MV E velocity: 141.00 cm/s MV A velocity: 94.70 cm/s MV E/A ratio:  1.49    Nona DellSamuel Mcdowell MD Electronically signed by Nona DellSamuel Mcdowell MD Signature Date/Time: 07/27/2018/1:54:54 PM      Impression:  Patient is doing well approximately 1 year status post minimally invasive mitral valve replacement using a mechanical prosthetic valve for rheumatic mitral regurgitation and mitral stenosis.  Patient continues to experience symptoms related to chronic diastolic congestive heart failure, although she admits that she has symptomatically improved in comparison with how she felt prior to surgery.  I personally reviewed the patient's most recent follow-up echocardiogram which demonstrates normal left and right ventricular function, normal functioning mechanical prosthetic valve in the mitral position, and trivial tricuspid regurgitation.  Plan:  We have not recommended any change the patient's current medications.  I have encouraged the patient to continue to search for mechanisms by ways she can make herself more physically active and lose weight.  The patient  has been reminded regarding the importance of dental hygiene and the lifelong need for antibiotic prophylaxis for all dental cleanings and other related invasive procedures.   I spent in excess of 15 minutes during the conduct of this office consultation and >50% of this time involved direct face-to-face encounter with the patient for counseling and/or coordination of their care.    Salvatore Decentlarence H. Cornelius Moraswen, MD 12/25/2018 9:47 AM

## 2019-01-03 ENCOUNTER — Ambulatory Visit (INDEPENDENT_AMBULATORY_CARE_PROVIDER_SITE_OTHER): Payer: Medicaid Other | Admitting: *Deleted

## 2019-01-03 DIAGNOSIS — Z954 Presence of other heart-valve replacement: Secondary | ICD-10-CM

## 2019-01-03 DIAGNOSIS — Z5181 Encounter for therapeutic drug level monitoring: Secondary | ICD-10-CM

## 2019-01-03 LAB — POCT INR: INR: 3.9 — AB (ref 2.0–3.0)

## 2019-01-03 NOTE — Patient Instructions (Signed)
Hold coumadin tonight then resume 2 tablets daily except 1 tablet every Friday Recheck in 4 weeks

## 2019-02-05 ENCOUNTER — Encounter: Payer: Self-pay | Admitting: Cardiology

## 2019-02-05 ENCOUNTER — Ambulatory Visit (INDEPENDENT_AMBULATORY_CARE_PROVIDER_SITE_OTHER): Payer: Medicaid Other | Admitting: Cardiology

## 2019-02-05 ENCOUNTER — Other Ambulatory Visit: Payer: Self-pay

## 2019-02-05 ENCOUNTER — Encounter: Payer: Self-pay | Admitting: *Deleted

## 2019-02-05 ENCOUNTER — Ambulatory Visit (INDEPENDENT_AMBULATORY_CARE_PROVIDER_SITE_OTHER): Payer: Medicaid Other | Admitting: *Deleted

## 2019-02-05 VITALS — BP 99/68 | HR 71 | Ht 68.0 in | Wt 244.4 lb

## 2019-02-05 DIAGNOSIS — I5032 Chronic diastolic (congestive) heart failure: Secondary | ICD-10-CM

## 2019-02-05 DIAGNOSIS — Z954 Presence of other heart-valve replacement: Secondary | ICD-10-CM | POA: Diagnosis not present

## 2019-02-05 DIAGNOSIS — Z5181 Encounter for therapeutic drug level monitoring: Secondary | ICD-10-CM

## 2019-02-05 DIAGNOSIS — R002 Palpitations: Secondary | ICD-10-CM | POA: Diagnosis not present

## 2019-02-05 LAB — POCT INR: INR: 1.9 — AB (ref 2.0–3.0)

## 2019-02-05 NOTE — Progress Notes (Signed)
Clinical Summary Ms. Carole BinningMeadows is a 43 y.o.female seen today for follow up of the following medical problems.     1. Rheumatic MV disease -- [facility]: rheumatic MV described, severe MS. Mean gradient 15. MVA by PHT 1.6. HR 84 during study. - 10/2017 TEE moderate MS, severe MR - referred for surgery - 12/22/17 had MVR with sorin carbomedical optiform bileaflet mechanical valve 31 mm - on coumadinand ASA  -02/2018 echo LVEF 50-55%, normal MVR function - not able to do cardiac rehab due to cost.    -Jan 2020 echo LVEF 50-55%, cannot eval diastolic function with valve repair, trivial MR, mean MV gradient 4 mmHg.   - - no recent LE edema, chronic DOE unchanged.        2.Chronic diasotlic HF - down 12 lbs since 11/2018 visit - working on dietary changes.  - occasional LE edema. Limiiting sodium intake.  - ongoing SOB at times. Trying to walk regularly.    3. Pulmonary nodule - followed by pcp   4. Palpitaions - can feel heart fluttering at times. Lasts for about 1 minutes, has had similar episodes in the past. - limited caffeine, no EtoH.  - about 2-3 episodes per month.    SH:  Past Medical History:  Diagnosis Date  . Acute pulmonary edema (HCC)   . Anemia   . CHF (congestive heart failure) (HCC)   . Chronic diastolic (congestive) heart failure (HCC)   . Depression   . Dyspnea   . Dysrhythmia    Patient states she would have episodes for tachycardia in the past  . History of kidney stones   . Panic disorder without agoraphobia   . PONV (postoperative nausea and vomiting)   . Precordial pain   . Rheumatic mitral stenosis with regurgitation   . 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 Tricuspid regurgitation      No Known Allergies   Current Outpatient Medications  Medication Sig  Dispense Refill  . acetaminophen (TYLENOL) 325 MG tablet Take 2 tablets (650 mg total) by mouth every 6 (six) hours as needed for mild pain.    Marland Kitchen. albuterol (PROAIR HFA) 108 (90 Base) MCG/ACT inhaler Inhale 2 puffs into the lungs every 4 (four) hours as needed for wheezing or shortness of breath.     . ALPRAZolam (XANAX) 0.5 MG tablet Take 0.5 mg by mouth 4 (four) times daily as needed for anxiety.     Marland Kitchen. aspirin EC 81 MG tablet Take 1 tablet (81 mg total) by mouth daily. 90 tablet 3  . fluticasone (FLONASE) 50 MCG/ACT nasal spray Place 2 sprays into both nostrils daily as needed for allergies.     . metoprolol tartrate (LOPRESSOR) 25 MG tablet Take 1 tablet (25 mg total) by mouth 2 (two) times daily. 180 tablet 1  . potassium chloride SA (K-DUR,KLOR-CON) 20 MEQ tablet Take 2 tablets (40 mEq total) by mouth daily. 180 tablet 1  . sertraline (ZOLOFT) 100 MG tablet Take 200 mg by mouth daily.     Marland Kitchen. torsemide (DEMADEX) 20 MG tablet Take 4 tablets (80 mg total) by mouth 2 (two) times daily. 240 tablet 3  . traZODone (DESYREL) 100 MG tablet Take 200 mg by mouth at bedtime.     Marland Kitchen. warfarin (COUMADIN) 2 MG tablet Take 2 tablets daily except 1 tablet on Fridays  60 tablet 4   No current facility-administered medications for this visit.      Past Surgical History:  Procedure Laterality Date  . APPENDECTOMY     age 23  . CARDIAC CATHETERIZATION  11/03/2017  . MITRAL VALVE REPAIR Right 12/22/2017   Procedure: MINIMALLY INVASIVE MITRAL VALVE REPLACEMENT(MVR);  Surgeon: Purcell Nails, MD;  Location: [facility] OR;  Service: Open Heart Surgery;  Laterality: Right;  . MULTIPLE EXTRACTIONS WITH ALVEOLOPLASTY N/A 12/06/2017   Procedure: Extraction of tooth #'s 4-6, 8-12, 20,22-29 and 32 with alveoloplasty;  Surgeon: Charlynne Pander, DDS;  Location: [facility] OR;  Service: Oral Surgery;  Laterality: N/A;  . MULTIPLE TOOTH EXTRACTIONS  2015  . OTHER SURGICAL HISTORY Left 2016   open surgery for kidney stones  . RIGHT AND  LEFT HEART CATH N/A 11/03/2017   Procedure: RIGHT AND LEFT HEART CATH;  Surgeon: Kathleene Hazel, MD;  Location: [facility] INVASIVE CV LAB;  Service: Cardiovascular;  Laterality: N/A;  . TEE WITHOUT CARDIOVERSION N/A 11/03/2017   Procedure: TRANSESOPHAGEAL ECHOCARDIOGRAM (TEE);  Surgeon: Lewayne Bunting, MD;  Location: [facility] ENDOSCOPY;  Service: Cardiovascular;  Laterality: N/A;  . TEE WITHOUT CARDIOVERSION N/A 12/22/2017   Procedure: TRANSESOPHAGEAL ECHOCARDIOGRAM (TEE);  Surgeon: Purcell Nails, MD;  Location: [facility] OR;  Service: Open Heart Surgery;  Laterality: N/A;  . TONSILLECTOMY     6     No Known Allergies    Family History  Problem Relation Age of Onset  . Cancer Father   . Dementia Maternal Grandmother   . Heart disease Maternal Grandfather        had open heart problems  . Cancer Paternal Grandmother   . Cancer Paternal Grandfather      Social History Ms. Gunst reports that she quit smoking about 16 months ago. Her smoking use included cigarettes. She started smoking about 27 years ago. She has a 13.00 pack-year smoking history. She has never used smokeless tobacco. Ms. Coronado reports previous alcohol use.   Review of Systems CONSTITUTIONAL: No weight loss, fever, chills, weakness or fatigue.  HEENT: Eyes: No visual loss, blurred vision, double vision or yellow sclerae.No hearing loss, sneezing, congestion, runny nose or sore throat.  SKIN: No rash or itching.  CARDIOVASCULAR: per hpi RESPIRATORY: per hpi GASTROINTESTINAL: No anorexia, nausea, vomiting or diarrhea. No abdominal pain or blood.  GENITOURINARY: No burning on urination, no polyuria NEUROLOGICAL: No headache, dizziness, syncope, paralysis, ataxia, numbness or tingling in the extremities. No change in bowel or bladder control.  MUSCULOSKELETAL: No muscle, back pain, joint pain or stiffness.  LYMPHATICS: No enlarged nodes. No history of splenectomy.  PSYCHIATRIC: No history of depression or anxiety.   ENDOCRINOLOGIC: No reports of sweating, cold or heat intolerance. No polyuria or polydipsia.  Marland Kitchen   Physical Examination Today's Vitals   02/05/19 1123  BP: 99/68  Pulse: 71  SpO2: 99%  Weight: 244 lb 6.4 oz (110.9 kg)  Height: 5\' 8"  (1.727 m)   Body mass index is 37.16 kg/m.  Gen: resting comfortably, no acute distress HEENT: no scleral icterus, pupils equal round and reactive, no palptable cervical adenopathy,  CV: RRR, no m/r/g, no jvd Resp: Clear to auscultation bilaterally GI: abdomen is soft, non-tender, non-distended, normal bowel sounds, no hepatosplenomegaly MSK: extremities are warm, no edema.  Skin: warm, no rash Neuro:  no focal deficits Psych: appropriate affect   Diagnostic Studies 10/2017 echo Study Conclusions  - Left ventricle: Systolic function was normal. The estimated ejection fraction was in  the range of 55% to 60%. Wall motion was normal; there were no regional wall motion abnormalities. - Aortic valve: There was trivial regurgitation. - Mitral valve: Mild thickening, consistent with rheumatic disease. Mobility of the posterior leaflet was severely restricted. The findings are consistent with moderate stenosis. There was severe regurgitation. Valve area by pressure half-time: 1.76 cm^2. - Left atrium: The atrium was severely dilated. No evidence of thrombus in the atrial cavity or appendage. - Atrial septum: No defect or patent foramen ovale was identified. - Tricuspid valve: No evidence of vegetation. There was mild-moderate regurgitation. - Pulmonic valve: No evidence of vegetation.  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   10/2017 cath 1. NO angiographic evidence of CAD 2. Large v-wave on wedge tracing c/w severe MR  Recommendations: Continue workup for surgical MV replacement/repair. I do not think she will be a candidate for balloon valvuloplasty given her severe MR.   02/2018 echo Study Conclusions  - Left ventricle: The cavity size was normal. Systolic function was normal. The estimated ejection fraction was in the range of 50% to 55%. Wall motion was normal; there were no regional wall motion abnormalities. Features are consistent with a pseudonormal left ventricular filling pattern, with concomitant abnormal relaxation and increased filling pressure (grade 2 diastolic dysfunction). Doppler parameters are consistent with high ventricular filling pressure. - Mitral valve: A mechanical prosthesis was present and functioning normally. Mean gradient (D): 4 mm Hg. Valve area by pressure half-time: 2.93 cm^2. Valve area by continuity equation (using LVOT flow): 2.1 cm^2. - Left atrium: The atrium was mildly dilated. - Tricuspid valve: There was mild regurgitation. - Pulmonic valve: There was trivial regurgitation.  Impressions:  - Low normal LVF with EF 50-55%, mild TR, stable mechanical MVR well seated with no perivalvular MR. The mean MVG is stable at 71mmHg and MVA 2.93cm2 by PHT and 2.1cm2 by continuity equation. Since last echo, mechanical MVR is new.   Jan 2020 echo IMPRESSIONS   1. The left ventricle has The cavity size is normal. There is no left ventricular wall thickness. Indeterminate diastolic filling patterns. The left ventricular diastology could not be evaluated due to mitral valve replacement/repair. 2. A 31 Sorin [employer]omed[employer] mechanical valve is present in the mitral position. 3. Normal tricuspid valve. 4. The aortic valve tricuspid. There ismild calcification of the aortic valve. 5. The aortic root is  normal is size and structure. 6. Normal right atrial size. 7. Upper normal left atrial size. 8. The interatrial septum appears to be lipomatous. 9. No atrial level shunt detected by color flow Doppler. 10. The mitral valve has been replaced. Regurgitation is trivial by color flow Doppler.     Assessment and Plan  1. Mitral valve replacement/Mitral stenosis -repeat echo shows normal LV function, normal MVR - continue coumadin and ASA in setting of mecahnical valve.    2.Chronic diastolic HF -weights trendign down, no significant signs of fluid overload by exam - I think her chronic DOE is more related to deconditioning, fairly sedentary after her surgery. Encouraged increased exercise, she was able to lose 12 lbs which is great, had gained about 25 lbs after her surgery.   3. Palpitations - fairly infrequent, continue to monitor at  this time. If progress would plan on home heart monitor.   F/u 6 months     Antoine PocheJonathan F. Tiarra Anastacio, M.D.

## 2019-02-05 NOTE — Patient Instructions (Signed)
Your physician wants you to follow-up in: 4 MONTHS WITH DR BRANCH  Your physician recommends that you continue on your current medications as directed. Please refer to the Current Medication list given to you today.  Thank you for choosing  [facility]!!   

## 2019-02-05 NOTE — Patient Instructions (Signed)
Take coumadin 3 tablets tonight and tomorrow night then resume 2 tablets daily except 1 tablet every Friday Recheck in 4 weeks

## 2019-03-05 ENCOUNTER — Other Ambulatory Visit: Payer: Self-pay | Admitting: Cardiology

## 2019-03-05 DIAGNOSIS — Z5181 Encounter for therapeutic drug level monitoring: Secondary | ICD-10-CM

## 2019-03-08 ENCOUNTER — Other Ambulatory Visit: Payer: Self-pay

## 2019-03-08 ENCOUNTER — Ambulatory Visit (INDEPENDENT_AMBULATORY_CARE_PROVIDER_SITE_OTHER): Payer: Medicaid Other | Admitting: *Deleted

## 2019-03-08 DIAGNOSIS — Z5181 Encounter for therapeutic drug level monitoring: Secondary | ICD-10-CM

## 2019-03-08 DIAGNOSIS — Z954 Presence of other heart-valve replacement: Secondary | ICD-10-CM

## 2019-03-08 LAB — POCT INR: INR: 2.3 (ref 2.0–3.0)

## 2019-03-08 NOTE — Patient Instructions (Signed)
Take coumadin 3 tablets tonight then resume 2 tablets daily except 1 tablet every Friday Recheck in 4 weeks

## 2019-03-26 ENCOUNTER — Other Ambulatory Visit: Payer: Self-pay | Admitting: Cardiology

## 2019-04-05 ENCOUNTER — Ambulatory Visit (INDEPENDENT_AMBULATORY_CARE_PROVIDER_SITE_OTHER): Payer: Medicaid Other | Admitting: *Deleted

## 2019-04-05 ENCOUNTER — Other Ambulatory Visit: Payer: Self-pay

## 2019-04-05 DIAGNOSIS — Z5181 Encounter for therapeutic drug level monitoring: Secondary | ICD-10-CM | POA: Diagnosis not present

## 2019-04-05 DIAGNOSIS — Z954 Presence of other heart-valve replacement: Secondary | ICD-10-CM

## 2019-04-05 LAB — POCT INR: INR: 2.9 (ref 2.0–3.0)

## 2019-04-05 NOTE — Patient Instructions (Signed)
Continue warfarin 2 tablets daily except 1 tablet every Friday Recheck in 4 weeks 

## 2019-05-03 ENCOUNTER — Ambulatory Visit (INDEPENDENT_AMBULATORY_CARE_PROVIDER_SITE_OTHER): Payer: Medicaid Other | Admitting: *Deleted

## 2019-05-03 ENCOUNTER — Other Ambulatory Visit: Payer: Self-pay

## 2019-05-03 DIAGNOSIS — Z5181 Encounter for therapeutic drug level monitoring: Secondary | ICD-10-CM | POA: Diagnosis not present

## 2019-05-03 DIAGNOSIS — Z954 Presence of other heart-valve replacement: Secondary | ICD-10-CM

## 2019-05-03 LAB — POCT INR: INR: 4.7 — AB (ref 2.0–3.0)

## 2019-05-03 NOTE — Patient Instructions (Signed)
Hold warfarin tonight then resume 2 tablets daily except 1 tablet every Friday Recheck in 3 weeks

## 2019-05-22 ENCOUNTER — Other Ambulatory Visit: Payer: Self-pay

## 2019-05-22 ENCOUNTER — Ambulatory Visit (INDEPENDENT_AMBULATORY_CARE_PROVIDER_SITE_OTHER): Payer: Medicaid Other | Admitting: *Deleted

## 2019-05-22 DIAGNOSIS — Z954 Presence of other heart-valve replacement: Secondary | ICD-10-CM | POA: Diagnosis not present

## 2019-05-22 DIAGNOSIS — Z5181 Encounter for therapeutic drug level monitoring: Secondary | ICD-10-CM | POA: Diagnosis not present

## 2019-05-22 LAB — POCT INR: INR: 3.2 — AB (ref 2.0–3.0)

## 2019-05-22 NOTE — Patient Instructions (Signed)
Continue warfarin 2 tablets daily except 1 tablet every Friday Recheck in 4 weeks 

## 2019-06-01 ENCOUNTER — Other Ambulatory Visit: Payer: Self-pay | Admitting: Cardiology

## 2019-06-11 ENCOUNTER — Other Ambulatory Visit: Payer: Self-pay

## 2019-06-11 ENCOUNTER — Ambulatory Visit (INDEPENDENT_AMBULATORY_CARE_PROVIDER_SITE_OTHER): Payer: Medicaid Other | Admitting: Cardiology

## 2019-06-11 ENCOUNTER — Encounter: Payer: Self-pay | Admitting: Cardiology

## 2019-06-11 VITALS — BP 117/79 | HR 69 | Ht 68.0 in | Wt 247.4 lb

## 2019-06-11 DIAGNOSIS — R0789 Other chest pain: Secondary | ICD-10-CM

## 2019-06-11 DIAGNOSIS — Z954 Presence of other heart-valve replacement: Secondary | ICD-10-CM | POA: Diagnosis not present

## 2019-06-11 DIAGNOSIS — R079 Chest pain, unspecified: Secondary | ICD-10-CM | POA: Diagnosis not present

## 2019-06-11 DIAGNOSIS — I5032 Chronic diastolic (congestive) heart failure: Secondary | ICD-10-CM

## 2019-06-11 DIAGNOSIS — R002 Palpitations: Secondary | ICD-10-CM

## 2019-06-11 NOTE — Progress Notes (Signed)
Clinical Summary Ms. Tomasini is a 43 y.o.female seen today for follow up of the following medical problems.   1. Rheumatic MV disease -- 08/2017 echo [facility]: rheumatic MV described, severe MS. Mean gradient 15. MVA by PHT 1.6. HR 84 during study. - 10/2017 TEE moderate MS, severe MR - referred for surgery  - 12/22/17 had MVR with sorin carbomedical optiform bileaflet mechanical valve 31 mm - on coumadinand ASA  -02/2018 echo LVEF 50-55%, normal MVR function - not able to do cardiac rehab due to cost.    -Jan 2020 echo LVEF 50-55%, cannot eval diastolic function with valve repair, trivial MR, mean MV gradient 4 mmHg.      2.Chronic diasotlic HF - reports some mild weight gain at home. Some occasional LE edema, mainly feels abdomninal distension. Can have some SOB - has required and tolerated high doses of diuretic, has been on torsemide 80mg  bid.    3. Pulmonary nodule - followed by pcp   4. Chest pain - soreness right side and heaviness and midchest.  - can occur at any time - not positional. Lasts 30-45 minutes. +SOB.  - some abdominal distension, some LE edema.     Past Medical History:  Diagnosis Date  . Acute pulmonary edema (HCC)   . Anemia   . CHF (congestive heart failure) (HCC)   . Chronic diastolic (congestive) heart failure (HCC)   . Depression   . Dyspnea   . Dysrhythmia    Patient states she would have episodes for tachycardia in the past  . History of kidney stones   . Panic disorder without agoraphobia   . PONV (postoperative nausea and vomiting)   . Precordial pain   . Rheumatic mitral stenosis with regurgitation   . 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 Tricuspid regurgitation      No Known Allergies   Current Outpatient Medications  Medication Sig Dispense  Refill  . acetaminophen (TYLENOL) 325 MG tablet Take 2 tablets (650 mg total) by mouth every 6 (six) hours as needed for mild pain.    12/24/2017 albuterol (PROAIR HFA) 108 (90 Base) MCG/ACT inhaler Inhale 2 puffs into the lungs every 4 (four) hours as needed for wheezing or shortness of breath.     . ALPRAZolam (XANAX) 0.5 MG tablet Take 0.5 mg by mouth 4 (four) times daily as needed for anxiety.     Marland Kitchen aspirin EC 81 MG tablet Take 1 tablet (81 mg total) by mouth daily. 90 tablet 3  . fluticasone (FLONASE) 50 MCG/ACT nasal spray Place 2 sprays into both nostrils daily as needed for allergies.     . metoprolol tartrate (LOPRESSOR) 25 MG tablet Take 1 tablet (25 mg total) by mouth 2 (two) times daily. 180 tablet 1  . potassium chloride SA (K-DUR) 20 MEQ tablet TAKE 2 TABLETS BY MOUTH ONCE DAILY. 60 tablet 6  . sertraline (ZOLOFT) 100 MG tablet Take 200 mg by mouth daily.     Marland Kitchen torsemide (DEMADEX) 20 MG tablet TAKE 4 TABLETS TWICE DAILY. 240 tablet 0  . traZODone (DESYREL) 100 MG tablet Take 200 mg by mouth at bedtime.     Marland Kitchen warfarin (COUMADIN) 2 MG tablet TAKE 2 TABLETS ONCE A DAY OR AS DIRECTED BY COUMADIN CLINIC. 65 tablet 2   No current facility-administered medications for this visit.  Past Surgical History:  Procedure Laterality Date  . APPENDECTOMY     age 43  . CARDIAC CATHETERIZATION  11/03/2017  . MITRAL VALVE REPAIR Right 12/22/2017   Procedure: MINIMALLY INVASIVE MITRAL VALVE REPLACEMENT(MVR);  Surgeon: Purcell Nailswen, Clarence H, MD;  [facility];  Service: Open Heart Surgery;  Laterality: Right;  . MULTIPLE EXTRACTIONS WITH ALVEOLOPLASTY N/A 12/06/2017   Procedure: Extraction of tooth #'s 4-6, 8-12, 20,22-29 and 32 with alveoloplasty;  Surgeon: Charlynne PanderKulinski, Ronald F, DDS;  [facility];  Service: Oral Surgery;  Laterality: N/A;  . MULTIPLE TOOTH EXTRACTIONS  2015  . OTHER SURGICAL HISTORY Left 2016   open surgery for kidney stones  . RIGHT AND LEFT HEART CATH N/A 11/03/2017   Procedure:  RIGHT AND LEFT HEART CATH;  Surgeon: Kathleene HazelMcAlhany, Christopher D, MD;  Location: [facility] INVASIVE CV LAB;  Service: Cardiovascular;  Laterality: N/A;  . TEE WITHOUT CARDIOVERSION N/A 11/03/2017   Procedure: TRANSESOPHAGEAL ECHOCARDIOGRAM (TEE);  Surgeon: Lewayne Buntingrenshaw, Brian S, MD;  [facility];  Service: Cardiovascular;  Laterality: N/A;  . TEE WITHOUT CARDIOVERSION N/A 12/22/2017   Procedure: TRANSESOPHAGEAL ECHOCARDIOGRAM (TEE);  Surgeon: Purcell Nailswen, Clarence H, MD;  [facility];  Service: Open Heart Surgery;  Laterality: N/A;  . TONSILLECTOMY     6     No Known Allergies    Family History  Problem Relation Age of Onset  . Cancer Father   . Dementia Maternal Grandmother   . Heart disease Maternal Grandfather        had open heart problems  . Cancer Paternal Grandmother   . Cancer Paternal Grandfather      Social History Ms. Carole BinningMeadows reports that she quit smoking about 20 months ago. Her smoking use included cigarettes. She started smoking about 27 years ago. She has a 13.00 pack-year smoking history. She has never used smokeless tobacco. Ms. Carole BinningMeadows reports previous alcohol use.   Review of Systems CONSTITUTIONAL: No weight loss, fever, chills, weakness or fatigue.  HEENT: Eyes: No visual loss, blurred vision, double vision or yellow sclerae.No hearing loss, sneezing, congestion, runny nose or sore throat.  SKIN: No rash or itching.  CARDIOVASCULAR: per hpi RESPIRATORY: per hpi GASTROINTESTINAL: No anorexia, nausea, vomiting or diarrhea. No abdominal pain or blood.  GENITOURINARY: No burning on urination, no polyuria NEUROLOGICAL: No headache, dizziness, syncope, paralysis, ataxia, numbness or tingling in the extremities. No change in bowel or bladder control.  MUSCULOSKELETAL: No muscle, back pain, joint pain or stiffness.  LYMPHATICS: No enlarged nodes. No history of splenectomy.  PSYCHIATRIC: No history of depression or anxiety.  ENDOCRINOLOGIC: No reports of sweating, cold or  heat intolerance. No polyuria or polydipsia.  Marland Kitchen.   Physical Examination Today's Vitals   06/11/19 1121  BP: 117/79  Pulse: 69  SpO2: 99%  Weight: 247 lb 6.4 oz (112.2 kg)  Height: 5\' 8"  (1.727 m)   Body mass index is 37.62 kg/m.  Gen: resting comfortably, no acute distress HEENT: no scleral icterus, pupils equal round and reactive, no palptable cervical adenopathy,  CV: RRR, mecahnical S2 Resp: Clear to auscultation bilaterally GI: abdomen is soft, non-tender, non-distended, normal bowel sounds, no hepatosplenomegaly MSK: extremities are warm, no edema.  Skin: warm, no rash Neuro:  no focal deficits Psych: appropriate affect   Diagnostic Studies 10/2017 echo Study Conclusions  - Left ventricle: Systolic function was normal. The estimated ejection fraction was in the range of 55% to 60%. Wall motion was normal; there were no regional wall motion abnormalities. - Aortic  valve: There was trivial regurgitation. - Mitral valve: Mild thickening, consistent with rheumatic disease. Mobility of the posterior leaflet was severely restricted. The findings are consistent with moderate stenosis. There was severe regurgitation. Valve area by pressure half-time: 1.76 cm^2. - Left atrium: The atrium was severely dilated. No evidence of thrombus in the atrial cavity or appendage. - Atrial septum: No defect or patent foramen ovale was identified. - Tricuspid valve: No evidence of vegetation. There was mild-moderate regurgitation. - Pulmonic valve: No evidence of vegetation.  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   10/2017 cath 1. NO  angiographic evidence of CAD 2. Large v-wave on wedge tracing c/w severe MR  Recommendations: Continue workup for surgical MV replacement/repair. I do not think she will be a candidate for balloon valvuloplasty given her severe MR.   02/2018 echo Study Conclusions  - Left ventricle: The cavity size was normal. Systolic function was normal. The estimated ejection fraction was in the range of 50% to 55%. Wall motion was normal; there were no regional wall motion abnormalities. Features are consistent with a pseudonormal left ventricular filling pattern, with concomitant abnormal relaxation and increased filling pressure (grade 2 diastolic dysfunction). Doppler parameters are consistent with high ventricular filling pressure. - Mitral valve: A mechanical prosthesis was present and functioning normally. Mean gradient (D): 4 mm Hg. Valve area by pressure half-time: 2.93 cm^2. Valve area by continuity equation (using LVOT flow): 2.1 cm^2. - Left atrium: The atrium was mildly dilated. - Tricuspid valve: There was mild regurgitation. - Pulmonic valve: There was trivial regurgitation.  Impressions:  - Low normal LVF with EF 50-55%, mild TR, stable mechanical MVR well seated with no perivalvular MR. The mean MVG is stable at 54mmHg and MVA 2.93cm2 by PHT and 2.1cm2 by continuity equation. Since last echo, mechanical MVR is new.   Jan 2020 echo IMPRESSIONS   1. The left ventricle has The cavity size is normal. There is no left ventricular wall thickness. Indeterminate diastolic filling patterns. The left ventricular diastology could not be evaluated due to mitral valve replacement/repair. 2. A 31 Sorin Carbomedics mechanical valve is present in the mitral position. 3. Normal tricuspid valve. 4. The aortic valve tricuspid. There ismild calcification of the aortic valve. 5. The aortic root is normal is size and structure. 6. Normal right atrial  size. 7. Upper normal left atrial size. 8. The interatrial septum appears to be lipomatous. 9. No atrial level shunt detected by color flow Doppler. 10. The mitral valve has been replaced. Regurgitation is trivial by color flow Doppler.     Assessment and Plan   1. Mitral valve replacement/Mitral stenosis -last echo shows normal LV function, normal MVR - continue coumadin and ASA in setting of mecahnical valve.  - no changes in therapy.    2.Chronic diastolic HF -has required and tolerated high doses of torsemide - some recent weight gain and abdominal distension - obtain BMET/Mg/BNP. Pending labs would consider giving her additional torsemide dose likely 40mg  to to prn in the afternoons.   3. Chest pain -not consistent with ischemia. Cath last year was normal. Not consistent with PE, she also is on anticoag as well - could be related to some fluid retention, perhaps some  lingering soreness from the surgery - follow symptoms for now.  - EKG today SR, no ischemic changes   F/u 4 months  Antoine Poche, M.D.

## 2019-06-11 NOTE — Progress Notes (Signed)
   Opened in error, please see Dr Harl Bowie clinic note for today

## 2019-06-11 NOTE — Patient Instructions (Signed)
Your physician recommends that you schedule a follow-up appointment in: [place]  Your physician recommends that you continue on your current medications as directed. Please refer to the Current Medication list given to you today.  Your physician recommends that you return for lab work BMP/MG/BNP  Thank you for choosing [facility]!!

## 2019-06-19 ENCOUNTER — Ambulatory Visit (INDEPENDENT_AMBULATORY_CARE_PROVIDER_SITE_OTHER): Payer: Medicaid Other | Admitting: *Deleted

## 2019-06-19 ENCOUNTER — Other Ambulatory Visit: Payer: Self-pay

## 2019-06-19 DIAGNOSIS — Z954 Presence of other heart-valve replacement: Secondary | ICD-10-CM

## 2019-06-19 DIAGNOSIS — Z5181 Encounter for therapeutic drug level monitoring: Secondary | ICD-10-CM | POA: Diagnosis not present

## 2019-06-19 LAB — POCT INR: INR: 2.3 (ref 2.0–3.0)

## 2019-06-19 NOTE — Patient Instructions (Signed)
Take 3 tablets today then resume 2 tablets daily except 1 tablet every Friday Recheck in 4 weeks

## 2019-07-17 ENCOUNTER — Ambulatory Visit (INDEPENDENT_AMBULATORY_CARE_PROVIDER_SITE_OTHER): Payer: Medicaid Other | Admitting: *Deleted

## 2019-07-17 ENCOUNTER — Other Ambulatory Visit: Payer: Self-pay

## 2019-07-17 DIAGNOSIS — Z5181 Encounter for therapeutic drug level monitoring: Secondary | ICD-10-CM | POA: Diagnosis not present

## 2019-07-17 DIAGNOSIS — Z954 Presence of other heart-valve replacement: Secondary | ICD-10-CM

## 2019-07-17 LAB — POCT INR: INR: 2.6 (ref 2.0–3.0)

## 2019-07-17 NOTE — Patient Instructions (Signed)
Continue warfarin 2 tablets daily except 1 tablet every Friday Recheck in 4 weeks

## 2019-07-24 ENCOUNTER — Telehealth: Payer: Self-pay | Admitting: *Deleted

## 2019-07-24 NOTE — Telephone Encounter (Signed)
-----   Message from Antoine Poche, MD sent at 07/20/2019  4:39 PM EST ----- Labs look good other than potassium a little low. Verify she is taking daily, if so increase to x 2 days then resume prior dose. For her torsemide continue 80mg  bid, may take additional 40mg  in afternoon as needed for swelling   MD

## 2019-07-24 NOTE — Telephone Encounter (Signed)
Pt verified 40 meq of potassium and voiced understanding of change - updated medication list

## 2019-08-03 ENCOUNTER — Other Ambulatory Visit: Payer: Self-pay | Admitting: Cardiology

## 2019-08-03 DIAGNOSIS — Z5181 Encounter for therapeutic drug level monitoring: Secondary | ICD-10-CM

## 2019-08-14 ENCOUNTER — Ambulatory Visit (INDEPENDENT_AMBULATORY_CARE_PROVIDER_SITE_OTHER): Payer: Medicaid Other | Admitting: *Deleted

## 2019-08-14 ENCOUNTER — Other Ambulatory Visit: Payer: Self-pay

## 2019-08-14 DIAGNOSIS — Z5181 Encounter for therapeutic drug level monitoring: Secondary | ICD-10-CM | POA: Diagnosis not present

## 2019-08-14 DIAGNOSIS — Z954 Presence of other heart-valve replacement: Secondary | ICD-10-CM | POA: Diagnosis not present

## 2019-08-14 LAB — POCT INR: INR: 1.9 — AB (ref 2.0–3.0)

## 2019-08-14 MED ORDER — WARFARIN SODIUM 2 MG PO TABS: ORAL_TABLET | ORAL | 6 refills | Status: DC

## 2019-08-14 NOTE — Patient Instructions (Signed)
Take warfarin 3 tablets tonight and tomorrow night then resume 2 tablets daily except 1 tablet every Friday Recheck in 3 weeks

## 2019-09-04 ENCOUNTER — Other Ambulatory Visit: Payer: Self-pay

## 2019-09-04 ENCOUNTER — Ambulatory Visit (INDEPENDENT_AMBULATORY_CARE_PROVIDER_SITE_OTHER): Payer: Medicaid Other | Admitting: *Deleted

## 2019-09-04 DIAGNOSIS — Z5181 Encounter for therapeutic drug level monitoring: Secondary | ICD-10-CM

## 2019-09-04 DIAGNOSIS — Z954 Presence of other heart-valve replacement: Secondary | ICD-10-CM

## 2019-09-04 LAB — POCT INR: INR: 3.1 — AB (ref 2.0–3.0)

## 2019-09-04 NOTE — Patient Instructions (Signed)
Continue warfarin 2 tablets daily except 1 tablet every Friday Recheck in 4 weeks 

## 2019-09-12 IMAGING — CT CT ANGIO CHEST
1 of 3 series · 15 of 32 positions shown · IV contrast (APPLIED)
Comparison: Prior CT scan of the chest 09/20/2017; prior CT scan of
the abdomen and pelvis 07/26/2015

CLINICAL DATA: 42-year-old female with a history of rheumatic
mitral stenosis and congestive heart failure 1 month previously now
with recent weight gain, chest pain and left arm numbness.

EXAM:
CT ANGIOGRAPHY CHEST, ABDOMEN AND PELVIS
TECHNIQUE: Multidetector CT imaging through the chest, abdomen and pelvis was
performed using the standard protocol during bolus administration of
intravenous contrast. Multiplanar reconstructed images and MIPs were
obtained and reviewed to evaluate the vascular anatomy.
CONTRAST:  75mL [id number] IOPAMIDOL ([id number]) INJECTION 76%

[Series 5: cap (person_name) 3.0 b31s · axial · 0.93mm/px · z∈[-614,-20]mm · 15 of 228 slices shown]
[im 15/228  lung]
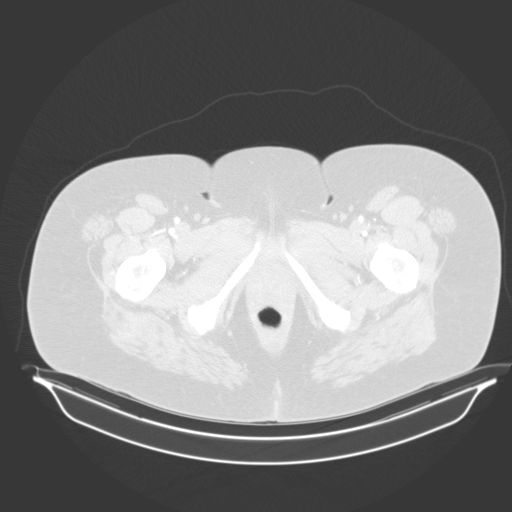
[im 29/228  soft-tissue]
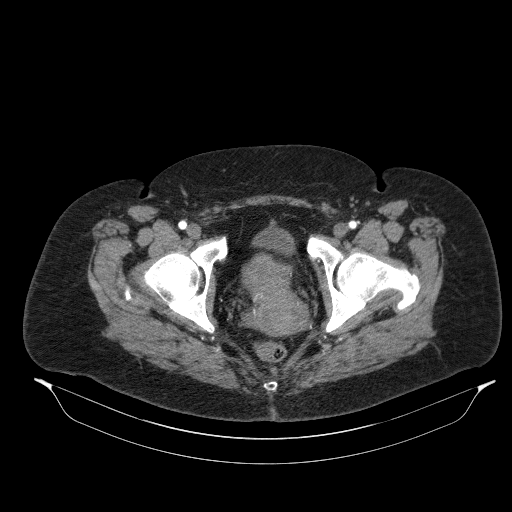
[im 43/228  lung]
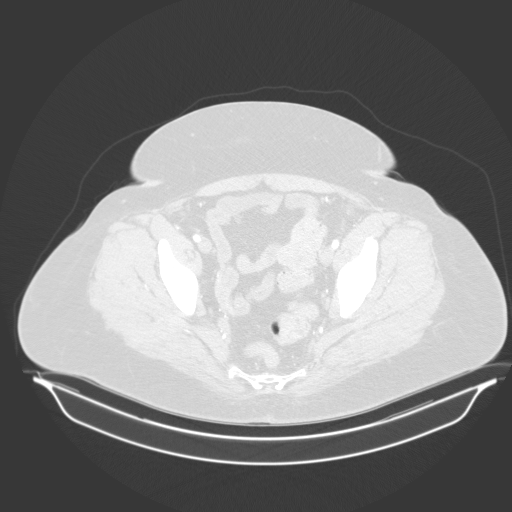
[im 57/228  soft-tissue]
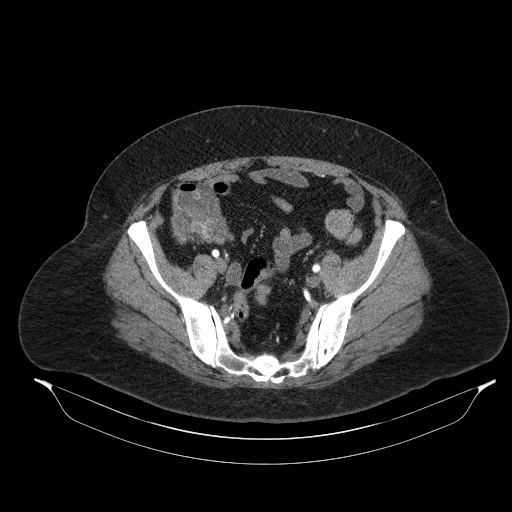
[im 71/228  lung]
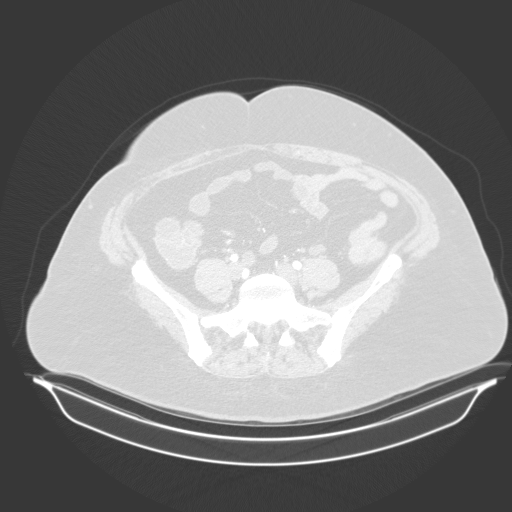
[im 86/228  soft-tissue]
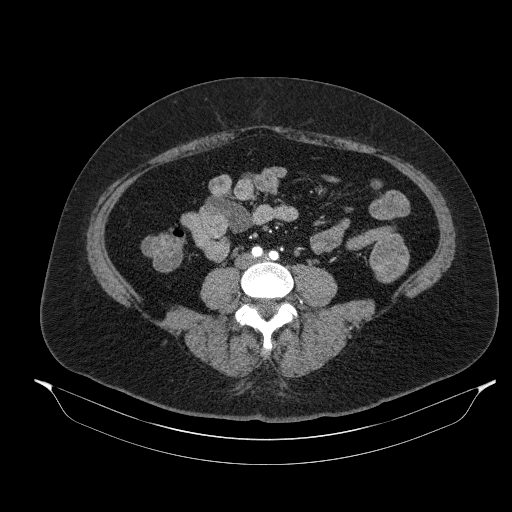
[im 100/228  lung]
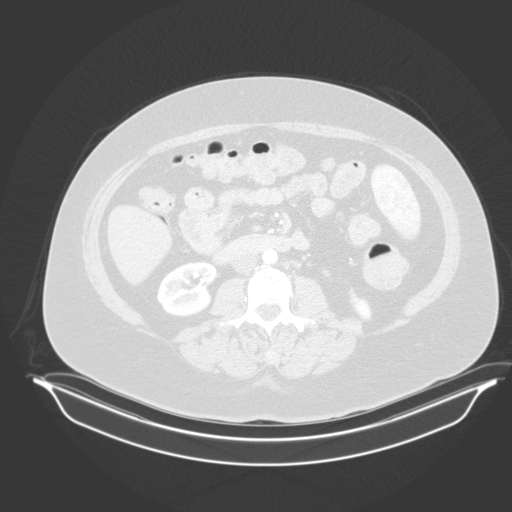
[im 114/228  soft-tissue]
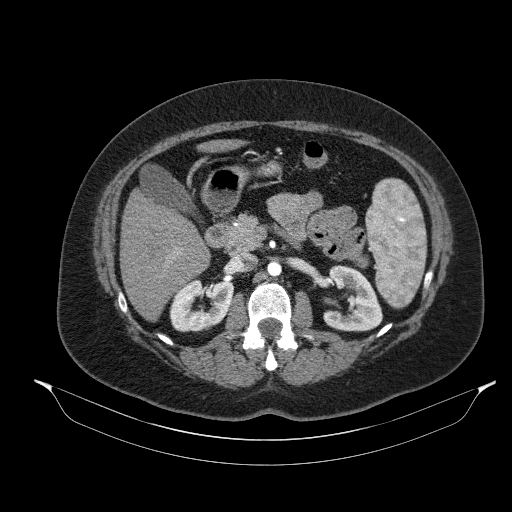
[im 128/228  lung]
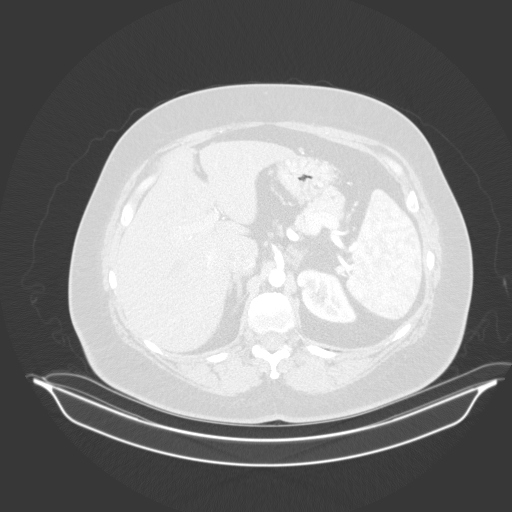
[im 142/228  soft-tissue]
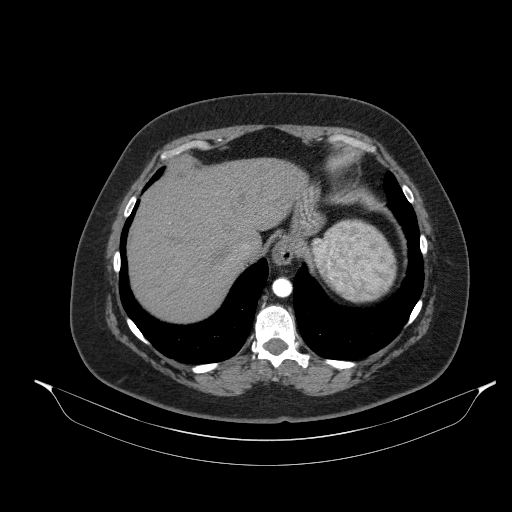
[im 157/228  lung]
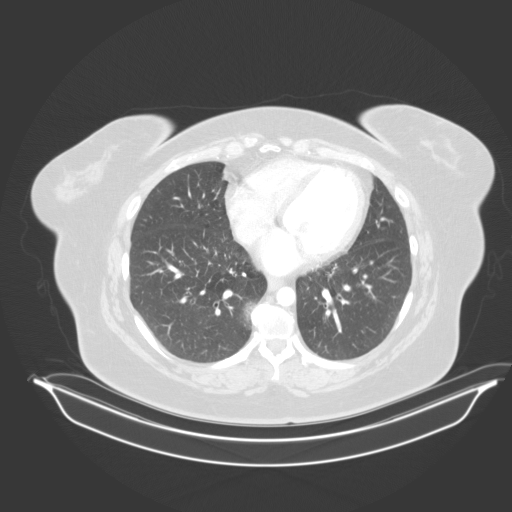
[im 171/228  soft-tissue]
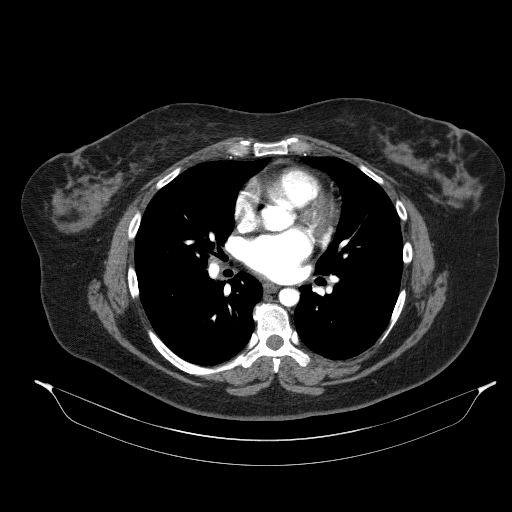
[im 185/228  lung]
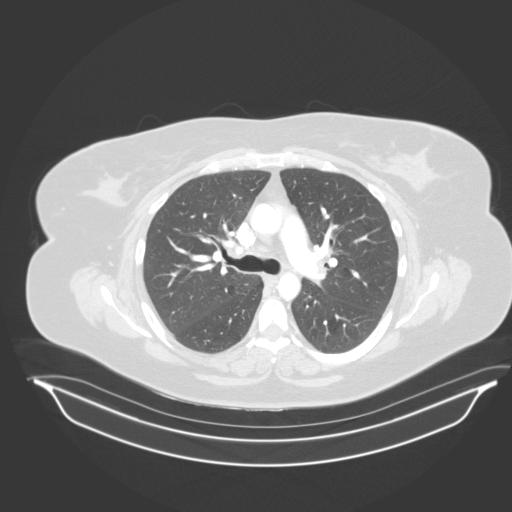
[im 199/228  soft-tissue]
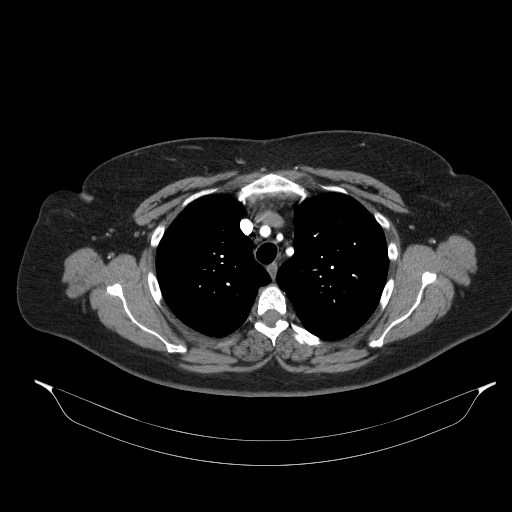
[im 213/228  lung]
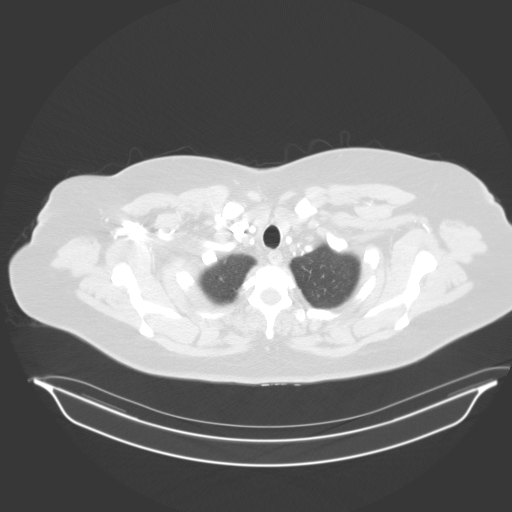

[15 of 32 positions shown; findings below may reference images not displayed]

FINDINGS: CTA CHEST FINDINGS

Cardiovascular: Conventional 3 vessel aortic arch anatomy. The aorta
is normal in size. No evidence of aneurysm, or dissection.
Cardiomegaly with dilatation of the left atrium and left atrial
appendage as well as the left ventricle. The mitral valve is
diffusely thickened consistent with the clinical history of
rheumatic heart disease. The right heart appears relatively normal.
The main pulmonary artery is normal in size. No central PE. No
evidence of pericardial effusion.

Mediastinum/Nodes: Unremarkable CT appearance of the thyroid gland.
No suspicious mediastinal or hilar adenopathy. No soft tissue
mediastinal mass. Small hiatal hernia.

Lungs/Pleura: Scattered tiny 2 and 3 mm subtle nodular opacities in
both lung apices. The lungs are otherwise relatively clear. No
evidence of acute pulmonary edema, focal airspace consolidation,
pleural effusion or pneumothorax. No suspicious pulmonary nodules
are identified. There is a mild air trapping present in the lower
lobes.

Musculoskeletal: No acute fracture or aggressive appearing lytic or
blastic osseous lesion.

Review of the MIP images confirms the above findings.

CTA ABDOMEN AND PELVIS FINDINGS

VASCULAR

Aorta: Normal caliber aorta without aneurysm, dissection, vasculitis
or significant stenosis.

Celiac: Patent without evidence of aneurysm, dissection, vasculitis
or significant stenosis.

SMA: Patent without evidence of aneurysm, dissection, vasculitis or
significant stenosis.

Renals: Both renal arteries are patent without evidence of aneurysm,
dissection, vasculitis, fibromuscular dysplasia or significant
stenosis.

IMA: Patent without evidence of aneurysm, dissection, vasculitis or
significant stenosis.

Inflow: Patent without evidence of aneurysm, dissection, vasculitis
or significant stenosis.

Veins: No obvious venous abnormality within the limitations of this
arterial phase study.

Review of the MIP images confirms the above findings.

NON-VASCULAR

Hepatobiliary: Normal hepatic contour and morphology. No discrete
hepatic lesions. Normal appearance of the gallbladder. No intra or
extrahepatic biliary ductal dilatation.

Pancreas: Unremarkable. No pancreatic ductal dilatation or
surrounding inflammatory changes.

Spleen: Splenomegaly. The spleen measures up to 17 cm. No discrete
splenic lesion.

Adrenals/Urinary Tract: Normal adrenal glands bilaterally. Slight
interval decrease in size of a low-attenuation lesion in the upper
pole of the right kidney compared to 07/26/2015. This likely
represents partial involution of a mildly complex renal cyst. Small
8 mm fat attenuation lesion in the lower pole of the right kidney
consistent with a small angiomyolipoma. No evidence of
hydronephrosis. Parenchymal calcification in a region of renal
cortical scarring in the interpolar left kidney. Small stones versus
milk of calcium also present within dilated calices in the left
lower pole. There is some adjacent focal renal cortical scarring at
this location as well. The ureters and bladder are unremarkable.

Stomach/Bowel: No evidence of obstruction or focal bowel wall
thickening. The terminal ileum is unremarkable. The appendix is
surgically absent.

Lymphatic: No suspicious lymphadenopathy.

Reproductive: IUD in the expected location. Otherwise, uterus and
bilateral adnexa are unremarkable.

Other: No abdominal wall hernia or abnormality. No abdominopelvic
ascites.

Musculoskeletal: No acute or significant osseous findings.

Review of the MIP images confirms the above findings.
IMPRESSION: CTA CHEST

1. Diffusely thickened mitral valve consistent with the clinical
history of rheumatic valvular disease. There is associated left
atrial, left atrial appendage and left ventricular dilatation.
2. No evidence of aortic aneurysm or dissection.
3. Scattered tiny 2 and 3 mm nodular opacities in both upper lobes.
No follow-up needed if patient is low-risk (and has no known or
suspected primary neoplasm). Non-contrast chest CT can be considered
in 12 months if patient is high-risk. This recommendation follows
the consensus statement: Guidelines for Management of Incidental
Pulmonary Nodules Detected on CT Images:From the [HOSPITAL]
9645; published online before print (10.1148/radiol.0279767688).
4. Small hiatal hernia.

CTA ABD/PELVIS

1. No evidence of vascular aneurysm, dissection, significant
atherosclerotic plaque or stenosis.
2. Borderline splenomegaly, slightly progressed compared to
07/26/2015.
3. Small benign right renal angiomyolipoma measures less than 1 cm.
4. Scarring and residual small stones in the left kidney without
significant interval change.
5. IUD in place.

## 2019-10-03 ENCOUNTER — Ambulatory Visit (INDEPENDENT_AMBULATORY_CARE_PROVIDER_SITE_OTHER): Payer: Medicaid Other | Admitting: *Deleted

## 2019-10-03 ENCOUNTER — Other Ambulatory Visit: Payer: Self-pay

## 2019-10-03 DIAGNOSIS — Z954 Presence of other heart-valve replacement: Secondary | ICD-10-CM | POA: Diagnosis not present

## 2019-10-03 DIAGNOSIS — Z5181 Encounter for therapeutic drug level monitoring: Secondary | ICD-10-CM | POA: Diagnosis not present

## 2019-10-03 LAB — POCT INR: INR: 4.7 — AB (ref 2.0–3.0)

## 2019-10-03 NOTE — Patient Instructions (Signed)
Hold warfarin today then resume 2 tablets daily except 1 tablet every Friday Have heavy serving of vit k today Recheck in 2 weeks

## 2019-10-10 ENCOUNTER — Telehealth (INDEPENDENT_AMBULATORY_CARE_PROVIDER_SITE_OTHER): Payer: Medicaid Other | Admitting: Cardiology

## 2019-10-10 ENCOUNTER — Encounter: Payer: Self-pay | Admitting: Cardiology

## 2019-10-10 ENCOUNTER — Encounter: Payer: Self-pay | Admitting: *Deleted

## 2019-10-10 VITALS — Ht 68.0 in

## 2019-10-10 DIAGNOSIS — Z952 Presence of prosthetic heart valve: Secondary | ICD-10-CM | POA: Diagnosis not present

## 2019-10-10 DIAGNOSIS — I5032 Chronic diastolic (congestive) heart failure: Secondary | ICD-10-CM

## 2019-10-10 NOTE — Progress Notes (Signed)
Virtual Visit via Telephone Note   This visit type was conducted due to national recommendations for restrictions regarding the COVID-19 Pandemic (e.g. social distancing) in an effort to limit this patient's exposure and mitigate transmission in our community.  Due to her co-morbid illnesses, this patient is at least at moderate risk for complications without adequate follow up.  This format is felt to be most appropriate for this patient at this time.  The patient did not have access to video technology/had technical difficulties with video requiring transitioning to audio format only (telephone).  All issues noted in this document were discussed and addressed.  No physical exam could be performed with this format.  Please refer to the patient's chart for her  consent to telehealth for [facility].   The patient was identified using 2 identifiers.  Date:  10/10/2019   ID:  Gloria James, DOB 1976-02-23, MRN [id number]  Patient Location: Home Provider Location: Office  PCP:  Manon Hilding, MD  Cardiologist:  Carlyle Dolly, MD  Electrophysiologist:  None   Evaluation Performed:  Follow-Up Visit  Chief Complaint:  Follow up visit  History of Present Illness:    Gloria James is a 44 y.o. female seen today for follow up of the following medical problems.   1. Rheumatic MV disease -- 08/2017 echo [facility]: rheumatic MV described, severe MS. Mean gradient 15. MVA by PHT 1.6. HR 84 during study. - 10/2017 TEE moderate MS, severe MR - referred for surgery  - 12/22/17 had MVR with sorin carbomedical optiform bileaflet mechanical valve 31 mm - on coumadinand ASA  -02/2018 echo LVEF 50-55%, normal MVR function - not able to do cardiac rehab due to cost.    -Jan 2020 echo LVEF 50-55%, cannot eval diastolic function with valve repair, trivial MR, mean MV gradient 4 mmHg.   - fluid is up and down. Compliant with diuretic, takes extra torsemide as needed.     2.Chronic diasotlic HF - reports some mild weight gain at home. Some occasional LE edema, mainly feels abdomninal distension. Can have some SOB - has required and tolerated high doses of diuretic, has been on torsemide 80mg  bid.   - compliant with diuretic.    3. Pulmonary nodule - followed by pcp   4. Chest pain - soreness right side and heaviness and midchest.  - can occur at any time - not positional. Lasts 30-45 minutes. +SOB.  - some abdominal distension, some LE edema.    - some symptoms at times, can radiate into back - better with prednisone course by pcp recently     The patient does not have symptoms concerning for COVID-19 infection (fever, chills, cough, or new shortness of breath).    Past Medical History:  Diagnosis Date  . Acute pulmonary edema (HCC)   . Anemia   . CHF (congestive heart failure) ([place])   . Chronic diastolic (congestive) heart failure ([place])   . Depression   . Dyspnea   . Dysrhythmia    Patient states she would have episodes for tachycardia in the past  . History of kidney stones   . Panic disorder without agoraphobia   . PONV (postoperative nausea and vomiting)   . Precordial pain   . Rheumatic mitral stenosis with regurgitation   . 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 Tricuspid regurgitation  Past Surgical History:  Procedure Laterality Date  . APPENDECTOMY     age 11  . CARDIAC CATHETERIZATION  11/03/2017  . MITRAL VALVE REPAIR Right 12/22/2017   Procedure: MINIMALLY INVASIVE MITRAL VALVE REPLACEMENT(MVR);  Surgeon: Purcell Nails, MD;  Location: [facility] OR;  Service: Open Heart Surgery;  Laterality: Right;  . MULTIPLE EXTRACTIONS WITH ALVEOLOPLASTY N/A 12/06/2017   Procedure: Extraction of tooth #'s 4-6, 8-12, 20,22-29 and 32 with alveoloplasty;  Surgeon: Charlynne Pander, DDS;  Location: [facility] OR;  Service: Oral Surgery;  Laterality: N/A;  . MULTIPLE TOOTH EXTRACTIONS  2015  . OTHER SURGICAL HISTORY Left 2016   open surgery for kidney stones  . RIGHT AND LEFT HEART CATH N/A 11/03/2017   Procedure: RIGHT AND LEFT HEART CATH;  Surgeon: Kathleene Hazel, MD;  Location: [facility] INVASIVE CV LAB;  Service: Cardiovascular;  Laterality: N/A;  . TEE WITHOUT CARDIOVERSION N/A 11/03/2017   Procedure: TRANSESOPHAGEAL ECHOCARDIOGRAM (TEE);  Surgeon: Lewayne Bunting, MD;  Location: [facility] ENDOSCOPY;  Service: Cardiovascular;  Laterality: N/A;  . TEE WITHOUT CARDIOVERSION N/A 12/22/2017   Procedure: TRANSESOPHAGEAL ECHOCARDIOGRAM (TEE);  Surgeon: Purcell Nails, MD;  Location: [facility] OR;  Service: Open Heart Surgery;  Laterality: N/A;  . TONSILLECTOMY     6     Current Meds  Medication Sig  . acetaminophen (TYLENOL) 325 MG tablet Take 2 tablets (650 mg total) by mouth every 6 (six) hours as needed for mild pain.  Marland Kitchen albuterol (PROAIR HFA) 108 (90 Base) MCG/ACT inhaler Inhale 2 puffs into the lungs every 4 (four) hours as needed for wheezing or shortness of breath.   . ALPRAZolam (XANAX) 0.5 MG tablet Take 0.5 mg by mouth 4 (four) times daily as needed for anxiety.   Marland Kitchen aspirin EC 81 MG tablet Take 1 tablet (81 mg total) by mouth daily.  . fluticasone (FLONASE) 50 MCG/ACT nasal spray Place 2 sprays into both nostrils daily as needed for allergies.   . metoprolol tartrate (LOPRESSOR) 25 MG tablet Take 1 tablet (25 mg total) by mouth 2 (two) times daily.  . potassium chloride SA (K-DUR) 20 MEQ tablet TAKE 2 TABLETS BY MOUTH ONCE DAILY.  Marland Kitchen sertraline (ZOLOFT) 100 MG tablet Take 250 mg by mouth daily.   Marland Kitchen torsemide (DEMADEX) 20 MG tablet TAKE 4 TABLETS TWICE DAILY.  . traZODone (DESYREL) 100 MG tablet Take 200 mg by mouth at bedtime.   Marland Kitchen warfarin (COUMADIN) 2 MG tablet TAKE 2 TABLETS ONCE A DAY  OR AS DIRECTED BY COUMADIN CLINIC.     Allergies:   Patient has no known  allergies.   Social History   Tobacco Use  . Smoking status: Former Smoker    Packs/day: 0.50    Years: 26.00    Pack years: 13.00    Types: Cigarettes    Start date: 12/18/1991    Quit date: 09/23/2017    Years since quitting: 2.0  . Smokeless tobacco: Never Used  Substance Use Topics  . Alcohol use: Not Currently  . Drug use: Never     Family Hx: The patient's family history includes Cancer in her father, paternal grandfather, and paternal grandmother; Dementia in her maternal grandmother; Heart disease in her maternal grandfather.  ROS:   Please see the history of present illness.     All other systems reviewed and are negative.   Prior CV studies:   The following studies were reviewed today:  10/2017 echo Study Conclusions  - Left ventricle: Systolic function  was normal. The estimated ejection fraction was in the range of 55% to 60%. Wall motion was normal; there were no regional wall motion abnormalities. - Aortic valve: There was trivial regurgitation. - Mitral valve: Mild thickening, consistent with rheumatic disease. Mobility of the posterior leaflet was severely restricted. The findings are consistent with moderate stenosis. There was severe regurgitation. Valve area by pressure half-time: 1.76 cm^2. - Left atrium: The atrium was severely dilated. No evidence of thrombus in the atrial cavity or appendage. - Atrial septum: No defect or patent foramen ovale was identified. - Tricuspid valve: No evidence of vegetation. There was mild-moderate regurgitation. - Pulmonic valve: No evidence of vegetation.  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   10/2017 cath 1. NO angiographic evidence of CAD 2. Large v-wave on wedge tracing c/w severe MR  Recommendations: Continue workup for surgical MV replacement/repair. I do not think she will be a candidate for balloon valvuloplasty given her severe MR.   02/2018 echo Study Conclusions  - Left ventricle: The cavity size was normal. Systolic function was normal. The estimated ejection fraction was in the range of 50% to 55%. Wall motion was normal; there were no regional wall motion abnormalities. Features are consistent with a pseudonormal left ventricular filling pattern, with concomitant abnormal relaxation and increased filling pressure (grade 2 diastolic dysfunction). Doppler parameters are consistent with high ventricular filling pressure. - Mitral valve: A mechanical prosthesis was present and functioning normally. Mean gradient (D): 4 mm Hg. Valve area by pressure half-time: 2.93 cm^2. Valve area by continuity equation (using LVOT flow): 2.1 cm^2. - Left atrium: The atrium was mildly dilated. - Tricuspid valve: There was mild regurgitation. - Pulmonic valve: There was trivial regurgitation.  Impressions:  - Low normal LVF with EF 50-55%, mild TR, stable mechanical MVR well seated with no perivalvular MR. The mean MVG is stable at and MVA 2.93cm2 by PHT and 2.1cm2 by continuity equation. Since last echo, mechanical MVR is new.   Jan 2020 echo IMPRESSIONS   1. The left ventricle has The cavity size is normal. There is no left ventricular wall thickness. Indeterminate diastolic filling patterns. The left ventricular diastology could not be evaluated due to mitral valve replacement/repair. 2. A 31 Sorin Carbomedics mechanical valve is present in the mitral position. 3. Normal tricuspid valve. 4. The aortic valve tricuspid. There ismild calcification of the aortic valve. 5. The aortic root is  normal is size and structure. 6. Normal right atrial size. 7. Upper normal left atrial size. 8. The interatrial septum appears to be lipomatous. 9. No atrial level shunt detected by color flow Doppler. 10. The mitral valve has been replaced. Regurgitation is trivial by color flow Doppler.   Labs/Other Tests and Data Reviewed:    EKG:  No ECG reviewed.  Recent Labs: No results found for requested labs within last 8760 hours.   Recent Lipid Panel No results found for: CHOL, TRIG, HDL, CHOLHDL, LDLCALC, LDLDIRECT  Wt Readings from Last 3 Encounters:  06/11/19 247 lb 6.4 oz (112.2 kg)  02/05/19 244 lb 6.4 oz (110.9 kg)  12/25/18 256 lb (116.1 kg)     Objective:    Vital Signs:  Ht 5\' 8"  (1.727 m)  BMI 37.62 kg/m    Normal affect. Normal speech pattern and tone. Comfortable, no apparent distress. No audible signs of sob or wheezing.   ASSESSMENT & PLAN:    1. Mitral valve replacement/Mitral stenosis -last echo shows normal LV function, normal MVR - continue coumadin and ASA in setting of mecahnical valve. - continue current therapy, no changes   2.Chronic diastolic HF -has required and tolerated high doses of torsemide - continue current regimen, request most recent labs from pcp    COVID-19 Education: The signs and symptoms of COVID-19 were discussed with the patient and how to seek care for testing (follow up with PCP or arrange E-visit).  The importance of social distancing was discussed today.  Time:   Today, I have spent 21 minutes with the patient with telehealth technology discussing the above problems.     Medication Adjustments/Labs and Tests Ordered: Current medicines are reviewed at length with the patient today.  Concerns regarding medicines are outlined above.   Tests Ordered: No orders of the defined types were placed in this encounter.   Medication Changes: No orders of the defined types were placed in this encounter.   Follow Up:   Either In Person or Virtual in 6 month(s)  Signed, Dina Rich, MD  10/10/2019 8:40 AM

## 2019-10-10 NOTE — Patient Instructions (Signed)

## 2019-10-22 ENCOUNTER — Ambulatory Visit (INDEPENDENT_AMBULATORY_CARE_PROVIDER_SITE_OTHER): Payer: Medicaid Other | Admitting: *Deleted

## 2019-10-22 ENCOUNTER — Other Ambulatory Visit: Payer: Self-pay

## 2019-10-22 DIAGNOSIS — Z954 Presence of other heart-valve replacement: Secondary | ICD-10-CM | POA: Diagnosis not present

## 2019-10-22 DIAGNOSIS — Z5181 Encounter for therapeutic drug level monitoring: Secondary | ICD-10-CM | POA: Diagnosis not present

## 2019-10-22 LAB — POCT INR: INR: 2.9 (ref 2.0–3.0)

## 2019-10-22 NOTE — Patient Instructions (Signed)
Continue warfarin 2 tablets daily except 1 tablet every Friday Keep Vit K consistant Recheck in 4 weeks

## 2019-11-19 ENCOUNTER — Ambulatory Visit (INDEPENDENT_AMBULATORY_CARE_PROVIDER_SITE_OTHER): Payer: Medicaid Other | Admitting: *Deleted

## 2019-11-19 ENCOUNTER — Other Ambulatory Visit: Payer: Self-pay

## 2019-11-19 DIAGNOSIS — Z954 Presence of other heart-valve replacement: Secondary | ICD-10-CM

## 2019-11-19 DIAGNOSIS — Z5181 Encounter for therapeutic drug level monitoring: Secondary | ICD-10-CM | POA: Diagnosis not present

## 2019-11-19 LAB — POCT INR: INR: 3.8 — AB (ref 2.0–3.0)

## 2019-11-19 NOTE — Patient Instructions (Signed)
Take warfarin 1 tablet today then resume 2 tablets daily except 1 tablet every Friday Keep Vit K consistant Recheck in 4 weeks

## 2019-12-17 ENCOUNTER — Other Ambulatory Visit: Payer: Self-pay

## 2019-12-17 ENCOUNTER — Ambulatory Visit (INDEPENDENT_AMBULATORY_CARE_PROVIDER_SITE_OTHER): Payer: Medicaid Other | Admitting: *Deleted

## 2019-12-17 DIAGNOSIS — Z954 Presence of other heart-valve replacement: Secondary | ICD-10-CM

## 2019-12-17 DIAGNOSIS — Z5181 Encounter for therapeutic drug level monitoring: Secondary | ICD-10-CM

## 2019-12-17 LAB — POCT INR: INR: 3.7 — AB (ref 2.0–3.0)

## 2019-12-17 NOTE — Patient Instructions (Signed)
Decrease warfarin to 2 tablets daily except 1 tablet every Mondays and Fridays Keep Vit K consistant Recheck in 4 weeks

## 2020-01-17 ENCOUNTER — Ambulatory Visit (INDEPENDENT_AMBULATORY_CARE_PROVIDER_SITE_OTHER): Payer: Medicaid Other | Admitting: *Deleted

## 2020-01-17 DIAGNOSIS — Z5181 Encounter for therapeutic drug level monitoring: Secondary | ICD-10-CM | POA: Diagnosis not present

## 2020-01-17 DIAGNOSIS — Z954 Presence of other heart-valve replacement: Secondary | ICD-10-CM | POA: Diagnosis not present

## 2020-01-17 LAB — POCT INR: INR: 4.3 — AB (ref 2.0–3.0)

## 2020-01-17 NOTE — Patient Instructions (Signed)
Hold warfarin tonight then decrease dose to 2 tablets daily except 1 tablet every Mondays, Wednesdays and Fridays Keep Vit K consistant Recheck in 3 weeks

## 2020-02-06 ENCOUNTER — Ambulatory Visit (INDEPENDENT_AMBULATORY_CARE_PROVIDER_SITE_OTHER): Payer: Medicaid Other | Admitting: *Deleted

## 2020-02-06 ENCOUNTER — Other Ambulatory Visit: Payer: Self-pay

## 2020-02-06 DIAGNOSIS — Z5181 Encounter for therapeutic drug level monitoring: Secondary | ICD-10-CM

## 2020-02-06 DIAGNOSIS — Z954 Presence of other heart-valve replacement: Secondary | ICD-10-CM

## 2020-02-06 LAB — POCT INR: INR: 2.2 (ref 2.0–3.0)

## 2020-02-06 NOTE — Patient Instructions (Signed)
Increase dose to 2 tablets daily except 1 tablet on Mondays and Fridays Keep Vit K consistant Recheck in 3 weeks

## 2020-02-26 ENCOUNTER — Telehealth: Payer: Self-pay | Admitting: *Deleted

## 2020-02-26 NOTE — Telephone Encounter (Signed)
Spoke with patient.  She was started on Augmentin bid x 10 days and Prednisone 20mg  taper (2 tabs x 3 days, then 1 tab x 4 days) on 8/30 by [facility].  Reviewed INR record.  Pt has already taken warfarin today.  Told pt to decrease dose to 4mg  daily except 2mg  on Monday, Wednesday and Friday until finished with Abx/prednisone then resume 4mg  daily except 2mg  on Monday and Fridays.  INR check on 9/17  Pt verbalized understanding.

## 2020-02-26 NOTE — Telephone Encounter (Signed)
Contacted office to inform of dx of pneumonia yesterday by PCP and starting augmentin and prednisone on yesterday.

## 2020-03-14 ENCOUNTER — Ambulatory Visit (INDEPENDENT_AMBULATORY_CARE_PROVIDER_SITE_OTHER): Payer: Medicaid Other | Admitting: *Deleted

## 2020-03-14 DIAGNOSIS — Z5181 Encounter for therapeutic drug level monitoring: Secondary | ICD-10-CM | POA: Diagnosis not present

## 2020-03-14 DIAGNOSIS — Z954 Presence of other heart-valve replacement: Secondary | ICD-10-CM | POA: Diagnosis not present

## 2020-03-14 LAB — POCT INR: INR: 2.8 (ref 2.0–3.0)

## 2020-03-14 NOTE — Patient Instructions (Signed)
Continue warfarin 2 tablets daily except 1 tablet on Mondays and Fridays Keep Vit K consistant Recheck in 4 weeks 

## 2020-04-10 ENCOUNTER — Encounter: Payer: Self-pay | Admitting: Cardiology

## 2020-04-10 ENCOUNTER — Ambulatory Visit (INDEPENDENT_AMBULATORY_CARE_PROVIDER_SITE_OTHER): Payer: Medicaid Other | Admitting: Cardiology

## 2020-04-10 ENCOUNTER — Ambulatory Visit (INDEPENDENT_AMBULATORY_CARE_PROVIDER_SITE_OTHER): Payer: Medicaid Other | Admitting: *Deleted

## 2020-04-10 ENCOUNTER — Other Ambulatory Visit: Payer: Self-pay

## 2020-04-10 VITALS — BP 128/82 | HR 90 | Ht 68.0 in | Wt 237.0 lb

## 2020-04-10 DIAGNOSIS — Z23 Encounter for immunization: Secondary | ICD-10-CM

## 2020-04-10 DIAGNOSIS — Z954 Presence of other heart-valve replacement: Secondary | ICD-10-CM

## 2020-04-10 DIAGNOSIS — I5032 Chronic diastolic (congestive) heart failure: Secondary | ICD-10-CM | POA: Diagnosis not present

## 2020-04-10 DIAGNOSIS — Z5181 Encounter for therapeutic drug level monitoring: Secondary | ICD-10-CM | POA: Diagnosis not present

## 2020-04-10 LAB — POCT INR: INR: 3.8 — AB (ref 2.0–3.0)

## 2020-04-10 NOTE — Patient Instructions (Signed)
Take warfarin 1 tablet tonight then resume 2 tablets daily except 1 tablet on Mondays and Fridays Keep Vit K consistant Recheck in 4 weeks

## 2020-04-10 NOTE — Progress Notes (Signed)
Clinical Summary Ms. Santmyer is a 44 y.o.female seen today for follow up of the following medical problems.  1. Rheumatic MV disease -- 08/2017 echo [facility]: rheumatic MV described, severe MS. Mean gradient 15. MVA by PHT 1.6. HR 84 during study. - 10/2017 TEE moderate MS, severe MR - referred for surgery  - 12/22/17 had MVR with sorin carbomedical optiform bileaflet mechanical valve 31 mm - on coumadinand ASA  -02/2018 echo LVEF 50-55%, normal MVR function - not able to do cardiac rehab due to cost.    -Jan 2020 echo LVEF 50-55%, cannot eval diastolic function with valve repair, trivial MR, mean MV gradient 4 mmHg.   - some swellng at times, overall controlled with torsemide.  - some SOB at times, often comes on with allergies.  - no bleeding on coumadin  2.Chronic diasotlic HF -reports some mild weight gain at home. Some occasional LE edema, mainly feels abdomninal distension. Can have some SOB - has required and tolerated high doses of diuretic, has been on torsemide 80mg  bid.  - swelling has been controlled   3. Pulmonary nodule - followed by pcp       Had covid vaccine x 2 moderna     Past Medical History:  Diagnosis Date  . Acute pulmonary edema (HCC)   . Anemia   . CHF (congestive heart failure) (HCC)   . Chronic diastolic (congestive) heart failure (HCC)   . Depression   . Dyspnea   . Dysrhythmia    Patient states she would have episodes for tachycardia in the past  . History of kidney stones   . Panic disorder without agoraphobia   . PONV (postoperative nausea and vomiting)   . Precordial pain   . Rheumatic mitral stenosis with regurgitation   . 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 Tricuspid regurgitation      No Known Allergies   Current Outpatient Medications   Medication Sig Dispense Refill  . acetaminophen (TYLENOL) 325 MG tablet Take 2 tablets (650 mg total) by mouth every 6 (six) hours as needed for mild pain.    12/24/2017 albuterol (PROAIR HFA) 108 (90 Base) MCG/ACT inhaler Inhale 2 puffs into the lungs every 4 (four) hours as needed for wheezing or shortness of breath.     . ALPRAZolam (XANAX) 0.5 MG tablet Take 0.5 mg by mouth 4 (four) times daily as needed for anxiety.     Marland Kitchen aspirin EC 81 MG tablet Take 1 tablet (81 mg total) by mouth daily. 90 tablet 3  . fluticasone (FLONASE) 50 MCG/ACT nasal spray Place 2 sprays into both nostrils daily as needed for allergies.     . metoprolol tartrate (LOPRESSOR) 25 MG tablet Take 1 tablet (25 mg total) by mouth 2 (two) times daily. 180 tablet 1  . potassium chloride SA (K-DUR) 20 MEQ tablet TAKE 2 TABLETS BY MOUTH ONCE DAILY. 60 tablet 6  . sertraline (ZOLOFT) 100 MG tablet Take 250 mg by mouth daily.     Marland Kitchen torsemide (DEMADEX) 20 MG tablet TAKE 4 TABLETS TWICE DAILY. 240 tablet 6  . traZODone (DESYREL) 100 MG tablet Take 200 mg by mouth at bedtime.     Marland Kitchen warfarin (COUMADIN) 2 MG tablet TAKE 2 TABLETS ONCE A DAY  OR AS DIRECTED BY COUMADIN CLINIC. 65 tablet 6   No current facility-administered medications for this  visit.     Past Surgical History:  Procedure Laterality Date  . APPENDECTOMY     age 81  . CARDIAC CATHETERIZATION  11/03/2017  . MITRAL VALVE REPAIR Right 12/22/2017   Procedure: MINIMALLY INVASIVE MITRAL VALVE REPLACEMENT(MVR);  Surgeon: Purcell Nails, MD;  Location: [facility] OR;  Service: Open Heart Surgery;  Laterality: Right;  . MULTIPLE EXTRACTIONS WITH ALVEOLOPLASTY N/A 12/06/2017   Procedure: Extraction of tooth #'s 4-6, 8-12, 20,22-29 and 32 with alveoloplasty;  Surgeon: Charlynne Pander, DDS;  Location: [facility] OR;  Service: Oral Surgery;  Laterality: N/A;  . MULTIPLE TOOTH EXTRACTIONS  2015  . OTHER SURGICAL HISTORY Left 2016   open surgery for kidney stones  . RIGHT AND LEFT HEART CATH N/A  11/03/2017   Procedure: RIGHT AND LEFT HEART CATH;  Surgeon: Kathleene Hazel, MD;  Location: [facility] INVASIVE CV LAB;  Service: Cardiovascular;  Laterality: N/A;  . TEE WITHOUT CARDIOVERSION N/A 11/03/2017   Procedure: TRANSESOPHAGEAL ECHOCARDIOGRAM (TEE);  Surgeon: Lewayne Bunting, MD;  Location: [facility] ENDOSCOPY;  Service: Cardiovascular;  Laterality: N/A;  . TEE WITHOUT CARDIOVERSION N/A 12/22/2017   Procedure: TRANSESOPHAGEAL ECHOCARDIOGRAM (TEE);  Surgeon: Purcell Nails, MD;  Location: [facility] OR;  Service: Open Heart Surgery;  Laterality: N/A;  . TONSILLECTOMY     6     No Known Allergies    Family History  Problem Relation Age of Onset  . Cancer Father   . Dementia Maternal Grandmother   . Heart disease Maternal Grandfather        had open heart problems  . Cancer Paternal Grandmother   . Cancer Paternal Grandfather      Social History Ms. Hussey reports that she quit smoking about 2 years ago. Her smoking use included cigarettes. She started smoking about 28 years ago. She has a 13.00 pack-year smoking history. She has never used smokeless tobacco. Ms. Yera reports previous alcohol use.   Review of Systems CONSTITUTIONAL: No weight loss, fever, chills, weakness or fatigue.  HEENT: Eyes: No visual loss, blurred vision, double vision or yellow sclerae.No hearing loss, sneezing, congestion, runny nose or sore throat.  SKIN: No rash or itching.  CARDIOVASCULAR: per hpi RESPIRATORY: No shortness of breath, cough or sputum.  GASTROINTESTINAL: No anorexia, nausea, vomiting or diarrhea. No abdominal pain or blood.  GENITOURINARY: No burning on urination, no polyuria NEUROLOGICAL: No headache, dizziness, syncope, paralysis, ataxia, numbness or tingling in the extremities. No change in bowel or bladder control.  MUSCULOSKELETAL: No muscle, back pain, joint pain or stiffness.  LYMPHATICS: No enlarged nodes. No history of splenectomy.  PSYCHIATRIC: No history of depression or  anxiety.  ENDOCRINOLOGIC: No reports of sweating, cold or heat intolerance. No polyuria or polydipsia.  Marland Kitchen   Physical Examination Today's Vitals   04/10/20 1332  BP: 128/82  Pulse: 90  SpO2: 96%  Weight: 237 lb (107.5 kg)  Height: 5\' 8"  (1.727 m)   Body mass index is 36.04 kg/m.  Gen: resting comfortably, no acute distress HEENT: no scleral icterus, pupils equal round and reactive, no palptable cervical adenopathy,  CV: RRR, no m/r/g, no jvd Resp: Clear to auscultation bilaterally GI: abdomen is soft, non-tender, non-distended, normal bowel sounds, no hepatosplenomegaly MSK: extremities are warm, no edema.  Skin: warm, no rash Neuro:  no focal deficits Psych: appropriate affect   Diagnostic Studies 10/2017 echo Study Conclusions  - Left ventricle: Systolic function was normal. The estimated ejection fraction was in the range of 55% to 60%. Wall motion  was normal; there were no regional wall motion abnormalities. - Aortic valve: There was trivial regurgitation. - Mitral valve: Mild thickening, consistent with rheumatic disease. Mobility of the posterior leaflet was severely restricted. The findings are consistent with moderate stenosis. There was severe regurgitation. Valve area by pressure half-time: 1.76 cm^2. - Left atrium: The atrium was severely dilated. No evidence of thrombus in the atrial cavity or appendage. - Atrial septum: No defect or patent foramen ovale was identified. - Tricuspid valve: No evidence of vegetation. There was mild-moderate regurgitation. - Pulmonic valve: No evidence of vegetation.  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   10/2017 cath 1. NO angiographic evidence of CAD 2. Large v-wave on wedge tracing c/w severe MR  Recommendations: Continue workup for surgical MV replacement/repair. I do not think she will be a candidate for balloon valvuloplasty given her severe MR.   02/2018 echo Study Conclusions  - Left ventricle: The cavity size was normal. Systolic function was normal. The estimated ejection fraction was in the range of 50% to 55%. Wall motion was normal; there were no regional wall motion abnormalities. Features are consistent with a pseudonormal left ventricular filling pattern, with concomitant abnormal relaxation and increased filling pressure (grade 2 diastolic dysfunction). Doppler parameters are consistent with high ventricular filling pressure. - Mitral valve: A mechanical prosthesis was present and functioning normally. Mean gradient (D): 4 mm Hg. Valve area by pressure half-time: 2.93 cm^2. Valve area by continuity equation (using LVOT flow): 2.1 cm^2. - Left atrium: The atrium was mildly dilated. - Tricuspid valve: There was mild regurgitation. - Pulmonic valve: There was trivial regurgitation.  Impressions:  - Low normal LVF with EF 50-55%, mild TR, stable mechanical MVR well seated with no perivalvular MR. The mean MVG is stable at and MVA 2.93cm2 by PHT and 2.1cm2 by continuity equation. Since last echo, mechanical MVR is new.   Jan 2020 echo IMPRESSIONS   1. The left ventricle has The cavity size is normal. There is no left ventricular wall thickness. Indeterminate diastolic filling patterns. The left ventricular diastology could not be evaluated due to mitral valve replacement/repair. 2. A 31 Sorin Carbomedics mechanical valve is present in the mitral position. 3. Normal tricuspid valve. 4. The aortic valve tricuspid. There ismild calcification of the aortic valve. 5. The aortic  root is normal is size and structure. 6. Normal right atrial size. 7. Upper normal left atrial size. 8. The interatrial septum appears to be lipomatous. 9. No atrial level shunt detected by color flow Doppler. 10. The mitral valve has been replaced. Regurgitation is trivial by color flow Doppler.    Assessment and Plan   1. Mitral valve replacement/Mitral stenosis -lastecho shows normal LV function, normal MVR - no recent symptoms, continue current meds.    2.Chronic diastolic HF -has required and tolerated high doses of torsemide - no recent issues with edema, continue current diuretic.   F/u 1 year   Antoine Poche, M.D.

## 2020-04-10 NOTE — Patient Instructions (Signed)

## 2020-05-12 ENCOUNTER — Ambulatory Visit (INDEPENDENT_AMBULATORY_CARE_PROVIDER_SITE_OTHER): Payer: Medicaid Other | Admitting: *Deleted

## 2020-05-12 DIAGNOSIS — Z954 Presence of other heart-valve replacement: Secondary | ICD-10-CM

## 2020-05-12 DIAGNOSIS — Z5181 Encounter for therapeutic drug level monitoring: Secondary | ICD-10-CM

## 2020-05-12 LAB — POCT INR: INR: 2.4 (ref 2.0–3.0)

## 2020-05-12 NOTE — Patient Instructions (Signed)
Take warfarin 2 tablets tonight then resume 2 tablets daily except 1 tablet on Mondays and Fridays Keep Vit K consistant Recheck in 4 weeks

## 2020-05-19 ENCOUNTER — Other Ambulatory Visit: Payer: Self-pay | Admitting: Cardiology

## 2020-05-19 DIAGNOSIS — Z5181 Encounter for therapeutic drug level monitoring: Secondary | ICD-10-CM

## 2020-06-09 ENCOUNTER — Ambulatory Visit (INDEPENDENT_AMBULATORY_CARE_PROVIDER_SITE_OTHER): Payer: Medicaid Other | Admitting: *Deleted

## 2020-06-09 ENCOUNTER — Other Ambulatory Visit: Payer: Self-pay

## 2020-06-09 DIAGNOSIS — Z5181 Encounter for therapeutic drug level monitoring: Secondary | ICD-10-CM | POA: Diagnosis not present

## 2020-06-09 DIAGNOSIS — Z954 Presence of other heart-valve replacement: Secondary | ICD-10-CM

## 2020-06-09 LAB — POCT INR: INR: 3.2 — AB (ref 2.0–3.0)

## 2020-06-09 NOTE — Patient Instructions (Signed)
Continue warfarin 2 tablets daily except 1 tablet on Mondays and Fridays Keep Vit K consistant Recheck in 4 weeks 

## 2020-07-07 ENCOUNTER — Ambulatory Visit (INDEPENDENT_AMBULATORY_CARE_PROVIDER_SITE_OTHER): Payer: Medicaid Other | Admitting: *Deleted

## 2020-07-07 DIAGNOSIS — Z954 Presence of other heart-valve replacement: Secondary | ICD-10-CM | POA: Diagnosis not present

## 2020-07-07 DIAGNOSIS — Z5181 Encounter for therapeutic drug level monitoring: Secondary | ICD-10-CM

## 2020-07-07 LAB — POCT INR: INR: 1.5 — AB (ref 2.0–3.0)

## 2020-07-07 NOTE — Patient Instructions (Signed)
Take warfarin 3 tablets today and tomorrow then continue 2 tablets daily except 1 tablet on Mondays and Fridays Keep Vit K consistant Recheck in 1 week

## 2020-07-17 ENCOUNTER — Ambulatory Visit (INDEPENDENT_AMBULATORY_CARE_PROVIDER_SITE_OTHER): Payer: Medicaid Other | Admitting: *Deleted

## 2020-07-17 DIAGNOSIS — Z5181 Encounter for therapeutic drug level monitoring: Secondary | ICD-10-CM | POA: Diagnosis not present

## 2020-07-17 DIAGNOSIS — Z954 Presence of other heart-valve replacement: Secondary | ICD-10-CM | POA: Diagnosis not present

## 2020-07-17 LAB — POCT INR: INR: 3.7 — AB (ref 2.0–3.0)

## 2020-07-17 NOTE — Patient Instructions (Signed)
Take warfarin 1 tablet today then continue 2 tablets daily except 1 tablet on Mondays and Fridays Keep Vit K consistant Recheck in 3 weeks

## 2020-08-05 ENCOUNTER — Other Ambulatory Visit: Payer: Self-pay | Admitting: Cardiology

## 2020-08-07 ENCOUNTER — Ambulatory Visit (INDEPENDENT_AMBULATORY_CARE_PROVIDER_SITE_OTHER): Payer: Medicaid Other | Admitting: *Deleted

## 2020-08-07 DIAGNOSIS — Z954 Presence of other heart-valve replacement: Secondary | ICD-10-CM | POA: Diagnosis not present

## 2020-08-07 DIAGNOSIS — Z5181 Encounter for therapeutic drug level monitoring: Secondary | ICD-10-CM

## 2020-08-07 LAB — POCT INR: INR: 2.5 (ref 2.0–3.0)

## 2020-08-07 NOTE — Patient Instructions (Signed)
Take warfarin 1 tablet today then continue 2 tablets daily except 1 tablet on Mondays and Fridays Keep Vit K consistant Recheck in 4 weeks

## 2020-09-04 ENCOUNTER — Ambulatory Visit (INDEPENDENT_AMBULATORY_CARE_PROVIDER_SITE_OTHER): Payer: Medicaid Other | Admitting: *Deleted

## 2020-09-04 DIAGNOSIS — Z5181 Encounter for therapeutic drug level monitoring: Secondary | ICD-10-CM | POA: Diagnosis not present

## 2020-09-04 DIAGNOSIS — Z954 Presence of other heart-valve replacement: Secondary | ICD-10-CM

## 2020-09-04 LAB — POCT INR: INR: 3.3 — AB (ref 2.0–3.0)

## 2020-09-04 NOTE — Patient Instructions (Signed)
Continue warfarin 2 tablets daily except 1 tablet on Mondays and Fridays Keep Vit K consistant Recheck in 4 weeks

## 2020-10-02 ENCOUNTER — Other Ambulatory Visit: Payer: Self-pay

## 2020-10-02 ENCOUNTER — Ambulatory Visit (INDEPENDENT_AMBULATORY_CARE_PROVIDER_SITE_OTHER): Payer: Medicaid Other | Admitting: *Deleted

## 2020-10-02 DIAGNOSIS — Z5181 Encounter for therapeutic drug level monitoring: Secondary | ICD-10-CM | POA: Diagnosis not present

## 2020-10-02 DIAGNOSIS — Z954 Presence of other heart-valve replacement: Secondary | ICD-10-CM

## 2020-10-02 LAB — POCT INR: INR: 2.9 (ref 2.0–3.0)

## 2020-10-02 NOTE — Patient Instructions (Signed)
Continue warfarin 2 tablets daily except 1 tablet on Mondays and Fridays Keep Vit K consistant Recheck in 6 weeks

## 2020-10-15 ENCOUNTER — Other Ambulatory Visit: Payer: Self-pay | Admitting: Cardiology

## 2020-11-13 ENCOUNTER — Ambulatory Visit (INDEPENDENT_AMBULATORY_CARE_PROVIDER_SITE_OTHER): Payer: Medicaid Other | Admitting: *Deleted

## 2020-11-13 DIAGNOSIS — Z954 Presence of other heart-valve replacement: Secondary | ICD-10-CM | POA: Diagnosis not present

## 2020-11-13 DIAGNOSIS — Z5181 Encounter for therapeutic drug level monitoring: Secondary | ICD-10-CM | POA: Diagnosis not present

## 2020-11-13 LAB — POCT INR: INR: 5 — AB (ref 2.0–3.0)

## 2020-11-13 NOTE — Patient Instructions (Signed)
Hold warfarin tonight and tomorrow night then resume 2 tablets daily except 1 tablet on Mondays and Fridays Keep Vit K consistant Recheck in 2 weeks

## 2020-11-27 ENCOUNTER — Ambulatory Visit (INDEPENDENT_AMBULATORY_CARE_PROVIDER_SITE_OTHER): Payer: Medicaid Other | Admitting: *Deleted

## 2020-11-27 DIAGNOSIS — Z954 Presence of other heart-valve replacement: Secondary | ICD-10-CM | POA: Diagnosis not present

## 2020-11-27 DIAGNOSIS — Z5181 Encounter for therapeutic drug level monitoring: Secondary | ICD-10-CM

## 2020-11-27 LAB — POCT INR: INR: 4.2 — AB (ref 2.0–3.0)

## 2020-11-27 NOTE — Patient Instructions (Signed)
Hold warfarin tonight then decrease dose to 2 tablets daily except 1 tablet on Mondays, Wednesdays and Fridays Keep Vit K consistant Recheck in 3 weeks

## 2020-12-22 ENCOUNTER — Ambulatory Visit (INDEPENDENT_AMBULATORY_CARE_PROVIDER_SITE_OTHER): Payer: Medicaid Other | Admitting: *Deleted

## 2020-12-22 ENCOUNTER — Other Ambulatory Visit: Payer: Self-pay

## 2020-12-22 DIAGNOSIS — Z5181 Encounter for therapeutic drug level monitoring: Secondary | ICD-10-CM | POA: Diagnosis not present

## 2020-12-22 DIAGNOSIS — Z954 Presence of other heart-valve replacement: Secondary | ICD-10-CM

## 2020-12-22 LAB — POCT INR: INR: 3.5 — AB (ref 2.0–3.0)

## 2020-12-22 NOTE — Patient Instructions (Signed)
Continue warfarin 2 tablets daily except 1 tablet on Mondays, Wednesdays and Fridays Keep Vit K consistant Recheck in 4 weeks

## 2021-01-19 ENCOUNTER — Other Ambulatory Visit: Payer: Self-pay

## 2021-01-19 ENCOUNTER — Ambulatory Visit (INDEPENDENT_AMBULATORY_CARE_PROVIDER_SITE_OTHER): Payer: Medicaid Other | Admitting: *Deleted

## 2021-01-19 DIAGNOSIS — Z5181 Encounter for therapeutic drug level monitoring: Secondary | ICD-10-CM

## 2021-01-19 DIAGNOSIS — Z954 Presence of other heart-valve replacement: Secondary | ICD-10-CM | POA: Diagnosis not present

## 2021-01-19 LAB — POCT INR: INR: 1.9 — AB (ref 2.0–3.0)

## 2021-01-19 NOTE — Patient Instructions (Signed)
Take warfarin 2 extra tablets today then resume warfarin 2 tablets daily except 1 tablet on Mondays, Wednesdays and Fridays Keep Vit K consistant Recheck in 2 weeks

## 2021-02-03 ENCOUNTER — Ambulatory Visit (INDEPENDENT_AMBULATORY_CARE_PROVIDER_SITE_OTHER): Payer: Medicaid Other | Admitting: *Deleted

## 2021-02-03 DIAGNOSIS — Z5181 Encounter for therapeutic drug level monitoring: Secondary | ICD-10-CM | POA: Diagnosis not present

## 2021-02-03 DIAGNOSIS — Z954 Presence of other heart-valve replacement: Secondary | ICD-10-CM

## 2021-02-03 LAB — POCT INR: INR: 1.9 — AB (ref 2.0–3.0)

## 2021-02-03 NOTE — Patient Instructions (Signed)
Increase warfarin to 2 tablets daily except 1 tablet on Mondays Keep Vit K consistant Recheck in 2 weeks

## 2021-02-17 ENCOUNTER — Ambulatory Visit (INDEPENDENT_AMBULATORY_CARE_PROVIDER_SITE_OTHER): Payer: Medicaid Other | Admitting: *Deleted

## 2021-02-17 DIAGNOSIS — Z954 Presence of other heart-valve replacement: Secondary | ICD-10-CM | POA: Diagnosis not present

## 2021-02-17 DIAGNOSIS — Z5181 Encounter for therapeutic drug level monitoring: Secondary | ICD-10-CM | POA: Diagnosis not present

## 2021-02-17 LAB — POCT INR: INR: 3.9 — AB (ref 2.0–3.0)

## 2021-02-17 NOTE — Patient Instructions (Signed)
Hold warfarin tonight then decrease dose to 2 tablets daily except 1 tablet on Mondays and Thursdays Keep Vit K consistant Recheck in 3 weeks

## 2021-03-11 ENCOUNTER — Ambulatory Visit (INDEPENDENT_AMBULATORY_CARE_PROVIDER_SITE_OTHER): Payer: Medicaid Other | Admitting: *Deleted

## 2021-03-11 ENCOUNTER — Other Ambulatory Visit: Payer: Self-pay

## 2021-03-11 DIAGNOSIS — Z954 Presence of other heart-valve replacement: Secondary | ICD-10-CM

## 2021-03-11 DIAGNOSIS — Z5181 Encounter for therapeutic drug level monitoring: Secondary | ICD-10-CM

## 2021-03-11 LAB — POCT INR: INR: 4.2 — AB (ref 2.0–3.0)

## 2021-03-11 NOTE — Patient Instructions (Signed)
Hold warfarin tonight then resume 2 tablets daily except 1 tablet on Mondays and Thursdays Keep Vit K consistant Recheck in 3 weeks

## 2021-03-16 ENCOUNTER — Other Ambulatory Visit: Payer: Self-pay | Admitting: Cardiology

## 2021-03-16 DIAGNOSIS — Z5181 Encounter for therapeutic drug level monitoring: Secondary | ICD-10-CM

## 2021-04-01 ENCOUNTER — Ambulatory Visit (INDEPENDENT_AMBULATORY_CARE_PROVIDER_SITE_OTHER): Payer: Medicaid Other | Admitting: *Deleted

## 2021-04-01 DIAGNOSIS — Z5181 Encounter for therapeutic drug level monitoring: Secondary | ICD-10-CM | POA: Diagnosis not present

## 2021-04-01 DIAGNOSIS — Z954 Presence of other heart-valve replacement: Secondary | ICD-10-CM | POA: Diagnosis not present

## 2021-04-01 LAB — POCT INR: INR: 3.4 — AB (ref 2.0–3.0)

## 2021-04-01 NOTE — Patient Instructions (Signed)
Continue warfarin 2 tablets daily except 1 tablet on Mondays and Thursdays Keep Vit K consistant Recheck in 4 weeks

## 2021-04-29 ENCOUNTER — Ambulatory Visit (INDEPENDENT_AMBULATORY_CARE_PROVIDER_SITE_OTHER): Payer: Medicaid Other | Admitting: *Deleted

## 2021-04-29 DIAGNOSIS — Z954 Presence of other heart-valve replacement: Secondary | ICD-10-CM | POA: Diagnosis not present

## 2021-04-29 DIAGNOSIS — Z5181 Encounter for therapeutic drug level monitoring: Secondary | ICD-10-CM

## 2021-04-29 LAB — POCT INR: INR: 1.7 — AB (ref 2.0–3.0)

## 2021-04-29 NOTE — Patient Instructions (Signed)
Take warfarin 3 tablets tonight and 2 tablets tomorrow night then resume 2 tablets daily except 1 tablet on Mondays and Thursdays Keep Vit K consistant Recheck in 2 weeks

## 2021-05-13 ENCOUNTER — Ambulatory Visit (INDEPENDENT_AMBULATORY_CARE_PROVIDER_SITE_OTHER): Payer: Medicaid Other | Admitting: *Deleted

## 2021-05-13 DIAGNOSIS — Z954 Presence of other heart-valve replacement: Secondary | ICD-10-CM | POA: Diagnosis not present

## 2021-05-13 DIAGNOSIS — Z5181 Encounter for therapeutic drug level monitoring: Secondary | ICD-10-CM

## 2021-05-13 LAB — POCT INR: INR: 3.2 — AB (ref 2.0–3.0)

## 2021-05-13 NOTE — Patient Instructions (Signed)
Continue warfarin 2 tablets daily except 1 tablet on Mondays and Thursdays Keep Vit K consistant Recheck in 4 weeks

## 2021-05-26 ENCOUNTER — Ambulatory Visit: Payer: Medicaid Other | Admitting: Cardiology

## 2021-05-26 NOTE — Progress Notes (Deleted)
Clinical Summary Ms. Straub is a 45 y.o.female seen today for follow up of the following medical problems.    1. Rheumatic MV disease - - 08/2017 echo [facility]: rheumatic MV described, severe MS. Mean gradient 15. MVA by PHT 1.6. HR 84 during study.  - 10/2017 TEE moderate MS, severe MR - referred for surgery   - 12/22/17 had MVR with sorin carbomedical optiform bileaflet mechanical valve 31 mm - on coumadin and ASA   - 02/2018 echo LVEF 50-55%, normal MVR function - not able to do cardiac rehab due to cost.        -Jan 2020 echo LVEF 50-55%, cannot eval diastolic function with valve repair, trivial MR, mean MV gradient 4 mmHg.      - some swellng at times, overall controlled with torsemide.  - some SOB at times, often comes on with allergies.   - no bleeding on coumadin   2. Chronic diasotlic HF - reports some mild weight gain at home. Some occasional LE edema, mainly feels abdomninal distension. Can have some SOB - has required and tolerated high doses of diuretic, has been on torsemide 80mg  bid.    - swelling has been controlled     3. 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     albuterol (PROAIR HFA) 108 (90 Base) MCG/ACT inhaler Inhale 2 puffs into the lungs every 4 (four) hours as needed for wheezing or shortness of breath.      ALPRAZolam (XANAX) 0.5 MG tablet Take 0.5 mg by mouth 4 (four) times daily as needed for anxiety.      aspirin EC 81 MG tablet Take 1 tablet (81 mg total) by mouth daily. 90 tablet 3   fluticasone (FLONASE) 50 MCG/ACT nasal spray Place 2 sprays into both nostrils daily as needed for allergies.      metoprolol tartrate (LOPRESSOR) 25 MG tablet Take 1 tablet (25 mg total) by mouth 2 (two) times daily. 180 tablet 1   potassium chloride SA (K-DUR) 20 MEQ tablet TAKE 2 TABLETS BY MOUTH ONCE DAILY. 60 tablet 6   sertraline (ZOLOFT) 100 MG tablet Take 250 mg by mouth daily.      torsemide (DEMADEX) 20 MG tablet TAKE 4 TABLETS TWICE DAILY. 240 tablet 6   traZODone (DESYREL) 100 MG tablet Take 200 mg by mouth at bedtime.      warfarin (COUMADIN) 2 MG tablet TAKE 2 TABLETS ONCE A  DAY OR AS DIRECTED BY COUMADIN CLINIC. 65 tablet 6   No current facility-administered medications for this visit.     Past Surgical History:  Procedure Laterality Date   APPENDECTOMY     age 29   CARDIAC CATHETERIZATION  11/03/2017   MITRAL VALVE REPAIR Right 12/22/2017   Procedure: MINIMALLY INVASIVE MITRAL VALVE REPLACEMENT(MVR);  Surgeon: Purcell Nails, MD;  Location: [facility] OR;  Service: Open Heart Surgery;  Laterality: Right;   MULTIPLE EXTRACTIONS WITH ALVEOLOPLASTY N/A 12/06/2017   Procedure: Extraction of tooth #'s 4-6, 8-12, 20,22-29 and 32 with alveoloplasty;  Surgeon: Charlynne Pander, DDS;  Location: [facility] OR;  Service: Oral Surgery;  Laterality: N/A;   MULTIPLE TOOTH EXTRACTIONS  2015   OTHER SURGICAL HISTORY Left 2016   open surgery for kidney stones   RIGHT AND LEFT HEART CATH N/A 11/03/2017   Procedure: RIGHT AND LEFT HEART CATH;   Surgeon: Kathleene Hazel, MD;  Location: [facility] INVASIVE CV LAB;  Service: Cardiovascular;  Laterality: N/A;   TEE WITHOUT CARDIOVERSION N/A 11/03/2017   Procedure: TRANSESOPHAGEAL ECHOCARDIOGRAM (TEE);  Surgeon: Lewayne Bunting, MD;  Location: [facility] ENDOSCOPY;  Service: Cardiovascular;  Laterality: N/A;   TEE WITHOUT CARDIOVERSION N/A 12/22/2017   Procedure: TRANSESOPHAGEAL ECHOCARDIOGRAM (TEE);  Surgeon: Purcell Nails, MD;  Location: [facility] OR;  Service: Open Heart Surgery;  Laterality: N/A;   TONSILLECTOMY     6     No Known Allergies    Family History  Problem Relation Age of Onset   Cancer Father    Dementia Maternal Grandmother    Heart disease Maternal Grandfather        had open heart problems   Cancer Paternal Grandmother    Cancer Paternal Grandfather      Social History Ms. Severt reports that she quit smoking about 3 years ago. Her smoking use included cigarettes. She started smoking about 29 years ago. She has a 13.00 pack-year smoking history. She has never used smokeless tobacco. Ms. Renner reports that she does not currently use alcohol.   Review of Systems CONSTITUTIONAL: No weight loss, fever, chills, weakness or fatigue.  HEENT: Eyes: No visual loss, blurred vision, double vision or yellow sclerae.No hearing loss, sneezing, congestion, runny nose or sore throat.  SKIN: No rash or itching.  CARDIOVASCULAR:  RESPIRATORY: No shortness of breath, cough or sputum.  GASTROINTESTINAL: No anorexia, nausea, vomiting or diarrhea. No abdominal pain or blood.  GENITOURINARY: No burning on urination, no polyuria NEUROLOGICAL: No headache, dizziness, syncope, paralysis, ataxia, numbness or tingling in the extremities. No change in bowel or bladder control.  MUSCULOSKELETAL: No muscle, back pain, joint pain or stiffness.  LYMPHATICS: No enlarged nodes. No history of splenectomy.  PSYCHIATRIC: No history of depression or anxiety.  ENDOCRINOLOGIC: No reports of sweating,  cold or heat intolerance. No polyuria or polydipsia.  Marland Kitchen   Physical Examination There were no vitals filed for this visit. There were no vitals filed for this visit.  Gen: resting comfortably, no acute distress HEENT: no scleral icterus, pupils equal round and reactive, no palptable cervical adenopathy,  CV Resp: Clear to auscultation bilaterally GI: abdomen is soft, non-tender, non-distended, normal bowel sounds, no hepatosplenomegaly MSK: extremities are warm, no edema.  Skin: warm, no rash Neuro:  no focal deficits Psych: appropriate affect   Diagnostic Studies  10/2017 echo Study Conclusions   - Left ventricle: Systolic function was normal. The estimated   ejection fraction was in the range of 55% to 60%. Wall  motion was   normal; there were no regional wall motion abnormalities. - Aortic valve: There was trivial regurgitation. - Mitral valve: Mild thickening, consistent with rheumatic disease.   Mobility of the posterior leaflet was severely restricted. The   findings are consistent with moderate stenosis. There was severe   regurgitation. Valve area by pressure half-time: 1.76 cm^2. - Left atrium: The atrium was severely dilated. No evidence of   thrombus in the atrial cavity or appendage. - Atrial septum: No defect or patent foramen ovale was identified. - Tricuspid valve: No evidence of vegetation. There was   mild-moderate regurgitation. - Pulmonic valve: No evidence of vegetation.   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     10/2017 cath 1. NO angiographic evidence of CAD 2. Large v-wave on wedge tracing c/w severe MR    Recommendations: Continue workup for surgical MV replacement/repair. I do not think she will be a candidate for balloon valvuloplasty given her severe MR.      02/2018 echo Study Conclusions   - Left ventricle: The cavity size was normal. Systolic function was   normal. The estimated ejection fraction was in the range of 50%   to 55%. Wall motion was normal; there were no regional wall   motion abnormalities. Features are consistent with a pseudonormal   left ventricular filling pattern, with concomitant abnormal   relaxation and increased filling pressure (grade 2 diastolic   dysfunction). Doppler parameters are consistent with high   ventricular filling pressure. - Mitral valve: A mechanical prosthesis was present and functioning   normally. Mean gradient (D): 4 mm Hg. Valve area by pressure   half-time: 2.93 cm^2. Valve area by continuity equation (using   LVOT flow): 2.1 cm^2. - Left atrium: The atrium was mildly dilated. - Tricuspid valve: There was mild regurgitation. - Pulmonic valve: There was trivial regurgitation.   Impressions:   - Low normal LVF with EF 50-55%, mild TR, stable mechanical MVR   well seated with no perivalvular MR. The mean MVG is stable at   and MVA 2.93cm2 by PHT and 2.1cm2 by continuity equation.   Since last echo, mechanical MVR is new.     Jan 2020 echo IMPRESSIONS      1. The left ventricle has The cavity size is normal. There is no left ventricular wall thickness. Indeterminate diastolic filling patterns. The left ventricular diastology could not be evaluated due to mitral valve replacement/repair.  2. A 31 Sorin Carbomedics mechanical valve is present in the mitral position.  3. Normal tricuspid valve.  4. The aortic valve tricuspid. There ismild calcification of the aortic valve.  5. The aortic root is normal is size and structure.  6. Normal right atrial size.  7. Upper normal left atrial size.  8. The interatrial septum appears to  be lipomatous.  9. No atrial level shunt detected by color flow Doppler. 10. The mitral valve has been replaced. Regurgitation is trivial by color flow Doppler.     Assessment and Plan  1. Mitral valve replacement/Mitral stenosis - last echo shows normal LV function, normal MVR - no recent symptoms, continue current meds.  2. Chronic diastolic HF -has required and tolerated high doses of torsemide - no recent issues with edema, continue current diuretic.       Antoine Poche, M.D., F.A.C.C.

## 2021-06-10 ENCOUNTER — Ambulatory Visit (INDEPENDENT_AMBULATORY_CARE_PROVIDER_SITE_OTHER): Payer: Medicaid Other | Admitting: *Deleted

## 2021-06-10 DIAGNOSIS — Z5181 Encounter for therapeutic drug level monitoring: Secondary | ICD-10-CM

## 2021-06-10 DIAGNOSIS — Z954 Presence of other heart-valve replacement: Secondary | ICD-10-CM

## 2021-06-10 LAB — POCT INR: INR: 2.5 (ref 2.0–3.0)

## 2021-06-10 NOTE — Patient Instructions (Signed)
Take warfarin 3 tablets tonight then resume 2 tablets daily except 1 tablet on Mondays and Thursdays Keep Vit K consistant Recheck in 4 weeks

## 2021-07-08 ENCOUNTER — Ambulatory Visit (INDEPENDENT_AMBULATORY_CARE_PROVIDER_SITE_OTHER): Payer: Medicaid Other | Admitting: *Deleted

## 2021-07-08 ENCOUNTER — Other Ambulatory Visit: Payer: Self-pay

## 2021-07-08 DIAGNOSIS — Z5181 Encounter for therapeutic drug level monitoring: Secondary | ICD-10-CM

## 2021-07-08 DIAGNOSIS — Z954 Presence of other heart-valve replacement: Secondary | ICD-10-CM | POA: Diagnosis not present

## 2021-07-08 LAB — POCT INR: INR: 2.8 (ref 2.0–3.0)

## 2021-07-08 NOTE — Patient Instructions (Signed)
Continue warfarin 2 tablets daily except 1 tablet on Mondays and Thursdays Keep Vit K consistant Recheck in 6 weeks 

## 2021-08-04 ENCOUNTER — Encounter: Payer: Self-pay | Admitting: Cardiology

## 2021-08-04 ENCOUNTER — Ambulatory Visit (INDEPENDENT_AMBULATORY_CARE_PROVIDER_SITE_OTHER): Payer: Medicaid Other | Admitting: Cardiology

## 2021-08-04 ENCOUNTER — Other Ambulatory Visit: Payer: Self-pay

## 2021-08-04 VITALS — BP 114/76 | HR 74 | Ht 68.0 in | Wt 252.0 lb

## 2021-08-04 DIAGNOSIS — I5032 Chronic diastolic (congestive) heart failure: Secondary | ICD-10-CM | POA: Diagnosis not present

## 2021-08-04 DIAGNOSIS — Z954 Presence of other heart-valve replacement: Secondary | ICD-10-CM | POA: Diagnosis not present

## 2021-08-04 MED ORDER — TORSEMIDE 20 MG PO TABS: 80.0000 mg | ORAL_TABLET | Freq: Two times a day (BID) | ORAL | 6 refills | Status: DC

## 2021-08-04 NOTE — Progress Notes (Signed)
Clinical Summary Gloria James is a 46 y.o.female seen today for follow up of the following medical problems.    1. Rheumatic MV disease - - 08/2017 echo [facility]: rheumatic MV described, severe MS. Mean gradient 15. MVA by PHT 1.6. HR 84 during study.  - 10/2017 TEE moderate MS, severe MR - referred for surgery   - 12/22/17 had MVR with sorin carbomedical optiform bileaflet mechanical valve 31 mm - on coumadin and ASA   - 02/2018 echo LVEF 50-55%, normal MVR function -Jan 2020 echo LVEF 50-55%, cannot eval diastolic function with valve repair, trivial MR, mean MV gradient 4 mmHg.      - some ongoing LE edema. Some SOB/DOE - reports pcp did labs about 1 month - no bleeding coumadin     2. Chronic diasotlic HF - reports some mild weight gain at home. Some occasional LE edema, mainly feels abdomninal distension. Can have some SOB - has required and tolerated high doses of diuretic, has been on torsemide 80mg  bid.    - some recent LE edema     3. 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     albuterol (PROAIR HFA) 108 (90 Base) MCG/ACT inhaler Inhale 2 puffs into the lungs every 4 (four) hours as needed for wheezing or shortness of breath.      ALPRAZolam (XANAX) 0.5 MG tablet Take 0.5 mg by mouth 4 (four) times daily as needed for anxiety.      aspirin EC 81 MG tablet Take 1 tablet (81 mg total) by mouth daily. 90 tablet 3   fluticasone (FLONASE) 50 MCG/ACT nasal spray Place 2 sprays into both nostrils daily as needed for allergies.      metoprolol tartrate (LOPRESSOR) 25 MG tablet Take 1 tablet (25 mg total) by mouth 2 (two) times daily. 180 tablet 1   potassium chloride SA (K-DUR) 20 MEQ tablet TAKE 2 TABLETS BY MOUTH ONCE DAILY. 60 tablet 6   sertraline (ZOLOFT) 100 MG tablet Take 250 mg by mouth daily.      torsemide (DEMADEX) 20 MG tablet TAKE 4 TABLETS TWICE DAILY. 240 tablet 6   traZODone (DESYREL) 100 MG tablet Take 200 mg by mouth at bedtime.      warfarin (COUMADIN) 2 MG tablet TAKE 2 TABLETS ONCE A DAY OR AS DIRECTED BY COUMADIN CLINIC. 65 tablet 6   No current facility-administered  medications for this visit.     Past Surgical History:  Procedure Laterality Date   APPENDECTOMY     age 82   CARDIAC CATHETERIZATION  11/03/2017   MITRAL VALVE REPAIR Right 12/22/2017   Procedure: MINIMALLY INVASIVE MITRAL VALVE REPLACEMENT(MVR);  Surgeon: Purcell Nails, MD;  Location: [facility] OR;  Service: Open Heart Surgery;  Laterality: Right;   MULTIPLE EXTRACTIONS WITH ALVEOLOPLASTY N/A 12/06/2017   Procedure: Extraction of tooth #'s 4-6, 8-12, 20,22-29 and 32 with alveoloplasty;  Surgeon: Charlynne Pander, DDS;  Location: [facility] OR;  Service: Oral Surgery;  Laterality: N/A;   MULTIPLE TOOTH EXTRACTIONS  2015   OTHER SURGICAL HISTORY Left 2016   open surgery for kidney stones   RIGHT AND LEFT HEART CATH N/A 11/03/2017   Procedure: RIGHT AND LEFT HEART CATH;  Surgeon: Kathleene Hazel, MD;  Location: [facility] INVASIVE CV LAB;  Service:  Cardiovascular;  Laterality: N/A;   TEE WITHOUT CARDIOVERSION N/A 11/03/2017   Procedure: TRANSESOPHAGEAL ECHOCARDIOGRAM (TEE);  Surgeon: Lewayne Bunting, MD;  Location: [facility] ENDOSCOPY;  Service: Cardiovascular;  Laterality: N/A;   TEE WITHOUT CARDIOVERSION N/A 12/22/2017   Procedure: TRANSESOPHAGEAL ECHOCARDIOGRAM (TEE);  Surgeon: Purcell Nails, MD;  Location: [facility] OR;  Service: Open Heart Surgery;  Laterality: N/A;   TONSILLECTOMY     6     No Known Allergies    Family History  Problem Relation Age of Onset   Cancer Father    Dementia Maternal Grandmother    Heart disease Maternal Grandfather        had open heart problems   Cancer Paternal Grandmother    Cancer Paternal Grandfather      Social History Gloria James reports that she quit smoking about 3 years ago. Her smoking use included cigarettes. She started smoking about 29 years ago. She has a 13.00 pack-year smoking history. She has never used smokeless tobacco. Gloria James reports that she does not currently use alcohol.   Review of Systems CONSTITUTIONAL: No weight loss, fever, chills, weakness or fatigue.  HEENT: Eyes: No visual loss, blurred vision, double vision or yellow sclerae.No hearing loss, sneezing, congestion, runny nose or sore throat.  SKIN: No rash or itching.  CARDIOVASCULAR: per hpi RESPIRATORY: per hpi  GASTROINTESTINAL: No anorexia, nausea, vomiting or diarrhea. No abdominal pain or blood.  GENITOURINARY: No burning on urination, no polyuria NEUROLOGICAL: No headache, dizziness, syncope, paralysis, ataxia, numbness or tingling in the extremities. No change in bowel or bladder control.  MUSCULOSKELETAL: No muscle, back pain, joint pain or stiffness.  LYMPHATICS: No enlarged nodes. No history of splenectomy.  PSYCHIATRIC: No history of depression or anxiety.  ENDOCRINOLOGIC: No reports of sweating, cold or heat intolerance. No polyuria or polydipsia.  Marland Kitchen   Physical Examination Today's Vitals    08/04/21 1420  BP: 114/76  Pulse: 74  SpO2: 96%  Weight: 252 lb (114.3 kg)  Height:  (1.727 m)   Body mass index is 38.32 kg/m.  Gen: resting comfortably, no acute distress HEENT: no scleral icterus, pupils equal round and reactive, no palptable cervical adenopathy,  CV: RRR, mechanical S1, no mr/g, no jvd Resp: Clear to auscultation bilaterally GI: abdomen is soft, non-tender, non-distended, normal bowel sounds, no hepatosplenomegaly MSK: extremities are warm, no edema.  Skin: warm, no rash Neuro:  no focal deficits Psych: appropriate affect   Diagnostic Studies 10/2017 echo Study Conclusions   - Left ventricle: Systolic function was normal. The estimated   ejection fraction was in the  range of 55% to 60%. Wall motion was   normal; there were no regional wall motion abnormalities. - Aortic valve: There was trivial regurgitation. - Mitral valve: Mild thickening, consistent with rheumatic disease.   Mobility of the posterior leaflet was severely restricted. The   findings are consistent with moderate stenosis. There was severe   regurgitation. Valve area by pressure half-time: 1.76 cm^2. - Left atrium: The atrium was severely dilated. No evidence of   thrombus in the atrial cavity or appendage. - Atrial septum: No defect or patent foramen ovale was identified. - Tricuspid valve: No evidence of vegetation. There was   mild-moderate regurgitation. - Pulmonic valve: No evidence of vegetation.   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     10/2017 cath 1. NO angiographic evidence of CAD 2. Large v-wave on wedge tracing c/w severe MR    Recommendations: Continue workup for surgical MV replacement/repair. I do not think she will be a candidate for balloon valvuloplasty given her severe MR.      02/2018 echo Study Conclusions   - Left ventricle: The cavity size was normal. Systolic function was   normal. The estimated ejection fraction was in the range of 50%   to 55%. Wall motion was normal; there were no regional wall   motion abnormalities. Features are consistent with a pseudonormal   left ventricular filling pattern, with concomitant abnormal   relaxation and increased filling pressure (grade 2 diastolic   dysfunction). Doppler parameters are consistent with high   ventricular filling pressure. - Mitral valve: A mechanical prosthesis was present and functioning   normally. Mean gradient (D): 4 mm Hg. Valve area by pressure   half-time: 2.93 cm^2. Valve area by continuity equation (using   LVOT flow): 2.1 cm^2. - Left atrium: The atrium was mildly dilated. - Tricuspid valve: There was mild regurgitation. - Pulmonic valve: There was trivial regurgitation.   Impressions:   - Low normal LVF with EF 50-55%, mild TR, stable mechanical MVR   well seated with no perivalvular MR. The mean MVG is stable at   and MVA 2.93cm2 by PHT and 2.1cm2 by continuity equation.   Since last echo, mechanical MVR is new.     Jan 2020 echo IMPRESSIONS      1. The left ventricle has The cavity size is normal. There is no left ventricular wall thickness. Indeterminate diastolic filling patterns. The left ventricular diastology could not be evaluated due to mitral valve replacement/repair.  2. A 31 Sorin [employer]omed[employer] mechanical valve is present in the mitral position.  3. Normal tricuspid valve.  4. The aortic valve tricuspid. There ismild calcification of the aortic valve.  5. The aortic root is normal is size and structure.  6. Normal right atrial size.  7. Upper normal left atrial size.  8. The interatrial septum appears to  be lipomatous.  9. No atrial level shunt detected by color flow Doppler. 10. The mitral valve has been replaced. Regurgitation is trivial by color flow Doppler.      Assessment and Plan  1. Mitral valve replacement/Mitral stenosis - last echo shows normal LV function, normal MVR - continues  to do well, continue coumadin/aspirin in setting of mechanical valve     2. Chronic diastolic HF -has required and tolerated high doses of torsemide - some recent LE edema, DOE. Change torsemide to 80mg  bid, may take additional 20mg  prn swelling -request pcp labs  EKG shows NSR  F/u 1 year  , M.D.

## 2021-08-04 NOTE — Addendum Note (Signed)
Addended by: Laurine Blazer on: 08/04/2021 02:56 PM   Modules accepted: Orders

## 2021-08-04 NOTE — Patient Instructions (Addendum)
Medication Instructions:  Change Torsemide to 80mg  twice a day - may take an additional 20mg  as needed for swelling.   Continue all other medications.     Labwork: none  Testing/Procedures: none  Follow-Up: Your physician wants you to follow up in:  1 year.  You will receive a reminder letter in the mail one-two months in advance.  If you don't receive a letter, please call our office to schedule the follow up appointment    Any Other Special Instructions Will Be Listed Below (If Applicable).   If you need a refill on your cardiac medications before your next appointment, please call your pharmacy.

## 2021-08-07 ENCOUNTER — Encounter: Payer: Self-pay | Admitting: *Deleted

## 2021-08-19 ENCOUNTER — Ambulatory Visit (INDEPENDENT_AMBULATORY_CARE_PROVIDER_SITE_OTHER): Payer: Medicaid Other | Admitting: *Deleted

## 2021-08-19 DIAGNOSIS — Z5181 Encounter for therapeutic drug level monitoring: Secondary | ICD-10-CM | POA: Diagnosis not present

## 2021-08-19 DIAGNOSIS — Z954 Presence of other heart-valve replacement: Secondary | ICD-10-CM

## 2021-08-19 LAB — POCT INR: INR: 3.3 — AB (ref 2.0–3.0)

## 2021-08-19 NOTE — Patient Instructions (Signed)
Continue warfarin 2 tablets daily except 1 tablet on Mondays and Thursdays Keep Vit K consistant Recheck in 6 weeks 

## 2021-09-22 ENCOUNTER — Encounter: Payer: Self-pay | Admitting: *Deleted

## 2021-09-22 ENCOUNTER — Telehealth: Payer: Self-pay | Admitting: Cardiology

## 2021-09-22 DIAGNOSIS — I5032 Chronic diastolic (congestive) heart failure: Secondary | ICD-10-CM

## 2021-09-22 DIAGNOSIS — Z79899 Other long term (current) drug therapy: Secondary | ICD-10-CM

## 2021-09-22 NOTE — Telephone Encounter (Signed)
Labs requested from pcp now.  ?

## 2021-09-22 NOTE — Telephone Encounter (Signed)
Patient called stating she received results from her PCP today that they advised her to report to Dr. Harl Bowie. She states on 04/29/21 her creatine was 1.04. Last week her creatine came back 1.18. Her eGFR on 04/29/21 was 67. Last week it was 58. Please advise.  ?

## 2021-09-24 NOTE — Telephone Encounter (Signed)
Labs have been scanned into epic. ? ?Message fwd to provider for review.  ?

## 2021-09-25 NOTE — Telephone Encounter (Signed)
Slight decline in renal function, may be related to fluid pills. Verify she is taking torsemide 80mg  bid, we could try lowering to 80mg  in AM and 60mg  in PM. Could recheck bmet 3 weeks. ? ? ?J Alazne Quant DM ?

## 2021-09-30 ENCOUNTER — Other Ambulatory Visit: Payer: Self-pay | Admitting: *Deleted

## 2021-09-30 ENCOUNTER — Ambulatory Visit (INDEPENDENT_AMBULATORY_CARE_PROVIDER_SITE_OTHER): Payer: Medicaid Other | Admitting: *Deleted

## 2021-09-30 DIAGNOSIS — Z79899 Other long term (current) drug therapy: Secondary | ICD-10-CM

## 2021-09-30 DIAGNOSIS — I5032 Chronic diastolic (congestive) heart failure: Secondary | ICD-10-CM

## 2021-09-30 DIAGNOSIS — Z5181 Encounter for therapeutic drug level monitoring: Secondary | ICD-10-CM | POA: Diagnosis not present

## 2021-09-30 DIAGNOSIS — Z954 Presence of other heart-valve replacement: Secondary | ICD-10-CM | POA: Diagnosis not present

## 2021-09-30 LAB — POCT INR: INR: 2.9 (ref 2.0–3.0)

## 2021-09-30 MED ORDER — TORSEMIDE 20 MG PO TABS
80.0000 mg | ORAL_TABLET | Freq: Every morning | ORAL | 6 refills | Status: DC
Start: 2021-09-30 — End: 2022-02-02

## 2021-09-30 NOTE — Telephone Encounter (Signed)
Pt came in for her check with Lattie Haw today and wanted to know what Dr. Harl Bowie said concerning her labs  ? ?Please call [phone] ?

## 2021-09-30 NOTE — Patient Instructions (Signed)
Continue warfarin 2 tablets daily except 1 tablet on Mondays and Thursdays Keep Vit K consistant Recheck in 6 weeks 

## 2021-09-30 NOTE — Telephone Encounter (Signed)
Patient informed and verbalized understanding of plan. Lab order faxed to [facility]. 

## 2021-11-11 ENCOUNTER — Ambulatory Visit (INDEPENDENT_AMBULATORY_CARE_PROVIDER_SITE_OTHER): Payer: Medicaid Other | Admitting: *Deleted

## 2021-11-11 DIAGNOSIS — Z5181 Encounter for therapeutic drug level monitoring: Secondary | ICD-10-CM

## 2021-11-11 DIAGNOSIS — Z954 Presence of other heart-valve replacement: Secondary | ICD-10-CM | POA: Diagnosis not present

## 2021-11-11 DIAGNOSIS — Z7901 Long term (current) use of anticoagulants: Secondary | ICD-10-CM

## 2021-11-11 LAB — POCT INR: INR: 2.5 (ref 2.0–3.0)

## 2021-11-11 NOTE — Patient Instructions (Signed)
Continue warfarin 2 tablets daily except 1 tablet on Mondays and Thursdays Keep Vit K consistant Recheck in 6 weeks 

## 2021-11-21 ENCOUNTER — Other Ambulatory Visit: Payer: Self-pay | Admitting: Cardiology

## 2021-11-21 DIAGNOSIS — Z5181 Encounter for therapeutic drug level monitoring: Secondary | ICD-10-CM

## 2021-11-24 NOTE — Telephone Encounter (Signed)
Received refill request for warfarin:  Last INR was 2.5 on 11/11/21 Next INR due on 12/23/21 Last OV was 08/04/21  Dominga Ferry MD  Refill approved

## 2021-12-23 ENCOUNTER — Ambulatory Visit (INDEPENDENT_AMBULATORY_CARE_PROVIDER_SITE_OTHER): Payer: Medicaid Other | Admitting: *Deleted

## 2021-12-23 DIAGNOSIS — Z5181 Encounter for therapeutic drug level monitoring: Secondary | ICD-10-CM | POA: Diagnosis not present

## 2021-12-23 DIAGNOSIS — Z954 Presence of other heart-valve replacement: Secondary | ICD-10-CM | POA: Diagnosis not present

## 2021-12-23 LAB — POCT INR: INR: 3.1 — AB (ref 2.0–3.0)

## 2021-12-23 NOTE — Patient Instructions (Signed)
Continue warfarin 2 tablets daily except 1 tablet on Mondays and Thursdays Keep Vit K consistant Recheck in 6 weeks 

## 2022-01-26 ENCOUNTER — Telehealth: Payer: Self-pay | Admitting: Cardiology

## 2022-01-26 NOTE — Telephone Encounter (Signed)
  Pt dropped off lab work from PCP to show Dr. [name].   PCP wanted Dr. [name] to see kidney function

## 2022-01-28 NOTE — Telephone Encounter (Signed)
Slight decrease in kidney function. Verify taking torsemide 80mg  in am and 60mg  in pm, if so lower to 60mg  bid please  MD

## 2022-01-28 NOTE — Telephone Encounter (Signed)
Left message to return call 

## 2022-02-02 ENCOUNTER — Telehealth: Payer: Self-pay | Admitting: Cardiology

## 2022-02-02 MED ORDER — TORSEMIDE 20 MG PO TABS
60.0000 mg | ORAL_TABLET | Freq: Two times a day (BID) | ORAL | Status: DC
Start: 2022-02-02 — End: 2022-08-26

## 2022-02-02 NOTE — Telephone Encounter (Signed)
Previous note in chart already by [facility] LPN Will close

## 2022-02-02 NOTE — Telephone Encounter (Signed)
Patient notified and verbalized understanding. 

## 2022-02-02 NOTE — Telephone Encounter (Signed)
Patient states she was returning call. Please advise  

## 2022-02-03 ENCOUNTER — Ambulatory Visit (INDEPENDENT_AMBULATORY_CARE_PROVIDER_SITE_OTHER): Payer: Medicaid Other | Admitting: *Deleted

## 2022-02-03 DIAGNOSIS — Z954 Presence of other heart-valve replacement: Secondary | ICD-10-CM | POA: Diagnosis not present

## 2022-02-03 DIAGNOSIS — Z5181 Encounter for therapeutic drug level monitoring: Secondary | ICD-10-CM

## 2022-02-03 LAB — POCT INR: INR: 2.9 (ref 2.0–3.0)

## 2022-02-03 NOTE — Patient Instructions (Signed)
Continue warfarin 2 tablets daily except 1 tablet on Mondays and Thursdays Keep Vit K consistant Recheck in 6 weeks 

## 2022-03-17 ENCOUNTER — Ambulatory Visit: Payer: Medicaid Other | Attending: Cardiology | Admitting: *Deleted

## 2022-03-17 DIAGNOSIS — Z5181 Encounter for therapeutic drug level monitoring: Secondary | ICD-10-CM | POA: Diagnosis not present

## 2022-03-17 DIAGNOSIS — Z954 Presence of other heart-valve replacement: Secondary | ICD-10-CM

## 2022-03-17 LAB — POCT INR: INR: 3.1 — AB (ref 2.0–3.0)

## 2022-03-17 NOTE — Patient Instructions (Signed)
Continue warfarin 2 tablets daily except 1 tablet on Mondays and Thursdays Keep Vit K consistant Recheck in 6 weeks

## 2022-04-27 ENCOUNTER — Telehealth: Payer: Self-pay | Admitting: Cardiology

## 2022-04-27 NOTE — Telephone Encounter (Signed)
Was calling to speak with you. Please advise

## 2022-04-27 NOTE — Telephone Encounter (Signed)
Pt tested positive for Covid on Sunday 04/25/22.  Rescheduled INR appt from 04/28/22 to 05/03/22.  Had bloody mucus when blowing nose this morning.  Wants to know if she should hold 1 dose of warfarin.  Told pt to monitor over night and in the morning.  If it has improved do not miss dose.  If worse may hold dose tomorrow.  She verbalized understanding.

## 2022-05-03 ENCOUNTER — Ambulatory Visit: Payer: Medicaid Other | Attending: Cardiology | Admitting: *Deleted

## 2022-05-03 DIAGNOSIS — Z5181 Encounter for therapeutic drug level monitoring: Secondary | ICD-10-CM | POA: Diagnosis not present

## 2022-05-03 DIAGNOSIS — Z954 Presence of other heart-valve replacement: Secondary | ICD-10-CM | POA: Diagnosis not present

## 2022-05-03 LAB — POCT INR: INR: 5.8 — AB (ref 2.0–3.0)

## 2022-05-03 NOTE — Patient Instructions (Signed)
Hold warfarin x 3 days then resume 2 tablets daily except 1 tablet on Mondays and Thursdays Keep Vit K consistant Recheck in 1 week

## 2022-05-13 ENCOUNTER — Ambulatory Visit: Payer: Medicaid Other | Attending: Cardiology | Admitting: *Deleted

## 2022-05-13 DIAGNOSIS — Z954 Presence of other heart-valve replacement: Secondary | ICD-10-CM | POA: Diagnosis not present

## 2022-05-13 DIAGNOSIS — Z5181 Encounter for therapeutic drug level monitoring: Secondary | ICD-10-CM | POA: Diagnosis not present

## 2022-05-13 LAB — POCT INR: INR: 2.9 (ref 2.0–3.0)

## 2022-05-13 NOTE — Patient Instructions (Signed)
Continue 2 tablets daily except 1 tablet on Mondays and Thursdays Keep Vit K consistant Recheck in 3 weeks

## 2022-06-03 ENCOUNTER — Ambulatory Visit: Payer: Medicaid Other | Attending: Cardiology | Admitting: *Deleted

## 2022-06-03 DIAGNOSIS — Z5181 Encounter for therapeutic drug level monitoring: Secondary | ICD-10-CM

## 2022-06-03 DIAGNOSIS — Z954 Presence of other heart-valve replacement: Secondary | ICD-10-CM

## 2022-06-03 LAB — POCT INR: INR: 4.2 — AB (ref 2.0–3.0)

## 2022-06-03 NOTE — Patient Instructions (Addendum)
Hold warfarin tonight 2 tablets daily except 1 tablet on Mondays and Thursdays Eat extra greens today Recheck in 3 weeks

## 2022-06-24 ENCOUNTER — Ambulatory Visit: Payer: Medicaid Other | Attending: Cardiology | Admitting: *Deleted

## 2022-06-24 DIAGNOSIS — Z954 Presence of other heart-valve replacement: Secondary | ICD-10-CM

## 2022-06-24 DIAGNOSIS — Z5181 Encounter for therapeutic drug level monitoring: Secondary | ICD-10-CM

## 2022-06-24 LAB — POCT INR: INR: 5.7 — AB (ref 2.0–3.0)

## 2022-06-24 NOTE — Patient Instructions (Signed)
Hold warfarin tonight and tomorrow night then decrease dose to 1 tablet daily except 2 tablets on Sundays, Tuesdays and Thursdays Eat extra greens today Recheck in 1 week

## 2022-06-30 ENCOUNTER — Encounter: Payer: Self-pay | Admitting: Nurse Practitioner

## 2022-06-30 ENCOUNTER — Ambulatory Visit: Payer: Medicaid Other | Attending: Nurse Practitioner | Admitting: Nurse Practitioner

## 2022-06-30 ENCOUNTER — Ambulatory Visit (INDEPENDENT_AMBULATORY_CARE_PROVIDER_SITE_OTHER): Payer: Medicaid Other | Admitting: *Deleted

## 2022-06-30 VITALS — BP 110/68 | HR 78 | Ht 68.0 in | Wt 262.0 lb

## 2022-06-30 DIAGNOSIS — Z954 Presence of other heart-valve replacement: Secondary | ICD-10-CM | POA: Diagnosis not present

## 2022-06-30 DIAGNOSIS — Z5181 Encounter for therapeutic drug level monitoring: Secondary | ICD-10-CM

## 2022-06-30 DIAGNOSIS — Z79899 Other long term (current) drug therapy: Secondary | ICD-10-CM | POA: Diagnosis not present

## 2022-06-30 DIAGNOSIS — R0609 Other forms of dyspnea: Secondary | ICD-10-CM | POA: Diagnosis not present

## 2022-06-30 DIAGNOSIS — I5032 Chronic diastolic (congestive) heart failure: Secondary | ICD-10-CM

## 2022-06-30 DIAGNOSIS — E669 Obesity, unspecified: Secondary | ICD-10-CM

## 2022-06-30 DIAGNOSIS — E785 Hyperlipidemia, unspecified: Secondary | ICD-10-CM

## 2022-06-30 LAB — POCT INR: INR: 1.9 — AB (ref 2.0–3.0)

## 2022-06-30 NOTE — Patient Instructions (Signed)
Medication Instructions:  Your physician recommends that you continue on your current medications as directed. Please refer to the Current Medication list given to you today.  *If you need a refill on your cardiac medications before your next appointment, please call your pharmacy*   Lab Work:  Your physician recommends that you return for lab work in: Fasting ( lipid, cmet, cbc)    If you have labs (blood work) drawn today and your tests are completely normal, you will receive your results only by: [place] (if you have MyChart) OR A paper copy in the mail If you have any lab test that is abnormal or we need to change your treatment, we will call you to review the results.   Testing/Procedures: Your physician has requested that you have an echocardiogram. Echocardiography is a painless test that uses sound waves to create images of your heart. It provides your doctor with information about the size and shape of your heart and how well your heart's chambers and valves are working. This procedure takes approximately one hour. There are no restrictions for this procedure. Please do NOT wear cologne, perfume, aftershave, or lotions (deodorant is allowed). Please arrive 15 minutes prior to your appointment time.     Follow-Up: At [facility], you and your health needs are our priority.  As part of our continuing mission to provide you with exceptional heart care, we have created designated Provider Care Teams.  These Care Teams include your primary Cardiologist (physician) and Advanced Practice Providers (APPs -  Physician Assistants and Nurse Practitioners) who all work together to provide you with the care you need, when you need it.  We recommend signing up for the patient portal called "MyChart".  Sign up information is provided on this After Visit Summary.  MyChart is used to connect with patients for Virtual Visits (Telemedicine).  Patients are able to view lab/test  results, encounter notes, upcoming appointments, etc.  Non-urgent messages can be sent to your provider as well.   To learn more about what you can do with MyChart, go to [email or web address].    Your next appointment:   3 month(s)  The format for your next appointment:   In Person  Provider:   Finis Bud, NP    Other Instructions Thank you for choosing [facility]!    Important Information About Sugar

## 2022-06-30 NOTE — Patient Instructions (Signed)
Take warfarin 3 tablets tonight then continue 1 tablet daily except 2 tablets on Sundays, Tuesdays and Thursdays Eat extra greens today Recheck in 1 week

## 2022-06-30 NOTE — Progress Notes (Signed)
Cardiology Office Note:    Date:  06/30/2022   ID:  Gloria James, DOB [birth year], MRN [id number]  PCP:  Manon Hilding, MD   [facility] Cardiologist:  Carlyle Dolly, MD     Referring MD: Manon Hilding, MD   CC: Here for 1 year follow-up  History of Present Illness:    Gloria James is a 47 y.o. female with a hx of the following:  HFpEF Rheumatic MS with regurgitation, s/p MVR with mechanical valve Palpitations Pulmonary nodule   Patient is a 47 year old female with past medical history as mentioned above.  In 2019 she underwent mitral valve replacement with mechanical valve.  Was placed on Coumadin.  In 2020 echocardiogram revealed normal EF, trivial MR, with mean mitral valve gradient at 4 mmHg.  Last seen by Dr. Carlyle Dolly on August 04, 2021.  Did note some lower extremity edema.  Did also note some shortness of breath/DOE.  Torsemide was changed to 80 mg twice daily, was instructed she could take additional 20 mg as needed for swelling.  She was continued on all other medications.  Instructed to follow-up in 1 year.  Today she presents for 1 year follow-up.  She states she is doing okay.  Does have some better days than others.  Denies any anginal symptoms, however she does have chest congestion.  Was diagnosed with COVID in October.  INR has been elevated, is closely followed by Coumadin clinic here in [place].  Does admit to shortness of breath with exertion.  Denies any fever, chills, nausea, or vomiting.  Does admit to some swelling in her abdomen, denies any other questions or concerns today.  Denies any palpitations, syncope, presyncope, dizziness, orthopnea, PND, leg edema, melena, hemoptysis, or claudication.  Denies any other questions or concerns today.  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   31 mm Sorin Carbomedics Optiform bileaflet mechanical valve via right mini thoracotomy approach   Tricuspid regurgitation     Past Surgical History:  Procedure Laterality Date   APPENDECTOMY     age 32   CARDIAC CATHETERIZATION  11/03/2017   MITRAL VALVE REPAIR Right 12/22/2017   Procedure: MINIMALLY INVASIVE MITRAL VALVE REPLACEMENT(MVR);  Surgeon: Rexene Alberts, MD;  Location: [facility];  Service: Open Heart Surgery;  Laterality: Right;   MULTIPLE EXTRACTIONS WITH ALVEOLOPLASTY N/A 12/06/2017   Procedure: Extraction of tooth #'s 4-6, 8-12, 20,22-29 and 32 with alveoloplasty;  Surgeon: Lenn Cal, DDS;  Location: [facility];  Service: Oral Surgery;  Laterality: N/A;   MULTIPLE TOOTH EXTRACTIONS  2015   OTHER SURGICAL HISTORY Left 2016   open surgery for kidney stones   RIGHT AND LEFT HEART CATH N/A 11/03/2017   Procedure: RIGHT AND LEFT HEART CATH;  Surgeon: Burnell Blanks, MD;  Location: [facility] CV LAB;  Service: Cardiovascular;  Laterality: N/A;   TEE WITHOUT CARDIOVERSION N/A  11/03/2017   Procedure: TRANSESOPHAGEAL ECHOCARDIOGRAM (TEE);  Surgeon: Lewayne Bunting, MD;  Location: [facility] ENDOSCOPY;  Service: Cardiovascular;  Laterality: N/A;   TEE WITHOUT CARDIOVERSION N/A 12/22/2017   Procedure: TRANSESOPHAGEAL ECHOCARDIOGRAM (TEE);  Surgeon: Purcell Nails, MD;  Location: [facility] OR;  Service: Open Heart Surgery;  Laterality: N/A;   TONSILLECTOMY     6    Current Medications: Current Meds  Medication Sig   ALPRAZolam (XANAX) 0.5 MG tablet Take 0.5 mg by mouth 4 (four) times daily as needed for anxiety.     aspirin EC 81 MG tablet Take 1 tablet (81 mg total) by mouth daily.   fluticasone (FLONASE) 50 MCG/ACT nasal spray Place 2 sprays into both nostrils daily as needed for allergies.    metoprolol tartrate (LOPRESSOR) 25 MG tablet Take 1 tablet (25 mg total) by mouth 2 (two) times daily.   potassium chloride SA (K-DUR) 20 MEQ tablet TAKE 2 TABLETS BY MOUTH ONCE DAILY.   sertraline (ZOLOFT) 100 MG tablet Take 200 mg by mouth daily.   torsemide (DEMADEX) 20 MG tablet Take 3 tablets (60 mg total) by mouth 2 (two) times daily. (Patient taking differently: Take 80 mg by mouth 2 (two) times daily.)   traZODone (DESYREL) 100 MG tablet Take 200 mg by mouth at bedtime.    warfarin (COUMADIN) 2 MG tablet TAKE 2 TABLETS ONCE A DAY OR AS DIRECTED BY COUMADIN CLINIC.     Allergies:   Patient has no known allergies.   Social History   Socioeconomic History   Marital status: Married    Spouse name: Not on file   Number of children: Not on file   Years of education: Not on file   Highest education level: Not on file  Occupational History   Not on file  Tobacco Use   Smoking status: Former    Packs/day: 0.50    Years: 26.00    Total pack years: 13.00    Types: Cigarettes    Start date: 12/18/1991    Quit date: 09/23/2017    Years since quitting: 4.7   Smokeless tobacco: Never  Vaping Use   Vaping Use: Former  Substance and Sexual Activity   Alcohol use: Not Currently   Drug use: Never   Sexual activity: Yes    Birth control/protection: I.U.D.  Other Topics Concern   Not on file  Social History Narrative   Not on file   Social Determinants of Health   Financial Resource Strain: Not on file  Food Insecurity: Not on file  Transportation Needs: Not on file  Physical Activity: Not on file  Stress: Not on file  Social Connections: Not on file     Family History: The patient's family history includes Cancer in her father, paternal grandfather, and paternal grandmother; Dementia in her  maternal grandmother; Heart disease in her maternal grandfather.  ROS:   Review of Systems  Constitutional: Negative.   HENT: Negative.    Eyes: Negative.   Respiratory:  Positive for cough, shortness of breath and wheezing. Negative for hemoptysis and sputum production.        See HPI.  Cardiovascular: Negative.   Gastrointestinal: Negative.   Genitourinary: Negative.   Musculoskeletal: Negative.   Skin: Negative.   Neurological: Negative.   Endo/Heme/Allergies: Negative.   Psychiatric/Behavioral:  Negative for depression, hallucinations, memory loss, substance abuse and suicidal ideas. The patient is nervous/anxious. The patient does not have insomnia.     Please see the history of present illness.  All other systems reviewed and are negative.  EKGs/Labs/Other Studies Reviewed:    The following studies were reviewed today:   EKG:  EKG is not ordered today.     Echocardiogram on July 27, 2018:  1. The left ventricle has The cavity size is normal. There is no left  ventricular wall thickness. Indeterminate diastolic filling patterns. The  left ventricular diastology could not be evaluated due to mitral valve  replacement/repair. LVEF 50 to 55%.  2. A 31 Sorin Carbomedics mechanical valve is present in the mitral  position.   3. Normal tricuspid valve.   4. The aortic valve tricuspid. There ismild calcification of the aortic  valve.   5. The aortic root is normal is size and structure.   6. Normal right atrial size.   7. Upper normal left atrial size.   8. The interatrial septum appears to be lipomatous.   9. No atrial level shunt detected by color flow Doppler.  10. The mitral valve has been replaced. Regurgitation is trivial by color  flow Doppler.  Right and left heart cath on Nov 03, 2017: 1. NO angiographic evidence of CAD 2. Large v-wave on wedge tracing c/w severe MR   Recommendations: Continue workup for surgical MV replacement/repair. I do not think she  will be a candidate for balloon valvuloplasty given her severe MR.   Recent Labs: No results found for requested labs within last 365 days.  Recent Lipid Panel No results found for: "CHOL", "TRIG", "HDL", "CHOLHDL", "VLDL", "LDLCALC", "LDLDIRECT"   Physical Exam:    VS:  BP 110/68   Pulse 78   Ht 5\' 8"  (1.727 m)   Wt 262 lb (118.8 kg)   SpO2 98%   BMI 39.84 kg/m     Wt Readings from Last 3 Encounters:  06/30/22 262 lb (118.8 kg)  08/04/21 252 lb (114.3 kg)  04/10/20 237 lb (107.5 kg)     GEN: Obese, 47 y.o. female  in no acute distress HEENT: Normal NECK: No JVD; No carotid bruits CARDIAC: RRR, no murmurs, rubs, gallops RESPIRATORY S1/S2,  diminished, coarse breath sounds to auscultation, inspiratory wheezing of left upper posterior lobe, congested/nonproductive cough, no rhonchi noted ABDOMEN: Firm, non-tender, distended MUSCULOSKELETAL: Ascites, no leg edema; No deformity  SKIN: Warm and dry NEUROLOGIC:  Alert and oriented x 3 PSYCHIATRIC:  Normal affect   ASSESSMENT:    1. Chronic diastolic heart failure (HCC)   2. Medication management   3. Dyspnea on exertion   4. S/P mitral valve replacement with metallic valve   5. Hyperlipidemia, unspecified hyperlipidemia type   6. Obesity (BMI 30-39.9)    PLAN:    In order of problems listed above:  HFpEF, DOE, medication monitoring Last Echocardiogram in 2020 was overall normal. No leg edema, however does have some ascites on exam, no JVD, notes DOE. No CAD noted in 2019. Will obtain 2D Echocardiogram and obtain CMET, may consider if patient will be a metolazone candidate if kidney function is stable. Continue Torsemide and may take extra 20 mg daily as needed for swelling. Low sodium diet, fluid restriction <2L, and daily weights encouraged. Educated to contact our office for weight gain of 2 lbs overnight or 5 lbs in one week. Heart healthy diet and regular cardiovascular exercise encouraged. Recommended to follow up with  PCP regarding chest congestion, may likely have URI that is exacerbating her dyspnea on exertion.   Rheumatic MS with regurgitation, s/p MVR with mechanical valve Valve functioning appropriately on exam.  Will obtain Echocardiogram as mentioned above and obtain CBC and CMET. Continue follow up with Coumadin Clinic. Care precautions and ED precautions discussed. Heart healthy diet and regular cardiovascular exercise encouraged.   HLD Last lipid panel in 08/2021 revealed high cholesterol, triglycerides, and LDL and low HDL. Not on any lipid lowering medications at this time. Will obtain CMET and FLP for update. If still elevated, will plan on starting on low dose statin. Heart healthy diet and regular cardiovascular exercise encouraged.   Obesity BMI today 39.84. Weight loss via diet and exercise encouraged. Discussed the impact being overweight would have on cardiovascular risk. Heart healthy diet and regular cardiovascular exercise encouraged.     5. Disposition: Follow up with me in 3 months and follow up with Dr. Carlyle Dolly in 6-8 months.   Medication Adjustments/Labs and Tests Ordered: Current medicines are reviewed at length with the patient today.  Concerns regarding medicines are outlined above.  Orders Placed This Encounter  Procedures   CBC   Comprehensive Metabolic Panel (CMET)   Lipid panel   ECHOCARDIOGRAM COMPLETE   No orders of the defined types were placed in this encounter.   Patient Instructions  Medication Instructions:  Your physician recommends that you continue on your current medications as directed. Please refer to the Current Medication list given to you today.  *If you need a refill on your cardiac medications before your next appointment, please call your pharmacy*   Lab Work:  Your physician recommends that you return for lab work in: Fasting ( lipid, cmet, cbc)    If you have labs (blood work) drawn today and your tests are completely normal, you  will receive your results only by: [place] (if you have MyChart) OR A paper copy in the mail If you have any lab test that is abnormal or we need to change your treatment, we will call you to review the results.   Testing/Procedures: Your physician has requested that you have an echocardiogram. Echocardiography is a painless test that uses sound waves to create images of your heart. It provides your doctor with information about the size and shape of your heart and how well your heart's chambers and valves are working. This procedure takes approximately one hour. There are no restrictions for this procedure. Please do NOT wear cologne, perfume, aftershave, or lotions (deodorant is allowed). Please arrive 15 minutes prior to your appointment time.     Follow-Up: At [facility], you and your health needs are our priority.  As part of our continuing mission to provide you with exceptional heart care, we have created designated Provider Care Teams.  These Care Teams include your primary Cardiologist (physician) and Advanced Practice Providers (APPs -  Physician Assistants and Nurse Practitioners) who all work together to provide you with the care you need, when you need it.  We recommend signing up for the patient portal called "MyChart".  Sign up information is provided on this After Visit Summary.  MyChart is used to connect with patients for Virtual Visits (Telemedicine).  Patients are able to view lab/test results, encounter notes, upcoming appointments, etc.  Non-urgent messages can be sent to your provider as well.   To learn more about what you can do with MyChart, go to [email or web address].    Your next appointment:   3 month(s)  The format for your next appointment:   In Person  Provider:   Finis Bud, NP    Other Instructions Thank you for choosing [facility]!    Important Information About Sugar         Signed, Sharlene Dory, NP   06/30/2022 9:05 AM    [facility]

## 2022-07-08 ENCOUNTER — Ambulatory Visit: Payer: Medicaid Other | Attending: Nurse Practitioner

## 2022-07-08 ENCOUNTER — Ambulatory Visit (INDEPENDENT_AMBULATORY_CARE_PROVIDER_SITE_OTHER): Payer: Medicaid Other | Admitting: *Deleted

## 2022-07-08 DIAGNOSIS — Z954 Presence of other heart-valve replacement: Secondary | ICD-10-CM

## 2022-07-08 DIAGNOSIS — Z5181 Encounter for therapeutic drug level monitoring: Secondary | ICD-10-CM | POA: Diagnosis not present

## 2022-07-08 DIAGNOSIS — I5032 Chronic diastolic (congestive) heart failure: Secondary | ICD-10-CM | POA: Diagnosis not present

## 2022-07-08 LAB — POCT INR: INR: 3.1 — AB (ref 2.0–3.0)

## 2022-07-08 NOTE — Patient Instructions (Signed)
Continue warfarin 1 tablet daily except 2 tablets on Sundays, Tuesdays and Thursdays Eat extra greens today Recheck in 3 weeks

## 2022-07-11 LAB — ECHOCARDIOGRAM COMPLETE
AR max vel: 2.07 cm2
AV Area VTI: 2.15 cm2
AV Area mean vel: 1.92 cm2
AV Mean grad: 7.1 mmHg
AV Peak grad: 14 mmHg
Ao pk vel: 1.87 m/s
Area-P 1/2: 2.52 cm2
Calc EF: 64.2 %
MV M vel: 1.95 m/s
MV Peak grad: 15.2 mmHg
S' Lateral: 2.5 cm
Single Plane A2C EF: 65.2 %
Single Plane A4C EF: 60.8 %

## 2022-07-27 ENCOUNTER — Encounter: Payer: Self-pay | Admitting: *Deleted

## 2022-07-29 ENCOUNTER — Ambulatory Visit: Payer: Medicaid Other | Attending: Cardiology | Admitting: *Deleted

## 2022-07-29 DIAGNOSIS — Z5181 Encounter for therapeutic drug level monitoring: Secondary | ICD-10-CM

## 2022-07-29 DIAGNOSIS — Z954 Presence of other heart-valve replacement: Secondary | ICD-10-CM | POA: Diagnosis not present

## 2022-07-29 LAB — POCT INR: INR: 2.7 (ref 2.0–3.0)

## 2022-07-29 NOTE — Patient Instructions (Signed)
Continue warfarin 1 tablet daily except 2 tablets on Sundays, Tuesdays and Thursdays Continue greens Recheck in 4 weeks

## 2022-08-12 NOTE — Telephone Encounter (Signed)
Hello Gayle,  Yes, I would defer to PCP. If it is a significant amount of blood and symptomatic of anemia (SHOB, CP, fatigue, feeling lightheaded/dizzy), would send her to ED.   Thanks!   Best,  Finis Bud, NP

## 2022-08-15 ENCOUNTER — Encounter: Payer: Self-pay | Admitting: Cardiology

## 2022-08-26 ENCOUNTER — Ambulatory Visit: Payer: Medicaid Other | Attending: Cardiology | Admitting: *Deleted

## 2022-08-26 ENCOUNTER — Ambulatory Visit: Payer: Medicaid Other | Admitting: Nurse Practitioner

## 2022-08-26 ENCOUNTER — Encounter: Payer: Self-pay | Admitting: Nurse Practitioner

## 2022-08-26 VITALS — BP 112/74 | HR 82 | Ht 68.0 in | Wt 263.0 lb

## 2022-08-26 DIAGNOSIS — I5032 Chronic diastolic (congestive) heart failure: Secondary | ICD-10-CM | POA: Diagnosis not present

## 2022-08-26 DIAGNOSIS — I052 Rheumatic mitral stenosis with insufficiency: Secondary | ICD-10-CM

## 2022-08-26 DIAGNOSIS — Z5181 Encounter for therapeutic drug level monitoring: Secondary | ICD-10-CM | POA: Diagnosis not present

## 2022-08-26 DIAGNOSIS — Z954 Presence of other heart-valve replacement: Secondary | ICD-10-CM

## 2022-08-26 DIAGNOSIS — E785 Hyperlipidemia, unspecified: Secondary | ICD-10-CM

## 2022-08-26 DIAGNOSIS — E669 Obesity, unspecified: Secondary | ICD-10-CM

## 2022-08-26 LAB — POCT INR: POC INR: 2.9

## 2022-08-26 MED ORDER — METOLAZONE 2.5 MG PO TABS: 2.5000 mg | ORAL_TABLET | ORAL | 1 refills | Status: DC

## 2022-08-26 NOTE — Progress Notes (Signed)
Cardiology Office Note:    Date:  08/26/2022   ID:  Gloria James, DOB 1975/07/07, MRN [id number]  PCP:  Manon Hilding, MD   [place] [facility] Cardiologist:  Carlyle Dolly, MD     Referring MD: Manon Hilding, MD   CC: Here for follow-up  History of Present Illness:    Gloria James is a 47 y.o. female with a hx of the following:  HFpEF Rheumatic MS with regurgitation, s/p MVR with mechanical valve Palpitations Pulmonary nodule   Patient is a 47 year old female with past medical history as mentioned above.  In 2019 she underwent mitral valve replacement with mechanical valve.  Was placed on Coumadin.  In 2020 TTE revealed normal EF, trivial MR, with mean mitral valve gradient at 4 mmHg.  Last seen by Dr. Carlyle Dolly on February 48, 2023.  Did note some lower extremity edema.  Did also note some shortness of breath/DOE.  Torsemide was changed to 80 mg twice daily, was instructed she could take additional 20 mg as needed for swelling.  She was continued on all other medications.  Instructed to follow-up in 1 year.  [facility] ED visit on 08/14/2022 for acute on chronic systolic CHF. CXR showed pulmonary edema. Received ED diuresis. Labs and ECG were unremarkable.   Today she presents for follow-up. Doing better. Admits to URI symptoms and recent arm pain, attributes this to a pinched nerve. Denies any recent anginal chest pain, shortness of breath, palpitations, syncope, presyncope, dizziness, orthopnea, PND, swelling or significant weight changes, acute bleeding, or claudication.    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 12/22/2017   31 mm Sorin Carbomedics Optiform bileaflet mechanical valve via right mini thoracotomy approach   Tricuspid regurgitation     Past Surgical History:  Procedure Laterality Date   APPENDECTOMY     age 43   CARDIAC CATHETERIZATION  11/03/2017   MITRAL VALVE REPAIR Right 12/22/2017   Procedure: MINIMALLY INVASIVE MITRAL VALVE REPLACEMENT(MVR);  Surgeon: Rexene Alberts, MD;  Location: [place];  Service: Open Heart Surgery;  Laterality: Right;   MULTIPLE EXTRACTIONS WITH ALVEOLOPLASTY N/A 12/06/2017   Procedure: Extraction of tooth #'s 4-6, 8-12, 20,22-29 and 32 with alveoloplasty;  Surgeon: Lenn Cal, DDS;  Location: [place];  Service: Oral Surgery;  Laterality: N/A;   MULTIPLE TOOTH EXTRACTIONS  2015   OTHER SURGICAL HISTORY Left 2016   open surgery for kidney stones   RIGHT AND LEFT HEART CATH N/A 11/03/2017   Procedure: RIGHT AND LEFT HEART CATH;  Surgeon: Burnell Blanks, MD;  Location: [place] [facility];  Service: Cardiovascular;  Laterality: N/A;   TEE WITHOUT CARDIOVERSION N/A 11/03/2017   Procedure: TRANSESOPHAGEAL ECHOCARDIOGRAM (TEE);  Surgeon: Lelon Perla, MD;  Location: [facility] ENDOSCOPY;  Service: Cardiovascular;  Laterality: N/A;   TEE WITHOUT CARDIOVERSION N/A 12/22/2017   Procedure: TRANSESOPHAGEAL ECHOCARDIOGRAM (TEE);  Surgeon: Roxy Manns,  Valentina Gu, MD;  Location: [place];  Service: Open Heart Surgery;  Laterality: N/A;   TONSILLECTOMY     6    Current Medications: Current Meds  Medication Sig   albuterol (VENTOLIN HFA) 108 (90 Base) MCG/ACT inhaler Inhale 2 puffs into the lungs every 4 (four) hours as needed for wheezing or shortness of breath.   ALPRAZolam (XANAX) 0.5 MG tablet Take 0.5 mg by mouth 4 (four) times daily as needed for anxiety.    aspirin EC 81 MG tablet Take 1 tablet  (81 mg total) by mouth daily.   benzonatate (TESSALON) 100 MG capsule Take 100 mg by mouth 3 (three) times daily as needed for cough.   diclofenac Sodium (VOLTAREN) 1 % GEL Apply topically.   fluticasone (FLONASE) 50 MCG/ACT nasal spray Place 2 sprays into both nostrils daily as needed for allergies.    metoprolol tartrate (LOPRESSOR) 25 MG tablet Take 1 tablet (25 mg total) by mouth 2 (two) times daily.   potassium chloride SA (K-DUR) 20 MEQ tablet TAKE 2 TABLETS BY MOUTH ONCE DAILY.   sertraline (ZOLOFT) 100 MG tablet Take 200 mg by mouth daily.   torsemide (DEMADEX) 20 MG tablet Take 80 mg by mouth 2 (two) times daily.   traZODone (DESYREL) 100 MG tablet Take 200 mg by mouth at bedtime.    warfarin (COUMADIN) 2 MG tablet TAKE 2 TABLETS ONCE A DAY OR AS DIRECTED BY COUMADIN CLINIC.     Allergies:   Patient has no known allergies.   Social History   Socioeconomic History   Marital status: Married    Spouse name: Not on file   Number of children: Not on file   Years of education: Not on file   Highest education level: Not on file  Occupational History   Not on file  Tobacco Use   Smoking status: Former    Packs/day: 0.50    Years: 26.00    Total pack years: 13.00    Types: Cigarettes    Start date: 12/18/1991    Quit date: 09/23/2017    Years since quitting: 4.9   Smokeless tobacco: Never  Vaping Use   Vaping Use: Former  Substance and Sexual Activity   Alcohol use: Not Currently   Drug use: Never   Sexual activity: Yes    Birth control/protection: I.U.D.  Other Topics Concern   Not on file  Social History Narrative   Not on file   Social Determinants of Health   Financial Resource Strain: Not on file  Food Insecurity: Not on file  Transportation Needs: Not on file  Physical Activity: Not on file  Stress: Not on file  Social Connections: Not on file     Family History: The patient's family history includes Cancer in her father, paternal grandfather, and paternal  grandmother; Dementia in her maternal grandmother; Heart disease in her maternal grandfather.  ROS:     Please see the history of present illness.    All other systems reviewed and are negative.  EKGs/Labs/Other Studies Reviewed:    The following studies were reviewed today:   EKG:  EKG is not ordered today.    Echo 07/11/2022: 1. Left ventricular ejection fraction, by estimation, is 60 to 65%. The  left ventricle has normal function. The left ventricle has no regional  wall motion abnormalities. Left ventricular diastolic parameters are  indeterminate. The average left  ventricular global longitudinal strain is -21.9 %. The global longitudinal  strain is normal.  2. Right ventricular systolic function is normal. The right ventricular  size is normal. Tricuspid regurgitation signal is inadequate for assessing  PA pressure.   3. Left atrial size was mildly dilated.   4. Sorin carbomedical optiform bileaflet mechanical valve 31 mm is in the  MV position. The mitral valve has been repaired/replaced. No evidence of  mitral valve regurgitation. No evidence of mitral stenosis.   5. The tricuspid valve is abnormal.   6. The aortic valve has an indeterminant number of cusps. Aortic valve  regurgitation is not visualized. No aortic stenosis is present.   Comparison(s): Echocardiogram done 07/27/18 showed an EF of 50-55%.  Echocardiogram on July 27, 2018:  1. The left ventricle has The cavity size is normal. There is no left  ventricular wall thickness. Indeterminate diastolic filling patterns. The  left ventricular diastology could not be evaluated due to mitral valve  replacement/repair. LVEF 50 to 55%.  2. A 31 Sorin Carbomedics mechanical valve is present in the mitral  position.   3. Normal tricuspid valve.   4. The aortic valve tricuspid. There ismild calcification of the aortic  valve.   5. The aortic root is normal is size and structure.   6. Normal right atrial size.    7. Upper normal left atrial size.   8. The interatrial septum appears to be lipomatous.   9. No atrial level shunt detected by color flow Doppler.  10. The mitral valve has been replaced. Regurgitation is trivial by color  flow Doppler.  Right and left heart cath on Nov 03, 2017: 1. NO angiographic evidence of CAD 2. Large v-wave on wedge tracing c/w severe MR   Recommendations: Continue workup for surgical MV replacement/repair. I do not think she will be a candidate for balloon valvuloplasty given her severe MR.   Recent Labs: No results found for requested labs within last 365 days.  Recent Lipid Panel No results found for: "CHOL", "TRIG", "HDL", "CHOLHDL", "VLDL", "LDLCALC", "LDLDIRECT"   Physical Exam:    VS:  BP 112/74   Pulse 82   Ht '5\' 8"'$  (1.727 m)   Wt 263 lb (119.3 kg)   SpO2 99%   BMI 39.99 kg/m     Wt Readings from Last 3 Encounters:  08/26/22 263 lb (119.3 kg)  06/30/22 262 lb (118.8 kg)  08/04/21 252 lb (114.3 kg)     GEN: Obese, 47 y.o. female  in no acute distress HEENT: Normal NECK: No JVD; No carotid bruits CARDIAC: RRR, no murmurs, rubs, gallops RESPIRATORY S1/S2,  diminished, coarse breath sounds to auscultation, congested/nonproductive cough, no rhonchi noted MUSCULOSKELETAL: no leg edema; No deformity  SKIN: Warm and dry NEUROLOGIC:  Alert and oriented x 3 PSYCHIATRIC:  Normal affect   ASSESSMENT:    1. Chronic heart failure with preserved ejection fraction ([place])   2. Rheumatic mitral stenosis with regurgitation   3. 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 Hyperlipidemia, unspecified hyperlipidemia type   5. Obesity (BMI 30-39.9)     PLAN:    In order of problems listed above:  HFpEF TTE 06/2022 overall unremarkable. Euvolemic and well compensated on exam. Continue Torsemide and may take extra 20 mg daily as needed for swelling. Will write Rx for metolazone PRN for swelling, weight gain, or  shortness of breath. Low sodium diet, fluid restriction <2L, and daily weights encouraged. Educated to contact our office for weight gain of 2 lbs overnight or 5 lbs in one  week. Heart healthy diet and regular cardiovascular exercise encouraged. Recommended to follow up with PCP regarding chest congestion, may likely have URI.   Rheumatic MS with regurgitation, s/p MVR with mechanical valve Valve functioning appropriately on exam and on Echo 06/2022. Continue follow up with [facility]. Care precautions and ED precautions discussed. Heart healthy diet and regular cardiovascular exercise encouraged.   HLD Recent lipid panel revealed LDL 145. Not on any lipid lowering medications at this time. Will route note to PCP to recommend starting dose statin, such as Crestor 20 mg daily. Heart healthy diet and regular cardiovascular exercise encouraged.   Obesity BMI today 39.99. Weight loss via diet and exercise encouraged. Discussed the impact being overweight would have on cardiovascular risk. Heart healthy diet and regular cardiovascular exercise encouraged.     5. Disposition: Follow up with me or APP in 3 months and Dr. Carlyle Dolly in 6 months.   Medication Adjustments/Labs and Tests Ordered: Current medicines are reviewed at length with the patient today.  Concerns regarding medicines are outlined above.  No orders of the defined types were placed in this encounter.  Meds ordered this encounter  Medications   metolazone (ZAROXOLYN) 2.5 MG tablet    Sig: Take 1 tablet (2.5 mg total) by mouth once a week. As needed (TAKE 30 MINUTES PRIOR TO TAKING TORSEMIDE)    Dispense:  12 tablet    Refill:  1    08/26/22 New Start    Patient Instructions  Medication Instructions:  Your physician has recommended you make the following change in your medication:  Start metolazone 2.5 mg once a week as needed. TAKE 30 MINUTES PRIOR TO TAKING TORSEMIDE. Continue all other medications as  directed  Labwork: NONE  Testing/Procedures: none  Follow-Up:  Your physician recommends that you schedule a follow-up appointment in: 3 months  Any Other Special Instructions Will Be Listed Below (If Applicable).  If you need a refill on your cardiac medications before your next appointment, please call your pharmacy.    Signed, Finis Bud, NP  08/27/2022 3:51 PM    [place]

## 2022-08-26 NOTE — Patient Instructions (Addendum)
Medication Instructions:  Your physician has recommended you make the following change in your medication:  Start metolazone 2.5 mg once a week as needed. TAKE 30 MINUTES PRIOR TO TAKING TORSEMIDE. Continue all other medications as directed  Labwork: NONE  Testing/Procedures: none  Follow-Up:  Your physician recommends that you schedule a follow-up appointment in: 3 months  Any Other Special Instructions Will Be Listed Below (If Applicable).  If you need a refill on your cardiac medications before your next appointment, please call your pharmacy.

## 2022-08-26 NOTE — Patient Instructions (Signed)
Description   Continue warfarin 1 tablet daily except 2 tablets on Sundays, Tuesdays and Thursdays Continue greens Recheck in 5 weeks

## 2022-09-06 NOTE — Progress Notes (Signed)
Last office note sent to Dr. Quintin Alto.

## 2022-09-28 ENCOUNTER — Ambulatory Visit: Payer: Medicaid Other | Admitting: Nurse Practitioner

## 2022-10-05 ENCOUNTER — Ambulatory Visit: Payer: Medicaid Other | Attending: Cardiology | Admitting: *Deleted

## 2022-10-05 DIAGNOSIS — Z954 Presence of other heart-valve replacement: Secondary | ICD-10-CM | POA: Diagnosis not present

## 2022-10-05 DIAGNOSIS — Z5181 Encounter for therapeutic drug level monitoring: Secondary | ICD-10-CM

## 2022-10-05 LAB — POCT INR: INR: 2.5 (ref 2.0–3.0)

## 2022-10-05 NOTE — Patient Instructions (Signed)
Continue warfarin 1 tablet daily except 2 tablets on Sundays, Tuesdays and Thursdays Continue greens Recheck in 6 weeks

## 2022-10-21 ENCOUNTER — Ambulatory Visit: Payer: Medicaid Other | Admitting: Cardiology

## 2022-11-05 ENCOUNTER — Other Ambulatory Visit: Payer: Self-pay | Admitting: Cardiology

## 2022-11-05 DIAGNOSIS — Z5181 Encounter for therapeutic drug level monitoring: Secondary | ICD-10-CM

## 2022-11-08 ENCOUNTER — Other Ambulatory Visit: Payer: Self-pay | Admitting: Cardiology

## 2022-11-08 DIAGNOSIS — Z5181 Encounter for therapeutic drug level monitoring: Secondary | ICD-10-CM

## 2022-11-08 NOTE — Telephone Encounter (Signed)
Refill request for Warfarin:  Last INR was 2.5 on 10/05/22 Next INR due 11/16/22 LOV was 08/26/22  Shawnie Dapper NP  Refill approved.

## 2022-11-16 ENCOUNTER — Ambulatory Visit: Payer: Medicaid Other | Attending: Cardiology | Admitting: *Deleted

## 2022-11-16 DIAGNOSIS — Z954 Presence of other heart-valve replacement: Secondary | ICD-10-CM

## 2022-11-16 DIAGNOSIS — Z5181 Encounter for therapeutic drug level monitoring: Secondary | ICD-10-CM | POA: Diagnosis not present

## 2022-11-16 LAB — POCT INR: INR: 1.2 — AB (ref 2.0–3.0)

## 2022-11-16 NOTE — Patient Instructions (Signed)
Take warfarin 3 tablets x 3 days then resume 1 tablet daily except 2 tablets on Sundays, Tuesdays and Thursdays Continue greens Recheck in 1 week

## 2022-11-24 ENCOUNTER — Ambulatory Visit: Payer: Medicaid Other | Admitting: Nurse Practitioner

## 2022-11-24 ENCOUNTER — Ambulatory Visit: Payer: Medicaid Other | Attending: Cardiology | Admitting: *Deleted

## 2022-11-24 DIAGNOSIS — Z954 Presence of other heart-valve replacement: Secondary | ICD-10-CM

## 2022-11-24 DIAGNOSIS — Z5181 Encounter for therapeutic drug level monitoring: Secondary | ICD-10-CM | POA: Diagnosis not present

## 2022-11-24 LAB — POCT INR: INR: 2.8 (ref 2.0–3.0)

## 2022-11-24 NOTE — Patient Instructions (Signed)
Continue warfarin 1 tablet daily except 2 tablets on Sundays, Tuesdays and Thursdays Continue greens Recheck in 4 weeks 

## 2023-01-04 ENCOUNTER — Ambulatory Visit: Payer: Medicaid Other | Attending: Cardiology | Admitting: *Deleted

## 2023-01-04 DIAGNOSIS — Z954 Presence of other heart-valve replacement: Secondary | ICD-10-CM

## 2023-01-04 DIAGNOSIS — Z5181 Encounter for therapeutic drug level monitoring: Secondary | ICD-10-CM

## 2023-01-04 LAB — POCT INR: INR: 1.3 — AB (ref 2.0–3.0)

## 2023-01-04 NOTE — Patient Instructions (Signed)
Take warfarin 3 tablets tonight and tomorrow night then resume 1 tablet daily except 2 tablets on Sundays, Tuesdays and Thursdays Continue greens Recheck in 1 week

## 2023-01-11 ENCOUNTER — Ambulatory Visit: Payer: Medicaid Other | Attending: Cardiology | Admitting: *Deleted

## 2023-01-11 DIAGNOSIS — Z5181 Encounter for therapeutic drug level monitoring: Secondary | ICD-10-CM | POA: Diagnosis not present

## 2023-01-11 DIAGNOSIS — Z954 Presence of other heart-valve replacement: Secondary | ICD-10-CM

## 2023-01-11 LAB — POCT INR: INR: 3 (ref 2.0–3.0)

## 2023-01-11 NOTE — Patient Instructions (Signed)
Continue warfarin 1 tablet daily except 2 tablets on Sundays, Tuesdays and Thursdays Continue greens Recheck in 3 week Wt: 269.6

## 2023-01-19 ENCOUNTER — Encounter (HOSPITAL_COMMUNITY): Payer: Self-pay | Admitting: Emergency Medicine

## 2023-01-19 ENCOUNTER — Other Ambulatory Visit: Payer: Self-pay

## 2023-01-19 ENCOUNTER — Inpatient Hospital Stay (HOSPITAL_COMMUNITY)
Admission: EM | Admit: 2023-01-19 | Discharge: 2023-01-21 | DRG: 641 | Disposition: A | Discharge status: Home / Self Care | Payer: Medicaid Other | Attending: Internal Medicine | Admitting: Internal Medicine

## 2023-01-19 DIAGNOSIS — E669 Obesity, unspecified: Secondary | ICD-10-CM | POA: Diagnosis present

## 2023-01-19 DIAGNOSIS — Z954 Presence of other heart-valve replacement: Secondary | ICD-10-CM

## 2023-01-19 DIAGNOSIS — Z8249 Family history of ischemic heart disease and other diseases of the circulatory system: Secondary | ICD-10-CM

## 2023-01-19 DIAGNOSIS — Z7901 Long term (current) use of anticoagulants: Secondary | ICD-10-CM | POA: Diagnosis not present

## 2023-01-19 DIAGNOSIS — E876 Hypokalemia: Secondary | ICD-10-CM | POA: Diagnosis not present

## 2023-01-19 DIAGNOSIS — R791 Abnormal coagulation profile: Secondary | ICD-10-CM | POA: Diagnosis present

## 2023-01-19 DIAGNOSIS — Z952 Presence of prosthetic heart valve: Secondary | ICD-10-CM

## 2023-01-19 DIAGNOSIS — T502X5A Adverse effect of carbonic-anhydrase inhibitors, benzothiadiazides and other diuretics, initial encounter: Secondary | ICD-10-CM | POA: Diagnosis present

## 2023-01-19 DIAGNOSIS — Z87891 Personal history of nicotine dependence: Secondary | ICD-10-CM

## 2023-01-19 DIAGNOSIS — R5383 Other fatigue: Secondary | ICD-10-CM | POA: Diagnosis present

## 2023-01-19 DIAGNOSIS — Z6839 Body mass index (BMI) 39.0-39.9, adult: Secondary | ICD-10-CM

## 2023-01-19 DIAGNOSIS — Z9049 Acquired absence of other specified parts of digestive tract: Secondary | ICD-10-CM

## 2023-01-19 DIAGNOSIS — Z87442 Personal history of urinary calculi: Secondary | ICD-10-CM

## 2023-01-19 DIAGNOSIS — I5032 Chronic diastolic (congestive) heart failure: Secondary | ICD-10-CM | POA: Diagnosis present

## 2023-01-19 DIAGNOSIS — I052 Rheumatic mitral stenosis with insufficiency: Secondary | ICD-10-CM | POA: Diagnosis not present

## 2023-01-19 DIAGNOSIS — Z79899 Other long term (current) drug therapy: Secondary | ICD-10-CM

## 2023-01-19 DIAGNOSIS — I081 Rheumatic disorders of both mitral and tricuspid valves: Secondary | ICD-10-CM | POA: Diagnosis present

## 2023-01-19 DIAGNOSIS — Z7982 Long term (current) use of aspirin: Secondary | ICD-10-CM

## 2023-01-19 LAB — CBC WITH DIFFERENTIAL/PLATELET
Abs Immature Granulocytes: 0.02 10*3/uL (ref 0.00–0.07)
Basophils Absolute: 0 10*3/uL (ref 0.0–0.1)
Basophils Relative: 0 %
Eosinophils Absolute: 0.1 10*3/uL (ref 0.0–0.5)
Eosinophils Relative: 1 %
HCT: 34.6 % — ABNORMAL LOW (ref 36.0–46.0)
Hemoglobin: 10.2 g/dL — ABNORMAL LOW (ref 12.0–15.0)
Immature Granulocytes: 0 %
Lymphocytes Relative: 9 %
Lymphs Abs: 1 10*3/uL (ref 0.7–4.0)
MCH: 21.4 pg — ABNORMAL LOW (ref 26.0–34.0)
MCHC: 29.5 g/dL — ABNORMAL LOW (ref 30.0–36.0)
MCV: 72.7 fL — ABNORMAL LOW (ref 80.0–100.0)
Monocytes Absolute: 0.4 10*3/uL (ref 0.1–1.0)
Monocytes Relative: 4 %
Neutro Abs: 9.1 10*3/uL — ABNORMAL HIGH (ref 1.7–7.7)
Neutrophils Relative %: 86 %
Platelets: 298 10*3/uL (ref 150–400)
RBC: 4.76 MIL/uL (ref 3.87–5.11)
RDW: 16.7 % — ABNORMAL HIGH (ref 11.5–15.5)
WBC: 10.6 10*3/uL — ABNORMAL HIGH (ref 4.0–10.5)
nRBC: 0 % (ref 0.0–0.2)

## 2023-01-19 LAB — COMPREHENSIVE METABOLIC PANEL
ALT: 24 U/L (ref 0–44)
AST: 28 U/L (ref 15–41)
Albumin: 4 g/dL (ref 3.5–5.0)
Alkaline Phosphatase: 56 U/L (ref 38–126)
Anion gap: 12 (ref 5–15)
BUN: 23 mg/dL — ABNORMAL HIGH (ref 6–20)
CO2: 32 mmol/L (ref 22–32)
Calcium: 9.6 mg/dL (ref 8.9–10.3)
Chloride: 89 mmol/L — ABNORMAL LOW (ref 98–111)
Creatinine, Ser: 1.02 mg/dL — ABNORMAL HIGH (ref 0.44–1.00)
GFR, Estimated: >60 mL/min (ref >60)
Glucose, Bld: 177 mg/dL — ABNORMAL HIGH (ref 70–99)
Potassium: 2.1 mmol/L — CL (ref 3.5–5.1)
Sodium: 133 mmol/L — ABNORMAL LOW (ref 135–145)
Total Bilirubin: 0.4 mg/dL (ref 0.3–1.2)
Total Protein: 7.7 g/dL (ref 6.5–8.1)

## 2023-01-19 LAB — POTASSIUM: Potassium: 2 mmol/L — CL (ref 3.5–5.1)

## 2023-01-19 LAB — PROTIME-INR
INR: 2.1 — ABNORMAL HIGH (ref 0.8–1.2)
Prothrombin Time: 23.6 seconds — ABNORMAL HIGH (ref 11.4–15.2)

## 2023-01-19 LAB — MAGNESIUM: Magnesium: 2 mg/dL (ref 1.7–2.4)

## 2023-01-19 MED ORDER — CHLORHEXIDINE GLUCONATE CLOTH 2 % EX PADS
6.0000 | MEDICATED_PAD | Freq: Every day | CUTANEOUS | Status: DC
  Administered 2023-01-19 – 2023-01-20 (×2): 6 via TOPICAL

## 2023-01-19 MED ORDER — POTASSIUM CHLORIDE 10 MEQ/100ML IV SOLN
10.0000 meq | INTRAVENOUS | Status: AC
  Administered 2023-01-19 (×4): 10 meq via INTRAVENOUS
  Filled 2023-01-19 (×3): qty 100

## 2023-01-19 MED ORDER — WARFARIN SODIUM 2 MG PO TABS
4.0000 mg | ORAL_TABLET | Freq: Once | ORAL | Status: AC
  Administered 2023-01-19: 4 mg via ORAL
  Filled 2023-01-19: qty 2

## 2023-01-19 MED ORDER — POTASSIUM CHLORIDE CRYS ER 20 MEQ PO TBCR
40.0000 meq | EXTENDED_RELEASE_TABLET | ORAL | Status: AC
  Administered 2023-01-19 – 2023-01-20 (×3): 40 meq via ORAL
  Filled 2023-01-19 (×3): qty 2

## 2023-01-19 MED ORDER — POTASSIUM CHLORIDE 10 MEQ/100ML IV SOLN
10.0000 meq | INTRAVENOUS | Status: AC
  Administered 2023-01-19 (×2): 10 meq via INTRAVENOUS
  Filled 2023-01-19 (×2): qty 100

## 2023-01-19 MED ORDER — FLUTICASONE PROPIONATE 50 MCG/ACT NA SUSP: 2.0000 | Freq: Every day | NASAL | Status: DC | PRN

## 2023-01-19 MED ORDER — POLYETHYLENE GLYCOL 3350 17 G PO PACK: 17.0000 g | PACK | Freq: Every day | ORAL | Status: DC | PRN

## 2023-01-19 MED ORDER — TORSEMIDE 20 MG PO TABS: 80.0000 mg | ORAL_TABLET | Freq: Two times a day (BID) | ORAL | Status: DC

## 2023-01-19 MED ORDER — WARFARIN - PHARMACIST DOSING INPATIENT: Freq: Every day | Status: DC

## 2023-01-19 MED ORDER — ENOXAPARIN SODIUM 120 MG/0.8ML IJ SOSY
1.0000 mg/kg | PREFILLED_SYRINGE | Freq: Once | INTRAMUSCULAR | Status: AC
  Administered 2023-01-19: 120 mg via SUBCUTANEOUS
  Filled 2023-01-19: qty 0.8

## 2023-01-19 MED ORDER — ACETAMINOPHEN 325 MG PO TABS: 650.0000 mg | ORAL_TABLET | Freq: Four times a day (QID) | ORAL | Status: DC | PRN

## 2023-01-19 MED ORDER — POTASSIUM CHLORIDE CRYS ER 20 MEQ PO TBCR
40.0000 meq | EXTENDED_RELEASE_TABLET | Freq: Once | ORAL | Status: AC
  Administered 2023-01-19: 40 meq via ORAL
  Filled 2023-01-19: qty 2

## 2023-01-19 MED ORDER — METOPROLOL TARTRATE 25 MG PO TABS
25.0000 mg | ORAL_TABLET | Freq: Two times a day (BID) | ORAL | Status: DC
  Administered 2023-01-19 – 2023-01-21 (×4): 25 mg via ORAL
  Filled 2023-01-19 (×4): qty 1

## 2023-01-19 MED ORDER — ALPRAZOLAM 0.5 MG PO TABS
0.5000 mg | ORAL_TABLET | Freq: Four times a day (QID) | ORAL | Status: DC | PRN
  Administered 2023-01-19 – 2023-01-20 (×2): 0.5 mg via ORAL
  Filled 2023-01-19 (×2): qty 1

## 2023-01-19 MED ORDER — MORPHINE SULFATE (PF) 2 MG/ML IV SOLN
2.0000 mg | Freq: Once | INTRAVENOUS | Status: AC
  Administered 2023-01-19: 2 mg via INTRAVENOUS
  Filled 2023-01-19: qty 1

## 2023-01-19 MED ORDER — ACETAMINOPHEN 650 MG RE SUPP: 650.0000 mg | Freq: Four times a day (QID) | RECTAL | Status: DC | PRN

## 2023-01-19 NOTE — Assessment & Plan Note (Addendum)
Daily PT/INR - [place] per pharmacy- INR subtherapeutic- 2.1, recommends starting Lovenox, bridge to warfarin to therapeutic INR 2.5-3.5- considering indication of mechanical valve.

## 2023-01-19 NOTE — Assessment & Plan Note (Addendum)
Potassium 2.1.  Magnesium 2.  Likely 2/2 diuretics, reports temporary increased dose a few days ago, she is now back to normal dose..  She reports compliance with K supplementation. -Please K IV and oral

## 2023-01-19 NOTE — ED Provider Notes (Signed)
[place] EMERGENCY DEPARTMENT AT [facility] Provider Note   CSN: [id number] Arrival date & time: 01/19/23  1111     History  Chief Complaint  Patient presents with   abnormal labs    Gloria James is a 47 y.o. female.  HPI      Gloria James is a 47 y.o. female with past medical history of pulmonary edema, tricuspid regurgitation, CHF, and prior mitral valve replacement with mechanical valve who presents to the Emergency Department complaining of fatigue and intermittent cramping and states her potassium level is low.  Was seen by her PCP and was recommended to come to the ER for potassium of 2.6.  She denies any chest pain or shortness of breath.  She does take Zaroxolyn and furosemide and states her PCP increased her Zaroxolyn dose last week due to complaint of shortness of breath and edema.  She does take oral potassium daily   Home Medications Prior to Admission medications   Medication Sig Start Date End Date Taking? Authorizing Provider  albuterol (VENTOLIN HFA) 108 (90 Base) MCG/ACT inhaler Inhale 2 puffs into the lungs every 4 (four) hours as needed for wheezing or shortness of breath.    [provider]  ALPRAZolam Prudy Feeler) 0.5 MG tablet Take 0.5 mg by mouth 4 (four) times daily as needed for anxiety.     [provider]  aspirin EC 81 MG tablet Take 1 tablet (81 mg total) by mouth daily. 01/13/18   Antoine Poche, MD  benzonatate (TESSALON) 100 MG capsule Take 100 mg by mouth 3 (three) times daily as needed for cough.    [provider]  diclofenac Sodium (VOLTAREN) 1 % GEL Apply topically. 08/14/22 08/14/23  [provider]  fluticasone (FLONASE) 50 MCG/ACT nasal spray Place 2 sprays into both nostrils daily as needed for allergies.     [provider]  metolazone (ZAROXOLYN) 2.5 MG tablet Take 1 tablet (2.5 mg total) by mouth once a week. As needed (TAKE 30 MINUTES PRIOR TO TAKING TORSEMIDE) 08/26/22   Sharlene Dory, NP  metoprolol tartrate (LOPRESSOR) 25 MG tablet Take 1 tablet (25 mg total) by mouth 2 (two) times daily. 02/23/18   Antoine Poche, MD  potassium chloride SA (K-DUR) 20 MEQ tablet TAKE 2 TABLETS BY MOUTH ONCE DAILY. 03/26/19   Antoine Poche, MD  sertraline (ZOLOFT) 100 MG tablet Take 200 mg by mouth daily.    [provider]  torsemide (DEMADEX) 20 MG tablet Take 80 mg by mouth 2 (two) times daily.    [provider]  traZODone (DESYREL) 100 MG tablet Take 200 mg by mouth at bedtime.     [provider]  warfarin (COUMADIN) 2 MG tablet TAKE 2 TABLETS ONCE A DAY OR AS DIRECTED BY COUMADIN CLINIC. 11/08/22   Antoine Poche, MD      Allergies    Patient has no known allergies.    Review of Systems   Review of Systems  Constitutional:  Positive for fatigue. Negative for chills, diaphoresis and fever.  Respiratory:  Negative for chest tightness and shortness of breath.   Cardiovascular:  Negative for chest pain.  Gastrointestinal:  Negative for nausea and vomiting.  Musculoskeletal:  Positive for myalgias (Muscle cramping and myalgias).  Skin:  Negative for rash.  Neurological:  Negative for dizziness, syncope, light-headedness and headaches.  Psychiatric/Behavioral:  Negative for confusion.     Physical Exam Updated Vital Signs BP 106/62  Pulse 90   Temp 98.3 F (36.8 C)   Resp 13   SpO2 100%  Physical Exam Vitals and nursing note reviewed.  Constitutional:      General: She is not in acute distress.    Appearance: Normal appearance. She is not toxic-appearing.  HENT:     Mouth/Throat:     Mouth: Mucous membranes are moist.  Cardiovascular:     Rate and Rhythm: Normal rate and regular rhythm.     Pulses: Normal pulses.  Pulmonary:     Effort: Pulmonary effort is normal.  Chest:     Chest wall: No tenderness.  Abdominal:     Palpations: Abdomen is soft.     Tenderness: There is no abdominal tenderness.  Musculoskeletal:      Right lower leg: No edema.     Left lower leg: No edema.  Skin:    General: Skin is warm.     Capillary Refill: Capillary refill takes less than 2 seconds.     Findings: No erythema or rash.  Neurological:     General: No focal deficit present.     Mental Status: She is alert.     Sensory: No sensory deficit.     Motor: No weakness.     ED Results / Procedures / Treatments   Labs (all labs ordered are listed, but only abnormal results are displayed) Labs Reviewed  CBC WITH DIFFERENTIAL/PLATELET - Abnormal; Notable for the following components:      Result Value   WBC 10.6 (*)    Hemoglobin 10.2 (*)    HCT 34.6 (*)    MCV 72.7 (*)    MCH 21.4 (*)    MCHC 29.5 (*)    RDW 16.7 (*)    Neutro Abs 9.1 (*)    All other components within normal limits  COMPREHENSIVE METABOLIC PANEL - Abnormal; Notable for the following components:   Sodium 133 (*)    Potassium 2.1 (*)    Chloride 89 (*)    Glucose, Bld 177 (*)    BUN 23 (*)    Creatinine, Ser 1.02 (*)    All other components within normal limits  MAGNESIUM    EKG EKG Interpretation Date/Time:  Wednesday January 19 2023 11:29:06 EDT Ventricular Rate:  90 PR Interval:  198 QRS Duration:  92 QT Interval:  420 QTC Calculation: 513 R Axis:   53  Text Interpretation: Normal sinus rhythm Prolonged QT Abnormal ECG No significant change since last tracing Confirmed by Linwood Dibbles [phone]) on 01/19/2023 11:43:30 AM  Radiology No results found.  Procedures Procedures    Medications Ordered in ED Medications  potassium chloride 10 mEq in 100 mL IVPB (10 mEq Intravenous New Bag/Given 01/19/23 1545)  potassium chloride SA (KLOR-CON M) CR tablet 40 mEq (40 mEq Oral Given 01/19/23 1317)    ED Course/ Medical Decision Making/ A&P                             Medical Decision Making Patient with history of CHF on furosemide and Zaroxolyn here for fatigue and intermittent muscle cramps.  States she was told by her PCP that her  potassium level was low and recommended to come to the ER for further evaluation.  Her Zaroxolyn dose was temporarily increased last week due to complaint of edema.  She denies any chest pain or increased edema at this time.  I suspect patient's hypokalemia due to diuretic  use.  Will recheck labs, EKG and administer IV potassium  Amount and/or Complexity of Data Reviewed Labs: ordered.    Details: Labs interpreted by me, no significant leukocytosis, chemistries show potassium of 2.1, magnesium unremarkable. ECG/medicine tests: ordered.    Details: EKG shows normal sinus rhythm with prolonged QT Discussion of management or test interpretation with external provider(s): Oral and IV potassium ordered, will need hospital admission for her significant hypokalemia.  She is agreeable to plan.  Discussed findings with [facility] hospitalist, Dr. Wendall Stade who is agreeable to admit  Risk Prescription drug management.           Final Clinical Impression(s) / ED Diagnoses Final diagnoses:  Hypokalemia    Rx / DC Orders ED Discharge Orders     None         Pauline Aus, PA-C 01/19/23 1601    Linwood Dibbles, MD 01/20/23 [phone]

## 2023-01-19 NOTE — Assessment & Plan Note (Addendum)
Stable and compensated.  Last echo 06/2022, EF of 60 to 65%. -Unable to confirm medication dose and freq of diuretics from patient, await med rec -For now resume torsemide 80 mg in a.m.

## 2023-01-19 NOTE — H&P (Addendum)
History and Physical    ARVADA SEABORN FIE:[id number] DOB: September 23, 1975 DOA: 01/19/2023  PCP: Estanislado Pandy, MD   Patient coming from: Home  I have personally briefly reviewed patient's old medical records in [facility]  Chief Complaint: Low Potassium  HPI: Gloria James is a 47 y.o. female with medical history significant for diastolic CHF, mechanical heart valve on chronic anticoagulation with warfarin,. Patient was referred to the ED from outpatient providers office with reports of low potassium.  Patient reports some cramping, and fatigue.  She otherwise denies difficulty breathing, no chest pain, no lower extremity swelling.  She was recently placed on an increased dose of her diuretic for about 4 days, and she stopped this about 4 days ago.  She has been compliant with oral potassium supplements at home.  Vomiting no loose stools, she reports good oral intake.  ED Course: Stable vitals.  Potassium 2.1.  EKG shows sinus rhythm rate 90, QTc prolonged at 513.  Magnesium normal at 2. IV and oral Potassium replacement started.  Review of Systems: As per HPI all other systems reviewed and negative.  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   31 mm Sorin Carbomedics Optiform bileaflet mechanical valve via right mini thoracotomy approach   Tricuspid regurgitation     Past Surgical History:  Procedure Laterality Date   APPENDECTOMY     age 67   CARDIAC CATHETERIZATION   11/03/2017   MITRAL VALVE REPAIR Right 12/22/2017   Procedure: MINIMALLY INVASIVE MITRAL VALVE REPLACEMENT(MVR);  Surgeon: Purcell Nails, MD;  Location: [facility] OR;  Service: Open Heart Surgery;  Laterality: Right;   MULTIPLE EXTRACTIONS WITH ALVEOLOPLASTY N/A 12/06/2017   Procedure: Extraction of tooth #'s 4-6, 8-12, 20,22-29 and 32 with alveoloplasty;  Surgeon: Charlynne Pander, DDS;  Location: [facility] OR;  Service: Oral Surgery;  Laterality: N/A;   MULTIPLE TOOTH EXTRACTIONS  2015   OTHER SURGICAL HISTORY Left 2016   open surgery for kidney stones   RIGHT AND LEFT HEART CATH N/A 11/03/2017   Procedure: RIGHT AND LEFT HEART CATH;  Surgeon: Kathleene Hazel, MD;  Location: [facility] INVASIVE CV LAB;  Service: Cardiovascular;  Laterality: N/A;   TEE WITHOUT CARDIOVERSION N/A 11/03/2017   Procedure: TRANSESOPHAGEAL ECHOCARDIOGRAM (TEE);  Surgeon: Lewayne Bunting, MD;  Location: [facility] ENDOSCOPY;  Service: Cardiovascular;  Laterality: N/A;   TEE WITHOUT CARDIOVERSION N/A 12/22/2017   Procedure: TRANSESOPHAGEAL ECHOCARDIOGRAM (TEE);  Surgeon: Purcell Nails, MD;  Location: [facility] OR;  Service: Open Heart Surgery;  Laterality: N/A;   TONSILLECTOMY     6     reports that she quit smoking about 5 years ago. Her smoking use included cigarettes. She started smoking about 31 years ago. She has a 13 pack-year smoking history. She has never used smokeless tobacco. She reports that she does not currently use alcohol. She reports that she does not use drugs.  No  Known Allergies  Family History  Problem Relation Age of Onset   Cancer Father    Dementia Maternal Grandmother    Heart disease Maternal Grandfather        had open heart problems   Cancer Paternal Grandmother    Cancer Paternal Grandfather     Prior to Admission medications   Medication Sig Start Date End Date Taking? Authorizing Provider  albuterol (VENTOLIN HFA) 108 (90 Base) MCG/ACT inhaler Inhale 2 puffs into the lungs every 4 (four) hours as  needed for wheezing or shortness of breath.    [provider]  ALPRAZolam Prudy Feeler) 0.5 MG tablet Take 0.5 mg by mouth 4 (four) times daily as needed for anxiety.     [provider]  aspirin EC 81 MG tablet Take 1 tablet (81 mg total) by mouth daily. 01/13/18   Antoine Poche, MD  benzonatate (TESSALON) 100 MG capsule Take 100 mg by mouth 3 (three) times daily as needed for cough.    [provider]  diclofenac Sodium (VOLTAREN) 1 % GEL Apply topically. 08/14/22 08/14/23  [provider]  fluticasone (FLONASE) 50 MCG/ACT nasal spray Place 2 sprays into both nostrils daily as needed for allergies.     [provider]  metolazone (ZAROXOLYN) 2.5 MG tablet Take 1 tablet (2.5 mg total) by mouth once a week. As needed (TAKE 30 MINUTES PRIOR TO TAKING TORSEMIDE) 08/26/22   Sharlene Dory, NP  metoprolol tartrate (LOPRESSOR) 25 MG tablet Take 1 tablet (25 mg total) by mouth 2 (two) times daily. 02/23/18   Antoine Poche, MD  potassium chloride SA (K-DUR) 20 MEQ tablet TAKE 2 TABLETS BY MOUTH ONCE DAILY. 03/26/19   Antoine Poche, MD  sertraline (ZOLOFT) 100 MG tablet Take 200 mg by mouth daily.    [provider]  torsemide (DEMADEX) 20 MG tablet Take 80 mg by mouth 2 (two) times daily.    [provider]  traZODone (DESYREL) 100 MG tablet Take 200 mg by mouth at bedtime.     [provider]  warfarin (COUMADIN) 2 MG tablet TAKE 2 TABLETS ONCE A DAY OR AS DIRECTED BY COUMADIN CLINIC. 11/08/22   Antoine Poche, MD    Physical Exam: Vitals:   01/19/23 1430 01/19/23 1615 01/19/23 1756 01/19/23 1812  BP: 106/62 (!) 105/47  (!) 109/55  Pulse:  84  89  Resp: 13 15  19   Temp:  98.3 F (36.8 C) (!) 97.5 F (36.4 C)   TempSrc:   Oral   SpO2:  100%  100%  Weight:   119 kg   Height:   5\' 8"  (1.727 m)     Constitutional: NAD, calm, comfortable Vitals:   01/19/23 1430 01/19/23 1615 01/19/23 1756 01/19/23 1812  BP: 106/62  (!) 105/47  (!) 109/55  Pulse:  84  89  Resp: 13 15  19   Temp:  98.3 F (36.8 C) (!) 97.5 F (36.4 C)   TempSrc:   Oral   SpO2:  100%  100%  Weight:   119 kg   Height:   5\' 8"  (1.727 m)    Eyes: PERRL, lids and conjunctivae normal ENMT: Mucous membranes are moist.  Neck: normal, supple, no masses, no thyromegaly Respiratory: clear to auscultation bilaterally, no wheezing, no crackles. Normal respiratory effort. No accessory muscle use.  Cardiovascular: Regular rate and rhythm, no murmurs / rubs / gallops. No extremity edema.  Extremities warm. Abdomen: no tenderness, no masses palpated. No hepatosplenomegaly.  Bowel sounds positive.  Musculoskeletal: no clubbing / cyanosis. No joint deformity upper and lower extremities . Skin: no rashes, lesions, ulcers. No induration Neurologic: No facial asymmetry, speech fluent, moving extremities spontaneously.  Psychiatric: Normal judgment and insight. Alert and oriented x 3. Normal mood.   Labs on Admission: I have personally reviewed following labs and imaging studies  CBC: Recent Labs  Lab 01/19/23 1142  WBC 10.6*  NEUTROABS 9.1*  HGB 10.2*  HCT 34.6*  MCV 72.7*  PLT 298   Basic Metabolic Panel: Recent Labs  Lab 01/19/23 1142 01/19/23 1616  NA 133*  --   K 2.1* 2.0*  CL 89*  --   CO2 32  --   GLUCOSE 177*  --   BUN 23*  --   CREATININE 1.02*  --   CALCIUM 9.6  --   MG 2.0  --    GFR: Estimated Creatinine Clearance: 91.5 mL/min (A) (by C-G formula based on SCr of 1.02 mg/dL (H)). Liver Function Tests: Recent Labs  Lab 01/19/23 1142  AST 28  ALT 24  ALKPHOS 56  BILITOT 0.4  PROT 7.7  ALBUMIN 4.0    Radiological Exams on Admission: No results found.  EKG: Independently reviewed.  Sinus rhythm, rate 90, QTc 513.   No significant change from prior.  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  Magnesium 2.  Likely 2/2 diuretics, reports temporary increased dose a few days ago, she is now back to normal dose..  She reports compliance with K supplementation. -Please K IV and oral  Chronic anticoagulation Daily PT/INR - Wafarin per pharmacy- INR subtherapeutic- 2.1, recommends starting Lovenox, bridge to warfarin to therapeutic INR 2.5-3.5- considering indication of mechanical valve.  Chronic diastolic (congestive) heart failure (HCC) Stable and compensated.  Last echo 06/2022, EF of 60 to 65%. -Unable to confirm medication dose and freq of diuretics from patient, await med rec -For now resume torsemide 80 mg in a.m.   DVT prophylaxis: Wafarin Code Status: FULL Family Communication: Son at Bedside Disposition Plan: ~ 2 days Consults called: None  Admission status:  Obs stepdown   Author: Onnie Boer, MD 01/19/2023 8:01 PM  For on call review [email or web address].

## 2023-01-19 NOTE — Progress Notes (Addendum)
ANTICOAGULATION CONSULT NOTE - Initial Consult  Pharmacy Consult for Coumadin Indication: atrial fibrillation and mechanical valve  No Known Allergies  Patient Measurements: Height: 5\' 8"  (172.7 cm) Weight: 119 kg (262 lb 5.6 oz) IBW/kg (Calculated) : 63.9   Vital Signs: Temp: 97.5 F (36.4 C) (07/24 1756) Temp Source: Oral (07/24 1756) BP: 109/55 (07/24 1812) Pulse Rate: 89 (07/24 1812)  Labs: Recent Labs    01/19/23 1142  HGB 10.2*  HCT 34.6*  PLT 298  CREATININE 1.02*    Estimated Creatinine Clearance: 91.5 mL/min (A) (by C-G formula based on SCr of 1.02 mg/dL (H)).   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  On warfarin PTA for mechanical valve and atrial fibrillation.  Pharmacy consulted to restart warfarin. INR 2.1 on admission.  Home warfarin is 2 mg M,W,F,Sat and 4 mg T,TH,Sun  Goal of Therapy:  INR - 2.5 - 3.5   Plan:  Give Warfarin 4 mg po x 1 tonight Give Lovenox 120 mg IV x 1 tonight (to bridge until am labs reviewed) Monitor INR and CBC daily  Jeanella Cara, PharmD, [facility] Clinical Pharmacist Please see AMION for all Pharmacists' Contact Phone Numbers 01/19/2023, 8:14 PM

## 2023-01-19 NOTE — ED Triage Notes (Signed)
Pt sent by pcp for potassium of 2.6. CABG 2020. Was routine labs.c/o fatigue with some cramping some. A/o. Nad. Color wnl.

## 2023-01-20 DIAGNOSIS — Z9049 Acquired absence of other specified parts of digestive tract: Secondary | ICD-10-CM | POA: Diagnosis not present

## 2023-01-20 DIAGNOSIS — Z87891 Personal history of nicotine dependence: Secondary | ICD-10-CM | POA: Diagnosis not present

## 2023-01-20 DIAGNOSIS — I5032 Chronic diastolic (congestive) heart failure: Secondary | ICD-10-CM | POA: Diagnosis present

## 2023-01-20 DIAGNOSIS — Z87442 Personal history of urinary calculi: Secondary | ICD-10-CM | POA: Diagnosis not present

## 2023-01-20 DIAGNOSIS — Z7901 Long term (current) use of anticoagulants: Secondary | ICD-10-CM | POA: Diagnosis not present

## 2023-01-20 DIAGNOSIS — I081 Rheumatic disorders of both mitral and tricuspid valves: Secondary | ICD-10-CM | POA: Diagnosis present

## 2023-01-20 DIAGNOSIS — T502X5A Adverse effect of carbonic-anhydrase inhibitors, benzothiadiazides and other diuretics, initial encounter: Secondary | ICD-10-CM | POA: Diagnosis present

## 2023-01-20 DIAGNOSIS — Z7982 Long term (current) use of aspirin: Secondary | ICD-10-CM | POA: Diagnosis not present

## 2023-01-20 DIAGNOSIS — Z6839 Body mass index (BMI) 39.0-39.9, adult: Secondary | ICD-10-CM | POA: Diagnosis not present

## 2023-01-20 DIAGNOSIS — Z79899 Other long term (current) drug therapy: Secondary | ICD-10-CM | POA: Diagnosis not present

## 2023-01-20 DIAGNOSIS — E876 Hypokalemia: Secondary | ICD-10-CM | POA: Diagnosis not present

## 2023-01-20 DIAGNOSIS — R791 Abnormal coagulation profile: Secondary | ICD-10-CM | POA: Diagnosis present

## 2023-01-20 DIAGNOSIS — Z8249 Family history of ischemic heart disease and other diseases of the circulatory system: Secondary | ICD-10-CM | POA: Diagnosis not present

## 2023-01-20 DIAGNOSIS — E669 Obesity, unspecified: Secondary | ICD-10-CM | POA: Diagnosis present

## 2023-01-20 DIAGNOSIS — R5383 Other fatigue: Secondary | ICD-10-CM | POA: Diagnosis present

## 2023-01-20 DIAGNOSIS — Z952 Presence of prosthetic heart valve: Secondary | ICD-10-CM | POA: Diagnosis not present

## 2023-01-20 LAB — BASIC METABOLIC PANEL
Anion gap: 10 (ref 5–15)
BUN: 13 mg/dL (ref 6–20)
Chloride: 99 mmol/L (ref 98–111)
GFR, Estimated: >60 mL/min (ref >60)
Glucose, Bld: 117 mg/dL — ABNORMAL HIGH (ref 70–99)
Potassium: 2.6 mmol/L — CL (ref 3.5–5.1)
Sodium: 135 mmol/L (ref 135–145)

## 2023-01-20 LAB — HIV ANTIBODY (ROUTINE TESTING W REFLEX): HIV Screen 4th Generation wRfx: NONREACTIVE

## 2023-01-20 LAB — CBC
HCT: 31.4 % — ABNORMAL LOW (ref 36.0–46.0)
Hemoglobin: 9.1 g/dL — ABNORMAL LOW (ref 12.0–15.0)
MCH: 21.5 pg — ABNORMAL LOW (ref 26.0–34.0)
MCHC: 29 g/dL — ABNORMAL LOW (ref 30.0–36.0)
MCV: 74.2 fL — ABNORMAL LOW (ref 80.0–100.0)
Platelets: 251 10*3/uL (ref 150–400)
RBC: 4.23 MIL/uL (ref 3.87–5.11)
RDW: 17 % — ABNORMAL HIGH (ref 11.5–15.5)
WBC: 5.5 10*3/uL (ref 4.0–10.5)
nRBC: 0 % (ref 0.0–0.2)

## 2023-01-20 LAB — PROTIME-INR
INR: 1.9 — ABNORMAL HIGH (ref 0.8–1.2)
Prothrombin Time: 21.7 seconds — ABNORMAL HIGH (ref 11.4–15.2)

## 2023-01-20 LAB — MRSA NEXT GEN BY PCR, NASAL: MRSA by PCR Next Gen: NOT DETECTED

## 2023-01-20 MED ORDER — MORPHINE SULFATE (PF) 2 MG/ML IV SOLN
2.0000 mg | Freq: Once | INTRAVENOUS | Status: AC
  Administered 2023-01-20: 2 mg via INTRAVENOUS
  Filled 2023-01-20: qty 1

## 2023-01-20 MED ORDER — POTASSIUM CHLORIDE 10 MEQ/100ML IV SOLN
10.0000 meq | INTRAVENOUS | Status: DC
  Administered 2023-01-20 (×3): 10 meq via INTRAVENOUS
  Filled 2023-01-20 (×6): qty 100

## 2023-01-20 MED ORDER — POTASSIUM CHLORIDE CRYS ER 20 MEQ PO TBCR
40.0000 meq | EXTENDED_RELEASE_TABLET | Freq: Once | ORAL | Status: AC
  Administered 2023-01-20: 40 meq via ORAL
  Filled 2023-01-20: qty 2

## 2023-01-20 MED ORDER — POTASSIUM CHLORIDE 10 MEQ/100ML IV SOLN
10.0000 meq | INTRAVENOUS | Status: AC
  Administered 2023-01-20 (×3): 10 meq via INTRAVENOUS
  Filled 2023-01-20: qty 100

## 2023-01-20 MED ORDER — ENOXAPARIN SODIUM 120 MG/0.8ML IJ SOSY
120.0000 mg | PREFILLED_SYRINGE | Freq: Two times a day (BID) | INTRAMUSCULAR | Status: DC
  Administered 2023-01-20: 120 mg via SUBCUTANEOUS
  Filled 2023-01-20 (×2): qty 0.8

## 2023-01-20 MED ORDER — WARFARIN SODIUM 5 MG PO TABS
6.0000 mg | ORAL_TABLET | Freq: Once | ORAL | Status: AC
  Administered 2023-01-20: 6 mg via ORAL
  Filled 2023-01-20: qty 1

## 2023-01-20 NOTE — Progress Notes (Signed)
PROGRESS NOTE    Gloria James  YTK:[id number] DOB: [birth year] DOA: 01/19/2023 PCP: Estanislado Pandy, MD   Brief Narrative:    Gloria James is a 47 y.o. female with medical history significant for diastolic CHF, mechanical heart valve on chronic anticoagulation with warfarin,. Patient was referred to the ED from outpatient providers office with reports of low potassium.  Patient reports some cramping, and fatigue.  She is admitted with hypokalemia and is currently receiving IV and oral replacement.  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  Magnesium 2.  Likely 2/2 diuretics, reports temporary increased dose a few days ago, she is now back to normal dose..  She reports compliance with K supplementation. -Continue to replete IV and oral   Chronic anticoagulation with subtherapeutic INR Daily PT/INR - Wafarin per pharmacy- INR subtherapeutic- 1.9, continue full dose Lovenox, bridge to warfarin to therapeutic INR 2.5-3.5- considering indication of mechanical valve.   Chronic diastolic (congestive) heart failure (HCC) Stable and compensated.  Last echo 06/2022, EF of 60 to 65%. -Unable to confirm medication dose and freq of diuretics from patient, await med rec -Hold torsemide until hypokalemia corrected  Obesity -BMI 39.86   DVT prophylaxis:Warfarin with bridging Lovenox Code Status: Full Family Communication: None at bedside Disposition Plan:  Status is: Observation The patient will require care spanning > 2 midnights and should be moved to inpatient because: Need for IV medications.   Consultants:  None  Procedures:  None  Antimicrobials:   None   Subjective: Patient seen and evaluated today with no new acute complaints or concerns. No acute concerns or events noted overnight.  Objective: Vitals:   01/20/23 0437 01/20/23 0500 01/20/23 0737 01/20/23 0812  BP:  118/79 (!) 98/50   Pulse:  81 85   Resp:  (!) 33 16   Temp: 98.3 F (36.8 C)   (!) 97.3 F (36.3 C)  TempSrc: Oral   Oral  SpO2:  97% 100%   Weight: 118.9 kg     Height:        Intake/Output Summary (Last 24 hours) at 01/20/2023 1055 Last data filed at 01/20/2023 0926 Gross per 24 hour  Intake 360 ml  Output --  Net 360 ml   Filed Weights   01/19/23 1756 01/20/23 0437  Weight: 119 kg 118.9 kg    Examination:  General exam: Appears calm and comfortable  Respiratory system: Clear to auscultation. Respiratory effort normal. Cardiovascular system: S1 & S2 heard, RRR.  Gastrointestinal system: Abdomen is soft Central nervous system: Alert and awake Extremities: No edema Skin: No significant lesions noted Psychiatry: Flat affect.    Data Reviewed: I have personally reviewed following labs and imaging studies  CBC: Recent Labs  Lab 01/19/23 1142 01/20/23 0506  WBC 10.6* 5.5  NEUTROABS 9.1*  --   HGB 10.2* 9.1*  HCT 34.6* 31.4*  MCV 72.7* 74.2*  PLT 298 251   Basic Metabolic Panel: Recent Labs  Lab 01/19/23 1142 01/19/23 1616 01/20/23 0506  NA 133*  --  135  K 2.1* 2.0* 2.6*  CL 89*  --  99  CO2 32  --  26  GLUCOSE 177*  --  117*  BUN 23*  --  13  CREATININE 1.02*  --  0.81  CALCIUM 9.6  --  9.2  MG 2.0  --   --    GFR: Estimated Creatinine Clearance: 115.2 mL/min (by C-G formula based on SCr of 0.81 mg/dL). Liver Function Tests: Recent Labs  Lab 01/19/23 1142  AST 28  ALT 24  ALKPHOS 56  BILITOT 0.4  PROT 7.7  ALBUMIN 4.0   No results for input(s): "LIPASE", "AMYLASE" in the last 168 hours. No results for input(s): "AMMONIA" in the last 168 hours. Coagulation Profile: Recent Labs  Lab 01/19/23 1938  01/20/23 0506  INR 2.1* 1.9*   Cardiac Enzymes: No results for input(s): "CKTOTAL", "CKMB", "CKMBINDEX", "TROPONINI" in the last 168 hours. BNP (last 3 results) No results for input(s): "PROBNP" in the last 8760 hours. HbA1C: No results for input(s): "HGBA1C" in the last 72 hours. CBG: No results for input(s): "GLUCAP" in the last 168 hours. Lipid Profile: No results for input(s): "CHOL", "HDL", "LDLCALC", "TRIG", "CHOLHDL", "LDLDIRECT" in the last 72 hours. Thyroid Function Tests: No results for input(s): "TSH", "T4TOTAL", "FREET4", "T3FREE", "THYROIDAB" in the last 72 hours. Anemia Panel: No results for input(s): "VITAMINB12", "FOLATE", "FERRITIN", "TIBC", "IRON", "RETICCTPCT" in the last 72 hours. Sepsis Labs: No results for input(s): "PROCALCITON", "LATICACIDVEN" in the last 168 hours.  Recent Results (from the past 240 hour(s))  MRSA Next Gen by PCR, Nasal     Status: None   Collection Time: 01/19/23  5:51 PM   Specimen: Nasal Mucosa; Nasal Swab  Result Value Ref Range Status   MRSA by PCR Next Gen NOT DETECTED NOT DETECTED Final    Comment: (NOTE) The GeneXpert MRSA Assay (FDA approved for NASAL specimens only), is one component of a comprehensive MRSA colonization surveillance program. It is not intended to diagnose MRSA infection nor to guide or monitor treatment for MRSA infections. Test performance is not FDA approved in patients less than 30 years old. Performed at [facility], [address] [place], [place] [address]          Radiology Studies: No results found.      Scheduled Meds:  Chlorhexidine Gluconate Cloth  6 each Topical Q0600   enoxaparin (LOVENOX) injection  120 mg Subcutaneous Q12H   metoprolol tartrate  25 mg Oral BID    morphine injection  2 mg Intravenous Once   warfarin  6 mg Oral ONCE-1600   Warfarin - Pharmacist Dosing Inpatient   Does not apply q1600     LOS: 0 days    Time spent: 35 minutes    Coren Crownover Hoover Brunette, DO [facility]  If 7PM-7AM, please contact night-coverage [email or web address] 01/20/2023, 10:55 AM

## 2023-01-20 NOTE — TOC CM/SW Note (Signed)
Transition of Care [facility]) - Inpatient Brief Assessment   Patient Details  Name: Gloria James MRN: [id number] Date of Birth: 01-15-1976  Transition [facility]) CM/SW Contact:    Villa Herb, LCSWA Phone Number: 01/20/2023, 10:28 AM   Clinical Narrative: Transition of Care Department [facility]) has reviewed patient and no TOC needs have been identified at this time. We will continue to monitor patient advancement through interdisciplinary progression rounds. If new patient transition needs arise, please place a TOC consult.  Transition of Care Asessment: Insurance and Status: Insurance coverage has been reviewed Patient has primary care physician: Yes Home environment has been reviewed: from home Prior level of function:: independent Prior/Current Home Services: No current home services Social Determinants of Health Reivew: SDOH reviewed no interventions necessary Readmission risk has been reviewed: Yes Transition of care needs: no transition of care needs at this time

## 2023-01-20 NOTE — Progress Notes (Signed)
ANTICOAGULATION CONSULT NOTE - Initial Consult  Pharmacy Consult for Lovenox (while INR is sub-therapeutic) Indication: atrial fibrillation and mechanical valve  No Known Allergies  Patient Measurements: Height: 5\' 8"  (172.7 cm) Weight: 118.9 kg (262 lb 2 oz) IBW/kg (Calculated) : 63.9   Vital Signs: Temp: 98.3 F (36.8 C) (07/25 0437) Temp Source: Oral (07/25 0437) BP: 118/79 (07/25 0500) Pulse Rate: 81 (07/25 0500)  Labs: Recent Labs    01/19/23 1142 01/19/23 1938 01/20/23 0506  HGB 10.2*  --  9.1*  HCT 34.6*  --  31.4*  PLT 298  --  251  LABPROT  --  23.6* 21.7*  INR  --  2.1* 1.9*  CREATININE 1.02*  --   --     Estimated Creatinine Clearance: 91.5 mL/min (A) (by C-G formula based on SCr of 1.02 mg/dL (H)).   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  On warfarin PTA for mechanical valve and atrial fibrillation.  Pharmacy consulted to restart warfarin. INR 2.1 on admission.  Home warfarin is 2 mg M,W,F,Sat and 4 mg T,TH,Sun  7/25 AM update:  INR is sub-therapeutic at 1.9 Starting Lovenox bridge while low  Goal of Therapy:  INR - 2.5 - 3.5   Plan:  Lovenox 120 mg subcutaneous q12h Daily CBC/heparin level Monitor for  bleeding  Abran Duke, PharmD, BCPS Clinical Pharmacist Phone: [phone]

## 2023-01-20 NOTE — Progress Notes (Addendum)
ANTICOAGULATION CONSULT NOTE - Initial Consult  Pharmacy Consult for Lovenox (while INR is sub-therapeutic) Indication: atrial fibrillation and mechanical valve  No Known Allergies  Patient Measurements: Height: 5\' 8"  (172.7 cm) Weight: 118.9 kg (262 lb 2 oz) IBW/kg (Calculated) : 63.9   Vital Signs: Temp: 97.3 F (36.3 C) (07/25 0812) Temp Source: Oral (07/25 0812) BP: 98/50 (07/25 0737) Pulse Rate: 85 (07/25 0737)  Labs: Recent Labs    01/19/23 1142 01/19/23 1938 01/20/23 0506  HGB 10.2*  --  9.1*  HCT 34.6*  --  31.4*  PLT 298  --  251  LABPROT  --  23.6* 21.7*  INR  --  2.1* 1.9*  CREATININE 1.02*  --  0.81    Estimated Creatinine Clearance: 115.2 mL/min (by C-G formula based on SCr of 0.81 mg/dL).   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 On warfarin PTA for mechanical valve and atrial fibrillation.  Pharmacy consulted to restart warfarin. INR 2.1 on admission, down to 1.9 today. Goal INR is 2.5-3.5 in setting of MV replacement.  Goal of Therapy:  INR 2.5-3.5 Monitor platelets by anticoagulation  protocol: Yes   Plan:  Administer 6 mg today (1.5x home regimen) x 1 today Daily INR and CBC while on warfarin and enoxaparin  Will M. Dareen Piano, PharmD Clinical Pharmacist 01/20/2023 10:31 AM

## 2023-01-21 DIAGNOSIS — E876 Hypokalemia: Secondary | ICD-10-CM | POA: Diagnosis not present

## 2023-01-21 MED ORDER — POTASSIUM CHLORIDE CRYS ER 20 MEQ PO TBCR: 40.0000 meq | EXTENDED_RELEASE_TABLET | Freq: Two times a day (BID) | ORAL | 0 refills | Status: DC

## 2023-01-21 MED ORDER — POTASSIUM CHLORIDE CRYS ER 20 MEQ PO TBCR
40.0000 meq | EXTENDED_RELEASE_TABLET | Freq: Once | ORAL | Status: AC
  Administered 2023-01-21: 40 meq via ORAL
  Filled 2023-01-21: qty 2

## 2023-01-21 MED ORDER — WARFARIN SODIUM 2 MG PO TABS: 4.0000 mg | ORAL_TABLET | Freq: Once | ORAL | Status: DC

## 2023-01-21 MED ORDER — ENOXAPARIN SODIUM 120 MG/0.8ML IJ SOSY: 120.0000 mg | PREFILLED_SYRINGE | Freq: Two times a day (BID) | INTRAMUSCULAR | 0 refills | Status: DC

## 2023-01-21 NOTE — Plan of Care (Signed)

## 2023-01-21 NOTE — Discharge Summary (Signed)
Physician Discharge Summary  Gloria James EXB:[id number] DOB: 03-24-1976 DOA: 01/19/2023  PCP: Estanislado Pandy, MD  Admit date: 01/19/2023  Discharge date: 01/21/2023  Admitted From:Home  Disposition:  Home  Recommendations for Outpatient Follow-up:  Follow up with PCP in 1-2 weeks Continue on potassium supplementation as well as torsemide as previously prescribed and follow-up BMP in 1 week Continue on Lovenox 120 mg twice daily for bridging while subtherapeutic INR 1.9 noted.  Resume home Coumadin at prior dosing and follow-up INR levels Continue other home medications as prior  Home Health: None  Equipment/Devices: None  Discharge Condition:Stable  CODE STATUS: Full  Diet recommendation: Heart Healthy  Brief/Interim Summary: Gloria James is a 47 y.o. female with medical history significant for diastolic CHF, mechanical heart valve on chronic anticoagulation with warfarin,. Patient was referred to the ED from outpatient providers office with reports of low potassium.  Patient reports some cramping, and fatigue.  She was admitted for severe hypokalemia in the setting of aggressive diuresis and had run out of her home potassium supplementation.  She has now received adequate amounts of oral and IV supplementation and is in stable condition for discharge and will continue her home diuretics as well as potassium supplementation as previously prescribed.  She was also noted to have subtherapeutic INR levels and was started on full dose Lovenox shots and will need to remain on this at home until her levels can become elevated to therapeutic levels once again.  No other acute events or concerns noted throughout this admission.  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  Discharge Instructions  Discharge Instructions     Diet - low sodium heart healthy   Complete by: As directed    Increase activity slowly   Complete by: As directed       Allergies as of 01/21/2023   No Known Allergies      Medication List     TAKE these medications    albuterol 108 (90 Base) MCG/ACT inhaler Commonly known as: VENTOLIN HFA Inhale 2 puffs into the lungs every 4 (four) hours as needed for wheezing or shortness of breath.   ALPRAZolam 0.5 MG tablet Commonly known as: XANAX Take 0.5 mg by mouth 4 (four) times daily as needed for anxiety.   aspirin EC 81 MG tablet Take 1 tablet (81 mg total) by mouth daily.   diclofenac Sodium 1 % Gel Commonly known as: VOLTAREN Apply topically.   enoxaparin 120 MG/0.8ML injection Commonly known as: LOVENOX Inject 0.8 mLs (120 mg total) into the skin 2 (two) times daily for 5 days.   metolazone 2.5 MG tablet Commonly known as: ZAROXOLYN Take 1 tablet (2.5 mg total) by mouth once a week. As needed (TAKE 30 MINUTES PRIOR TO TAKING TORSEMIDE)   metoprolol tartrate 25 MG tablet Commonly known as: LOPRESSOR Take 1 tablet (25 mg total) by mouth 2 (two) times daily.   potassium chloride SA 20 MEQ tablet Commonly known as: KLOR-CON M Take 2 tablets (40 mEq total) by mouth 2 (two) times daily.   sertraline 100 MG tablet Commonly known as: ZOLOFT Take 200 mg by mouth daily.   torsemide  20 MG tablet Commonly known as: DEMADEX Take 80 mg by mouth 2 (two) times daily.   traZODone 100 MG tablet Commonly known as: DESYREL Take 200 mg by mouth at bedtime.   warfarin 2 MG tablet Commonly known as: COUMADIN Take as directed. If you are unsure how to take this medication, talk to your nurse or doctor. Original instructions: TAKE 2 TABLETS  ONCE A DAY OR AS DIRECTED BY COUMADIN CLINIC. What changed: See the new instructions.        Follow-up Information     Sasser, Clarene Critchley, MD. Schedule an appointment as soon as possible for a visit in 1 week(s).   Specialty: Family Medicine Contact information: [address] [place] [address] [phone]                No Known Allergies  Consultations: None   Procedures/Studies: No results found.   Discharge Exam: Vitals:   01/21/23 0439 01/21/23 0450  BP: (!) 82/60 110/64  Pulse: 80   Resp: 20   Temp: 97.8 F (36.6 C)   SpO2: 96%    Vitals:   01/20/23 1800 01/20/23 2108 01/21/23 0439 01/21/23 0450  BP: (!) 107/59 116/87 (!) 82/60 110/64  Pulse: 89 92 80   Resp:  16 20   Temp:  98.3 F (36.8 C) 97.8 F (36.6 C)   TempSrc:  Oral Oral   SpO2: 100% 100% 96%   Weight:      Height:        General: Pt is alert, awake, not in acute distress Cardiovascular: RRR, S1/S2 +, no rubs, no gallops Respiratory: CTA bilaterally, no wheezing, no rhonchi Abdominal: Soft, NT, ND, bowel sounds + Extremities: no edema, no cyanosis    The results of significant diagnostics from this hospitalization (including imaging, microbiology, ancillary and laboratory) are listed below for reference.     Microbiology: Recent Results (from the past 240 hour(s))  MRSA Next Gen by PCR, Nasal     Status: None   Collection Time: 01/19/23  5:51 PM   Specimen: Nasal Mucosa; Nasal Swab  Result Value Ref Range Status   MRSA by PCR Next Gen NOT DETECTED NOT DETECTED Final    Comment: (NOTE) The GeneXpert MRSA Assay (FDA approved for NASAL specimens only), is one component of a comprehensive MRSA colonization surveillance program. It is not intended to diagnose MRSA infection nor to guide or monitor treatment for MRSA infections. Test performance is not FDA approved in patients less than 37 years old. Performed at [facility], [address], [place], [place] [address]       Labs: BNP (last 3 results) No results for input(s): "BNP" in the last 8760 hours. Basic Metabolic Panel: Recent Labs  Lab 01/19/23 1142 01/19/23 1616 01/20/23 0506 01/21/23 0349  NA 133*  --  135 136  K 2.1* 2.0* 2.6* 3.2*  CL 89*  --  99 103  CO2 32  --  26 23  GLUCOSE 177*  --  117* 103*  BUN 23*  --  13 10  CREATININE 1.02*  --  0.81 0.83  CALCIUM 9.6  --  9.2 8.9  MG 2.0  --   --  2.1   Liver Function Tests: Recent Labs  Lab 01/19/23 1142  AST 28  ALT 24  ALKPHOS 56  BILITOT 0.4  PROT 7.7  ALBUMIN 4.0   No results for input(s): "LIPASE", "AMYLASE" in the last 168 hours. No results for input(s): "AMMONIA" in the  last 168 hours. CBC: Recent Labs  Lab 01/19/23 1142 01/20/23 0506 01/21/23 0349  WBC 10.6* 5.5 4.2  NEUTROABS 9.1*  --   --   HGB 10.2* 9.1* 8.2*  HCT 34.6* 31.4* 28.9*  MCV 72.7* 74.2* 76.3*  PLT 298 251 200   Cardiac Enzymes: No results for input(s): "CKTOTAL", "CKMB", "CKMBINDEX", "TROPONINI" in the last 168 hours. BNP: Invalid input(s): "POCBNP" CBG: No results for input(s): "GLUCAP" in the last 168 hours. D-Dimer No results for input(s): "DDIMER" in the last 72 hours. Hgb A1c No results for input(s): "HGBA1C" in the last 72 hours. Lipid Profile No results for input(s): "CHOL", "HDL", "LDLCALC", "TRIG", "CHOLHDL", "LDLDIRECT" in the last 72 hours. Thyroid function studies No results for input(s): "TSH", "T4TOTAL", "T3FREE", "THYROIDAB" in the last 72 hours.  Invalid input(s): "FREET3" Anemia work up No results for input(s): "VITAMINB12", "FOLATE", "FERRITIN", "TIBC", "IRON", "RETICCTPCT" in the last 72 hours. Urinalysis    Component Value Date/Time   COLORURINE YELLOW 12/19/2017 1037   APPEARANCEUR CLEAR 12/19/2017 1037   LABSPEC >1.046 (H) 12/19/2017 1037   PHURINE 6.0 12/19/2017 1037   GLUCOSEU NEGATIVE 12/19/2017 1037   HGBUR NEGATIVE 12/19/2017 1037   BILIRUBINUR NEGATIVE 12/19/2017 1037   KETONESUR NEGATIVE 12/19/2017  1037   PROTEINUR NEGATIVE 12/19/2017 1037   NITRITE NEGATIVE 12/19/2017 1037   LEUKOCYTESUR NEGATIVE 12/19/2017 1037   Sepsis Labs Recent Labs  Lab 01/19/23 1142 01/20/23 0506 01/21/23 0349  WBC 10.6* 5.5 4.2   Microbiology Recent Results (from the past 240 hour(s))  MRSA Next Gen by PCR, Nasal     Status: None   Collection Time: 01/19/23  5:51 PM   Specimen: Nasal Mucosa; Nasal Swab  Result Value Ref Range Status   MRSA by PCR Next Gen NOT DETECTED NOT DETECTED Final    Comment: (NOTE) The GeneXpert MRSA Assay (FDA approved for NASAL specimens only), is one component of a comprehensive MRSA colonization surveillance program. It is not intended to diagnose MRSA infection nor to guide or monitor treatment for MRSA infections. Test performance is not FDA approved in patients less than 43 years old. Performed at [facility], [address], [place], [place] [address]      Time coordinating discharge: 35 minutes  SIGNED:   Erick Blinks, DO [facility] 01/21/2023, 9:24 AM  If 7PM-7AM, please contact night-coverage [email or web address]

## 2023-01-21 NOTE — Progress Notes (Signed)
Patient ambulating several times in hallways. Patient has a history of restless legs and states the walking makes her legs feel better.

## 2023-01-21 NOTE — Progress Notes (Addendum)
ANTICOAGULATION CONSULT NOTE -   Pharmacy Consult for warfarin and Lovenox (while INR is sub-therapeutic) Indication: atrial fibrillation and mechanical valve  No Known Allergies  Patient Measurements: Height: 5\' 8"  (172.7 cm) Weight: 121.2 kg (267 lb 3.2 oz) IBW/kg (Calculated) : 63.9   Vital Signs: Temp: 97.8 F (36.6 C) (07/26 0439) Temp Source: Oral (07/26 0439) BP: 110/64 (07/26 0450) Pulse Rate: 80 (07/26 0439)  Labs: Recent Labs    01/19/23 1142 01/19/23 1938 01/20/23 0506 01/21/23 0349  HGB 10.2*  --  9.1* 8.2*  HCT 34.6*  --  31.4* 28.9*  PLT 298  --  251 200  LABPROT  --  23.6* 21.7* 21.6*  INR  --  2.1* 1.9* 1.9*  CREATININE 1.02*  --  0.81 0.83    Estimated Creatinine Clearance: 113.6 mL/min (by C-G formula based on SCr of 0.83 mg/dL).   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  Pharmacy consulted to restart warfarin. INR 2.1 on admission, down to 1.9 today. Goal INR is 2.5-3.5 in setting of MV replacement.  Goal of Therapy:  INR 2.5-3.5 Monitor platelets by anticoagulation protocol:  Yes   Plan:  Warfarin  4 mg today  x 1 today Continue lovenox 120 mg subcutaneous q12h  Daily INR and CBC while on warfarin and enoxaparin  Judeth Cornfield, PharmD Clinical Pharmacist 01/21/2023 7:51 AM

## 2023-02-02 ENCOUNTER — Ambulatory Visit: Payer: Medicaid Other | Attending: Cardiology | Admitting: *Deleted

## 2023-02-02 DIAGNOSIS — Z5181 Encounter for therapeutic drug level monitoring: Secondary | ICD-10-CM | POA: Diagnosis not present

## 2023-02-02 DIAGNOSIS — Z954 Presence of other heart-valve replacement: Secondary | ICD-10-CM

## 2023-02-02 LAB — POCT INR: INR: 2.2 (ref 2.0–3.0)

## 2023-02-02 NOTE — Patient Instructions (Signed)
Take warfarin 2 tablets tonight then increase dose to 2 tablets daily except 1 tablet on Mondays, Wednesdays and Fridays Continue greens Recheck in 2 weeks

## 2023-02-16 ENCOUNTER — Ambulatory Visit: Payer: Medicaid Other | Attending: Cardiology | Admitting: *Deleted

## 2023-02-16 DIAGNOSIS — Z954 Presence of other heart-valve replacement: Secondary | ICD-10-CM | POA: Diagnosis not present

## 2023-02-16 DIAGNOSIS — Z5181 Encounter for therapeutic drug level monitoring: Secondary | ICD-10-CM

## 2023-02-16 LAB — POCT INR: INR: 4.7 — AB (ref 2.0–3.0)

## 2023-02-16 NOTE — Patient Instructions (Signed)
Hold warfarin today then decrease dose to 1 tablet daily except 2 tablets on Sundays, Tuesdays and Thursdays Continue greens Recheck in 2 weeks

## 2023-02-20 ENCOUNTER — Other Ambulatory Visit: Payer: Self-pay

## 2023-02-20 ENCOUNTER — Encounter (HOSPITAL_COMMUNITY): Payer: Self-pay

## 2023-02-20 ENCOUNTER — Observation Stay (HOSPITAL_COMMUNITY)
Admission: EM | Admit: 2023-02-20 | Discharge: 2023-02-21 | Disposition: A | Discharge status: Home / Self Care | Payer: Medicaid Other | Attending: Family Medicine | Admitting: Family Medicine

## 2023-02-20 ENCOUNTER — Emergency Department (HOSPITAL_COMMUNITY): Payer: Medicaid Other

## 2023-02-20 DIAGNOSIS — R197 Diarrhea, unspecified: Secondary | ICD-10-CM | POA: Insufficient documentation

## 2023-02-20 DIAGNOSIS — Z79899 Other long term (current) drug therapy: Secondary | ICD-10-CM | POA: Diagnosis not present

## 2023-02-20 DIAGNOSIS — Z87891 Personal history of nicotine dependence: Secondary | ICD-10-CM | POA: Diagnosis not present

## 2023-02-20 DIAGNOSIS — R112 Nausea with vomiting, unspecified: Principal | ICD-10-CM | POA: Insufficient documentation

## 2023-02-20 DIAGNOSIS — E876 Hypokalemia: Secondary | ICD-10-CM | POA: Insufficient documentation

## 2023-02-20 DIAGNOSIS — Z7901 Long term (current) use of anticoagulants: Secondary | ICD-10-CM

## 2023-02-20 DIAGNOSIS — Z1152 Encounter for screening for COVID-19: Secondary | ICD-10-CM | POA: Insufficient documentation

## 2023-02-20 DIAGNOSIS — R1115 Cyclical vomiting syndrome unrelated to migraine: Secondary | ICD-10-CM | POA: Diagnosis not present

## 2023-02-20 DIAGNOSIS — D649 Anemia, unspecified: Secondary | ICD-10-CM | POA: Diagnosis not present

## 2023-02-20 DIAGNOSIS — Z7982 Long term (current) use of aspirin: Secondary | ICD-10-CM | POA: Diagnosis not present

## 2023-02-20 DIAGNOSIS — I5032 Chronic diastolic (congestive) heart failure: Secondary | ICD-10-CM | POA: Diagnosis present

## 2023-02-20 DIAGNOSIS — Z954 Presence of other heart-valve replacement: Secondary | ICD-10-CM

## 2023-02-20 DIAGNOSIS — Z5181 Encounter for therapeutic drug level monitoring: Secondary | ICD-10-CM

## 2023-02-20 LAB — CBC
HCT: 26.5 % — ABNORMAL LOW (ref 36.0–46.0)
Hemoglobin: 7.3 g/dL — ABNORMAL LOW (ref 12.0–15.0)
MCH: 20 pg — ABNORMAL LOW (ref 26.0–34.0)
MCHC: 27.5 g/dL — ABNORMAL LOW (ref 30.0–36.0)
MCV: 72.6 fL — ABNORMAL LOW (ref 80.0–100.0)
Platelets: 202 10*3/uL (ref 150–400)
RBC: 3.65 MIL/uL — ABNORMAL LOW (ref 3.87–5.11)
RDW: 17.2 % — ABNORMAL HIGH (ref 11.5–15.5)
WBC: 4.4 10*3/uL (ref 4.0–10.5)
nRBC: 0.5 % — ABNORMAL HIGH (ref 0.0–0.2)

## 2023-02-20 LAB — COMPREHENSIVE METABOLIC PANEL
ALT: 19 U/L (ref 0–44)
AST: 23 U/L (ref 15–41)
Albumin: 3.6 g/dL (ref 3.5–5.0)
Alkaline Phosphatase: 47 U/L (ref 38–126)
Anion gap: 11 (ref 5–15)
BUN: 10 mg/dL (ref 6–20)
CO2: 25 mmol/L (ref 22–32)
Calcium: 9.1 mg/dL (ref 8.9–10.3)
Chloride: 100 mmol/L (ref 98–111)
Creatinine, Ser: 0.99 mg/dL (ref 0.44–1.00)
GFR, Estimated: >60 mL/min (ref >60)
Glucose, Bld: 143 mg/dL — ABNORMAL HIGH (ref 70–99)
Potassium: 2.6 mmol/L — CL (ref 3.5–5.1)
Sodium: 136 mmol/L (ref 135–145)
Total Bilirubin: 0.9 mg/dL (ref 0.3–1.2)
Total Protein: 6.8 g/dL (ref 6.5–8.1)

## 2023-02-20 LAB — URINALYSIS, ROUTINE W REFLEX MICROSCOPIC
Bilirubin Urine: NEGATIVE
Glucose, UA: NEGATIVE mg/dL
Hgb urine dipstick: NEGATIVE
Ketones, ur: NEGATIVE mg/dL
Nitrite: NEGATIVE
Protein, ur: 30 mg/dL — AB
Specific Gravity, Urine: 1.013 (ref 1.005–1.030)
pH: 7 (ref 5.0–8.0)

## 2023-02-20 LAB — PROTIME-INR
INR: 1.4 — ABNORMAL HIGH (ref 0.8–1.2)
Prothrombin Time: 17.4 seconds — ABNORMAL HIGH (ref 11.4–15.2)

## 2023-02-20 LAB — BASIC METABOLIC PANEL
Anion gap: 7 (ref 5–15)
BUN: 8 mg/dL (ref 6–20)
CO2: 28 mmol/L (ref 22–32)
Calcium: 9 mg/dL (ref 8.9–10.3)
Chloride: 102 mmol/L (ref 98–111)
Creatinine, Ser: 0.92 mg/dL (ref 0.44–1.00)
GFR, Estimated: >60 mL/min (ref >60)
Glucose, Bld: 111 mg/dL — ABNORMAL HIGH (ref 70–99)
Potassium: 2.9 mmol/L — ABNORMAL LOW (ref 3.5–5.1)
Sodium: 137 mmol/L (ref 135–145)

## 2023-02-20 LAB — MAGNESIUM: Magnesium: 1.9 mg/dL (ref 1.7–2.4)

## 2023-02-20 LAB — POC URINE PREG, ED: Preg Test, Ur: NEGATIVE

## 2023-02-20 LAB — POC OCCULT BLOOD, ED: Fecal Occult Bld: NEGATIVE

## 2023-02-20 LAB — SARS CORONAVIRUS 2 BY RT PCR: SARS Coronavirus 2 by RT PCR: NEGATIVE

## 2023-02-20 LAB — LIPASE, BLOOD: Lipase: 27 U/L (ref 11–51)

## 2023-02-20 LAB — PREPARE RBC (CROSSMATCH)

## 2023-02-20 MED ORDER — SODIUM CHLORIDE 0.9% IV SOLUTION: Freq: Once | INTRAVENOUS | Status: AC

## 2023-02-20 MED ORDER — SODIUM CHLORIDE 0.9% FLUSH: 3.0000 mL | INTRAVENOUS | Status: DC | PRN

## 2023-02-20 MED ORDER — ACETAMINOPHEN 325 MG PO TABS: 650.0000 mg | ORAL_TABLET | Freq: Four times a day (QID) | ORAL | Status: DC | PRN

## 2023-02-20 MED ORDER — SODIUM CHLORIDE 0.9% FLUSH
3.0000 mL | Freq: Two times a day (BID) | INTRAVENOUS | Status: DC
  Administered 2023-02-21: 3 mL via INTRAVENOUS

## 2023-02-20 MED ORDER — ROSUVASTATIN CALCIUM 10 MG PO TABS
5.0000 mg | ORAL_TABLET | Freq: Every day | ORAL | Status: DC
  Administered 2023-02-20 – 2023-02-21 (×2): 5 mg via ORAL
  Filled 2023-02-20 (×2): qty 1

## 2023-02-20 MED ORDER — METOPROLOL TARTRATE 25 MG PO TABS
25.0000 mg | ORAL_TABLET | Freq: Two times a day (BID) | ORAL | Status: DC
  Administered 2023-02-20 – 2023-02-21 (×2): 25 mg via ORAL
  Filled 2023-02-20 (×2): qty 1

## 2023-02-20 MED ORDER — WARFARIN - PHARMACIST DOSING INPATIENT: Freq: Every day | Status: DC

## 2023-02-20 MED ORDER — SODIUM CHLORIDE 0.9 % IV SOLN: INTRAVENOUS | Status: DC | PRN

## 2023-02-20 MED ORDER — DM-GUAIFENESIN ER 30-600 MG PO TB12
1.0000 | ORAL_TABLET | Freq: Two times a day (BID) | ORAL | Status: DC
  Administered 2023-02-20 – 2023-02-21 (×2): 1 via ORAL
  Filled 2023-02-20 (×2): qty 1

## 2023-02-20 MED ORDER — ONDANSETRON HCL 4 MG PO TABS: 4.0000 mg | ORAL_TABLET | Freq: Four times a day (QID) | ORAL | Status: DC | PRN

## 2023-02-20 MED ORDER — ALPRAZOLAM 0.5 MG PO TABS
0.5000 mg | ORAL_TABLET | Freq: Four times a day (QID) | ORAL | Status: DC | PRN
  Administered 2023-02-20: 0.5 mg via ORAL
  Filled 2023-02-20: qty 1

## 2023-02-20 MED ORDER — SODIUM CHLORIDE 0.9 % IV BOLUS
500.0000 mL | Freq: Once | INTRAVENOUS | Status: AC
  Administered 2023-02-20: 500 mL via INTRAVENOUS

## 2023-02-20 MED ORDER — POTASSIUM CHLORIDE CRYS ER 20 MEQ PO TBCR
40.0000 meq | EXTENDED_RELEASE_TABLET | Freq: Once | ORAL | Status: AC
  Administered 2023-02-20: 40 meq via ORAL
  Filled 2023-02-20: qty 2

## 2023-02-20 MED ORDER — POTASSIUM CHLORIDE IN NACL 40-0.9 MEQ/L-% IV SOLN
INTRAVENOUS | Status: DC
  Filled 2023-02-20 (×3): qty 1000

## 2023-02-20 MED ORDER — ACETAMINOPHEN 650 MG RE SUPP: 650.0000 mg | Freq: Four times a day (QID) | RECTAL | Status: DC | PRN

## 2023-02-20 MED ORDER — ONDANSETRON HCL 4 MG/2ML IJ SOLN
4.0000 mg | Freq: Once | INTRAMUSCULAR | Status: AC
  Administered 2023-02-20: 4 mg via INTRAVENOUS
  Filled 2023-02-20: qty 2

## 2023-02-20 MED ORDER — SERTRALINE HCL 50 MG PO TABS
200.0000 mg | ORAL_TABLET | Freq: Every day | ORAL | Status: DC
  Administered 2023-02-20 – 2023-02-21 (×2): 200 mg via ORAL
  Filled 2023-02-20 (×2): qty 4

## 2023-02-20 MED ORDER — LOPERAMIDE HCL 2 MG PO CAPS
4.0000 mg | ORAL_CAPSULE | Freq: Two times a day (BID) | ORAL | Status: DC
  Administered 2023-02-20 – 2023-02-21 (×2): 4 mg via ORAL
  Filled 2023-02-20 (×2): qty 2

## 2023-02-20 MED ORDER — WARFARIN SODIUM 5 MG PO TABS
6.0000 mg | ORAL_TABLET | ORAL | Status: AC
  Administered 2023-02-20: 6 mg via ORAL
  Filled 2023-02-20: qty 1

## 2023-02-20 MED ORDER — AMOXICILLIN-POT CLAVULANATE 875-125 MG PO TABS
1.0000 | ORAL_TABLET | Freq: Two times a day (BID) | ORAL | Status: DC
  Administered 2023-02-20 – 2023-02-21 (×2): 1 via ORAL
  Filled 2023-02-20 (×2): qty 1

## 2023-02-20 MED ORDER — MUPIROCIN 2 % EX OINT: 1.0000 | TOPICAL_OINTMENT | Freq: Two times a day (BID) | CUTANEOUS | Status: DC

## 2023-02-20 MED ORDER — POTASSIUM CHLORIDE 10 MEQ/100ML IV SOLN
10.0000 meq | INTRAVENOUS | Status: AC
  Administered 2023-02-20 (×4): 10 meq via INTRAVENOUS
  Filled 2023-02-20 (×3): qty 100

## 2023-02-20 MED ORDER — MORPHINE SULFATE (PF) 4 MG/ML IV SOLN
4.0000 mg | Freq: Once | INTRAVENOUS | Status: AC
  Administered 2023-02-20: 4 mg via INTRAVENOUS
  Filled 2023-02-20: qty 1

## 2023-02-20 MED ORDER — POTASSIUM CHLORIDE CRYS ER 20 MEQ PO TBCR
40.0000 meq | EXTENDED_RELEASE_TABLET | Freq: Two times a day (BID) | ORAL | Status: DC
  Administered 2023-02-20 – 2023-02-21 (×2): 40 meq via ORAL
  Filled 2023-02-20 (×2): qty 2

## 2023-02-20 MED ORDER — ASPIRIN 81 MG PO TBEC
81.0000 mg | DELAYED_RELEASE_TABLET | Freq: Every day | ORAL | Status: DC
  Administered 2023-02-20 – 2023-02-21 (×2): 81 mg via ORAL
  Filled 2023-02-20 (×2): qty 1

## 2023-02-20 MED ORDER — ONDANSETRON HCL 4 MG/2ML IJ SOLN
4.0000 mg | Freq: Four times a day (QID) | INTRAMUSCULAR | Status: DC | PRN
  Administered 2023-02-20 – 2023-02-21 (×3): 4 mg via INTRAVENOUS
  Filled 2023-02-20 (×3): qty 2

## 2023-02-20 MED ORDER — TRAZODONE HCL 50 MG PO TABS
200.0000 mg | ORAL_TABLET | Freq: Every day | ORAL | Status: DC
  Administered 2023-02-20: 200 mg via ORAL
  Filled 2023-02-20: qty 4

## 2023-02-20 MED ORDER — ALBUTEROL SULFATE (2.5 MG/3ML) 0.083% IN NEBU: 2.5000 mg | INHALATION_SOLUTION | RESPIRATORY_TRACT | Status: DC | PRN

## 2023-02-20 NOTE — ED Notes (Signed)
Per hospitalist, wait for updated bloodwork before transferring.

## 2023-02-20 NOTE — ED Notes (Signed)
Changed out BP cuff to smaller and placed on left wrist because both arms were being used for IV's.

## 2023-02-20 NOTE — ED Provider Notes (Signed)
[place] EMERGENCY DEPARTMENT AT [facility] Provider Note   CSN: [id number] Arrival date & time: 02/20/23  5638     History  Chief Complaint  Patient presents with   Nausea   Emesis   Diarrhea    Gloria James is a 47 y.o. female.  The history is provided by the patient and medical records. No language interpreter was used.  Emesis Associated symptoms: diarrhea   Diarrhea Associated symptoms: vomiting       47 year old female significant history of rheumatic mitral valve stenosis status post mitral valve replacement currently on Coumadin, CHF, anemia, panic disorder presenting to the ED with complaints of nausea vomiting diarrhea.  Patient report for the past 2 days she has had persistent nausea, vomiting multiple episodes of yellow emesis, as well as having recurrent nonbloody non mucousy loose stool.  Endorsed occasional cough from vomiting.  Endorsing chills but without any fever or bodyaches.  No runny nose sneezing sore throat no urinary symptoms no chest pains or shortness of breath and no significant abdominal pain.  No recent sick contact or eating any exotic food.  She mention previously she had similar episodes of nausea and vomiting and her potassium level was quite low.  This concerns her.  No recent antibiotic use. Home Medications Prior to Admission medications   Medication Sig Start Date End Date Taking? Authorizing Provider  albuterol (VENTOLIN HFA) 108 (90 Base) MCG/ACT inhaler Inhale 2 puffs into the lungs every 4 (four) hours as needed for wheezing or shortness of breath.    [provider]  ALPRAZolam Prudy Feeler) 0.5 MG tablet Take 0.5 mg by mouth 4 (four) times daily as needed for anxiety.     [provider]  aspirin EC 81 MG tablet Take 1 tablet (81 mg total) by mouth daily. 01/13/18   Antoine Poche, MD  diclofenac Sodium (VOLTAREN) 1 % GEL Apply topically. 08/14/22 08/14/23  [provider]  enoxaparin (LOVENOX) 120  MG/0.8ML injection Inject 0.8 mLs (120 mg total) into the skin 2 (two) times daily for 5 days. 01/21/23 01/26/23  Sherryll Burger, Pratik D, DO  metolazone (ZAROXOLYN) 2.5 MG tablet Take 1 tablet (2.5 mg total) by mouth once a week. As needed (TAKE 30 MINUTES PRIOR TO TAKING TORSEMIDE) 08/26/22   Sharlene Dory, NP  metoprolol tartrate (LOPRESSOR) 25 MG tablet Take 1 tablet (25 mg total) by mouth 2 (two) times daily. 02/23/18   Antoine Poche, MD  potassium chloride SA (KLOR-CON M) 20 MEQ tablet Take 2 tablets (40 mEq total) by mouth 2 (two) times daily. 01/21/23 02/20/23  Sherryll Burger, Pratik D, DO  sertraline (ZOLOFT) 100 MG tablet Take 200 mg by mouth daily.    [provider]  torsemide (DEMADEX) 20 MG tablet Take 80 mg by mouth 2 (two) times daily.    [provider]  traZODone (DESYREL) 100 MG tablet Take 200 mg by mouth at bedtime.     [provider]  warfarin (COUMADIN) 2 MG tablet TAKE 2 TABLETS ONCE A DAY OR AS DIRECTED BY COUMADIN CLINIC. Patient taking differently: Take 2 mg by mouth See admin instructions. TAKE 2 mg on Monday,Wednesday,Friday and Saturday and 4 mg on Sunday,Tuesday and Thursday. AS DIRECTED BY COUMADIN CLINIC. 11/08/22   Antoine Poche, MD      Allergies    Patient has no known allergies.    Review of Systems   Review of Systems  Gastrointestinal:  Positive for diarrhea and vomiting.  All other  systems reviewed and are negative.   Physical Exam Updated Vital Signs BP 129/78 (BP Location: Left Arm)   Pulse (!) 103   Temp 97.7 F (36.5 C) (Oral)   Resp 18   Ht 5\' 8"  (1.727 m)   Wt 113.4 kg   SpO2 98%   BMI 38.01 kg/m  Physical Exam Vitals and nursing note reviewed.  Constitutional:      General: She is not in acute distress.    Appearance: She is well-developed. She is obese.  HENT:     Head: Atraumatic.  Eyes:     Conjunctiva/sclera: Conjunctivae normal.  Cardiovascular:     Rate and Rhythm: Tachycardia present.     Heart sounds:  Murmur heard.  Pulmonary:     Effort: Pulmonary effort is normal.     Breath sounds: No wheezing, rhonchi or rales.  Abdominal:     General: There is no distension.     Palpations: Abdomen is soft.     Tenderness: There is no abdominal tenderness.  Musculoskeletal:     Cervical back: Neck supple.  Skin:    Findings: No rash.  Neurological:     Mental Status: She is alert. Mental status is at baseline.  Psychiatric:        Mood and Affect: Mood normal.     ED Results / Procedures / Treatments   Labs (all labs ordered are listed, but only abnormal results are displayed) Labs Reviewed  COMPREHENSIVE METABOLIC PANEL - Abnormal; Notable for the following components:      Result Value   Potassium 2.6 (*)    Glucose, Bld 143 (*)    All other components within normal limits  PROTIME-INR - Abnormal; Notable for the following components:   Prothrombin Time 17.4 (*)    INR 1.4 (*)    All other components within normal limits  CBC - Abnormal; Notable for the following components:   RBC 3.65 (*)    Hemoglobin 7.3 (*)    HCT 26.5 (*)    MCV 72.6 (*)    MCH 20.0 (*)    MCHC 27.5 (*)    RDW 17.2 (*)    nRBC 0.5 (*)    All other components within normal limits  SARS CORONAVIRUS 2 BY RT PCR  LIPASE, BLOOD  URINALYSIS, ROUTINE W REFLEX MICROSCOPIC  POC URINE PREG, ED  POC OCCULT BLOOD, ED  TYPE AND SCREEN  PREPARE RBC (CROSSMATCH)    EKG None ED ECG REPORT   Date: 02/20/2023  Rate: 105  Rhythm: sinus tachycardia  QRS Axis: normal  Intervals: QT prolonged  ST/T Wave abnormalities: nonspecific T wave changes  Conduction Disutrbances:first-degree A-V block   Narrative Interpretation:   Old EKG Reviewed: unchanged  I have personally reviewed the EKG tracing and agree with the computerized printout as noted.   Radiology DG ABD ACUTE 2+V W 1V CHEST  Result Date: 02/20/2023 CLINICAL DATA:  Nausea, vomiting, and diarrhea. EXAM: DG ABDOMEN ACUTE WITH 1 VIEW CHEST  COMPARISON:  Chest radiograph 12/28/2022. CT abdomen and pelvis 10/05/2022. FINDINGS: The cardiomediastinal silhouette is unchanged as post mitral valve repair. There is mildly increased peribronchial thickening, and there are mildly increased densities in the right mid lung and both lung bases. No overt pulmonary edema, sizable pleural effusion, or pneumothorax is identified. No intraperitoneal free air is identified. A small amount of bowel gas is scattered throughout the abdomen. No dilated loops of bowel are seen to suggest obstruction. An IUD projects over the midline  of the pelvis. No acute osseous abnormality is seen. IMPRESSION: 1. Bronchitic changes with mildly increased densities in the right mid lung and both lung bases which could reflect atelectasis or infection. 2. No evidence of bowel obstruction. Electronically Signed   By: Sebastian Ache M.D.   On: 02/20/2023 12:39    Procedures .Critical Care  Performed by: Fayrene Helper, PA-C Authorized by: Fayrene Helper, PA-C   Critical care provider statement:    Critical care time (minutes):  30   Critical care was time spent personally by me on the following activities:  Development of treatment plan with patient or surrogate, discussions with consultants, evaluation of patient's response to treatment, examination of patient, ordering and review of laboratory studies, ordering and review of radiographic studies, ordering and performing treatments and interventions, pulse oximetry, re-evaluation of patient's condition and review of old charts     Medications Ordered in ED Medications  potassium chloride SA (KLOR-CON M) CR tablet 40 mEq (has no administration in time range)  potassium chloride 10 mEq in 100 mL IVPB (has no administration in time range)  0.9 %  sodium chloride infusion (Manually program via Guardrails IV Fluids) (has no administration in time range)  morphine (PF) 4 MG/ML injection 4 mg (has no administration in time range)  sodium  chloride 0.9 % bolus 500 mL (500 mLs Intravenous New Bag/Given 02/20/23 0937)  ondansetron (ZOFRAN) injection 4 mg (4 mg Intravenous Given 02/20/23 0935)    ED Course/ Medical Decision Making/ A&P                                 Medical Decision Making Amount and/or Complexity of Data Reviewed Labs: ordered. ECG/medicine tests: ordered.  Risk Prescription drug management. Decision regarding hospitalization.   BP 129/78 (BP Location: Left Arm)   Pulse (!) 103   Temp 97.7 F (36.5 C) (Oral)   Resp 18   Ht 5\' 8"  (1.727 m)   Wt 113.4 kg   SpO2 98%   BMI 38.01 kg/m   68:7 AM 47 year old female significant history of rheumatic mitral valve stenosis status post mitral valve replacement currently on Coumadin, CHF, anemia, panic disorder presenting to the ED with complaints of nausea vomiting diarrhea.  Patient report for the past 2 days she has had persistent nausea, vomiting multiple episodes of yellow emesis, as well as having recurrent nonbloody non mucousy loose stool.  Endorsed occasional cough from vomiting.  Endorsing chills but without any fever or bodyaches.  No runny nose sneezing sore throat no urinary symptoms no chest pains or shortness of breath and no significant abdominal pain.  No recent sick contact or eating any exotic food.  She mention previously she had similar episodes of nausea and vomiting and her potassium level was quite low.  This concerns her.  No recent antibiotic use.  On exam nontoxic in appearance.  Heart with mild tachycardia with a audible murmur heard from her prosthetic mitral valve.  Lungs otherwise clear, abdomen is soft nontender trace edema noted to bilateral lower extremities.  Vitals are notable for mild tachycardia with heart rate of 103.  Patient is afebrile no hypoxia.  Suspect viral etiology causing nausea vomiting diarrhea.  Will obtain COVID test, labs, will give IV fluid and antinausea medication, workup initiated.  -Labs ordered,  independently viewed and interpreted by me.  Labs remarkable for K+ of 2.6.  INR is 1.4.  Hgb 7.3 which is  a gram drop from a month ago.  Will transfuse blood and check hemoccult.  -The patient was maintained on a cardiac monitor.  I personally viewed and interpreted the cardiac monitored which showed an underlying rhythm of: sinus tachycardia -Imaging independently viewed and interpreted by me and I agree with radiologist's interpretation.  Result remarkable for acute abdominal series showing bronchitis changes with possible atelectasis vs infection, no SBO -This patient presents to the ED for concern of n/v/d, this involves an extensive number of treatment options, and is a complaint that carries with it a high risk of complications and morbidity.  The differential diagnosis includes viral illness, gi illness, stress response, infection, electrolytes abnormality -Co morbidities that complicate the patient evaluation includes CHF, mitral valve replacement,  -Treatment includes potassium supplementation, blood transfusion -Reevaluation of the patient after these medicines showed that the patient improved -PCP office notes or outside notes reviewed -Discussion with specialist [facility] Dr. Marisa Severin who agrees to admit pt -Escalation to admission/observation considered: patient is agreeable with hospital admission and blood transfusion.   11:38 AM Appreciate consultation from [facility] Dr. Marisa Severin who request an acute abdominal series to assess for potential SBO.  If negative, he'll admit pt.  Pt's last echo this year showing an EF of 60-65%.  Will give additional IVF.          Final Clinical Impression(s) / ED Diagnoses Final diagnoses:  Symptomatic anemia  Hypokalemia  Nausea vomiting and diarrhea    Rx / DC Orders ED Discharge Orders     None         Fayrene Helper, PA-C 02/20/23 1854    Gerhard Munch, MD 02/23/23 [phone]

## 2023-02-20 NOTE — ED Notes (Signed)
Pt tolerating transfusion well. Increased blood to 134ml/hr.

## 2023-02-20 NOTE — ED Triage Notes (Signed)
Pt states s/s been going on at least 2 days. N/V/D, states since midnight, she has vomited around 10 times ( bile now) and had diarrhea around 10 times as well. Was in hospital last mo for low K+ and not able to keep meds down now so worried about K+ levels again. Hx of CHF, IBS, on coumadin and fluid medication.

## 2023-02-20 NOTE — ED Notes (Signed)
Blood transfusion began at 1245. Staying in room with pt for 15 minutes for any s/s of reaction.

## 2023-02-20 NOTE — Plan of Care (Signed)
  Problem: Education: Goal: Knowledge of General Education information will improve Description Including pain rating scale, medication(s)/side effects and non-pharmacologic comfort measures Outcome: Progressing   

## 2023-02-20 NOTE — ED Notes (Signed)
Patient told us she obtained urine sample so orders were printed, then informed she could not provide any at this time, only had a bowel movement.

## 2023-02-20 NOTE — Progress Notes (Signed)
ANTICOAGULATION CONSULT NOTE - Initial Consult  Pharmacy Consult for Warfarin Indication:  mechanical mitral valve  No Known Allergies  Patient Measurements: Height: 5\' 8"  (172.7 cm) Weight: 113.4 kg (250 lb) IBW/kg (Calculated) : 63.9  Vital Signs: Temp: 98.4 F (36.9 C) (08/25 1712) Temp Source: Oral (08/25 1712) BP: 135/74 (08/25 1712) Pulse Rate: 100 (08/25 1712)  Labs: Recent Labs    02/20/23 0932 02/20/23 1029 02/20/23 1608  HGB  --  7.3*  --   HCT  --  26.5*  --   PLT  --  202  --   LABPROT 17.4*  --   --   INR 1.4*  --   --   CREATININE 0.99  --  0.92    Estimated Creatinine Clearance: 98.8 mL/min (by C-G formula based on SCr of 0.92 mg/dL).   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    02/19/2023   aspirin EC 81 MG tablet Take 1 tablet (81 mg total) by mouth daily. 90 tablet 3 02/17/2023 at 0900   diclofenac Sodium (VOLTAREN) 1 % GEL Apply topically.   Past Month   loperamide (IMODIUM) 2 MG capsule Take 4 mg by mouth 2 (two) times daily.   02/19/2023   metolazone (ZAROXOLYN) 2.5 MG tablet Take 1 tablet (2.5 mg total) by  mouth once a week. As needed (TAKE 30 MINUTES PRIOR TO TAKING TORSEMIDE) 12 tablet 1 Past Week   metoprolol tartrate (LOPRESSOR) 25 MG tablet Take 1 tablet (25 mg total) by mouth 2 (two) times daily. 180 tablet 1 02/19/2023   ondansetron (ZOFRAN-ODT) 4 MG disintegrating tablet Take 4 mg by mouth every 8 (eight) hours as needed for nausea or vomiting.   Past Month   potassium chloride SA (KLOR-CON M) 20 MEQ tablet Take 2 tablets (40 mEq total) by mouth 2 (two) times daily. 120 tablet 0 02/19/2023   promethazine (PHENERGAN) 25 MG tablet Take 25 mg by mouth every 6 (six) hours as needed.   02/18/2023   rosuvastatin (CRESTOR) 5 MG tablet Take 5 mg by mouth daily.   02/18/2023   sertraline (ZOLOFT) 100 MG tablet Take 200 mg by mouth daily.   02/19/2023   torsemide (DEMADEX) 20 MG tablet Take 80 mg by mouth 2 (two) times daily.   02/17/2023   traZODone (DESYREL) 100 MG tablet Take 200 mg by mouth at bedtime.    02/17/2023   warfarin (COUMADIN) 2 MG tablet TAKE 2 TABLETS ONCE A DAY OR AS DIRECTED BY COUMADIN CLINIC. (Patient taking differently: Take 2 mg by mouth See admin instructions. TAKE  2 mg on Monday,Wednesday, and Friday and 4 mg on Sunday,Tuesday, Thursday, and Saturday. AS DIRECTED BY COUMADIN CLINIC.) 65 tablet 5 02/19/2023 at 0900   albuterol (VENTOLIN HFA) 108 (90 Base) MCG/ACT inhaler Inhale 2 puffs into the lungs every 4 (four) hours as needed for wheezing or shortness of breath. (Patient not taking: Reported on 02/20/2023)   Not Taking    Assessment: 47 y.o. F presents with N/V and loose stools. Pt on warfarin PTA for mechanical mitral valve. Admission INR 1.4 - subtherapeutic. Hgb 7.2 on admission (noted usually 8-10) Home dose: 2mg  Mon/Wed/Fri and 4mg  all other days. Last dose PTA 8/24 a.m.  Goal of Therapy:  INR 2.5-3.5 Monitor platelets by anticoagulation protocol: Yes   Plan:  Daily INR Coumadin 6 mg tonight  Christoper Fabian, PharmD, BCPS Please see amion for complete clinical pharmacist  phone list 02/20/2023,6:39 PM

## 2023-02-20 NOTE — H&P (Signed)
Patient Demographics:    Gloria James, is a 47 y.o. female  MRN: [id number]   DOB - [birth year]  Admit Date - 02/20/2023  Outpatient Primary MD for the patient is Sasser, Clarene Critchley, MD   Assessment & Plan:   Assessment and Plan: 1)Possible Aspiration Pneumonia--in the setting of persistent emesis -Augmentin as ordered -As needed albuterol and Mucinex  2)Mechanical Mitral Valve--- INR subtherapeutic at 1.4 -Pharmacy consult for Coumadin adjustment requested  3) chronic diastolic dysfunction CHF -Echo from January 2024 with EF of 60 to 65%, -Hold diuretics in the setting of nausea vomiting and diarrhea  4)Persistent vomiting and diarrhea----antiemetics as needed -stool studies Chronic anticoagulation Daily PT/INR - [place] per pharmacy- INR subtherapeutic- 2.1, recommends starting Lovenox, bridge to warfarin to therapeutic INR 2.5-3.5- considering indication of mechanical valve.   5)Hypokalemia--due to GI losses and diuretic use -Replace IV and orally -Magnesium WNL  6)Class 2 Obesity- -Low calorie diet, portion control and increase physical activity discussed with patient -Body mass index is 38.01 kg/m.  Dispo: The patient is from: Home              Anticipated d/c is to: Home              Anticipated d/c date is: 1 day              Patient currently is not medically stable to d/c. 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   31 mm Sorin Carbomedics Optiform bileaflet mechanical valve via right mini thoracotomy approach   Tricuspid regurgitation       Past Surgical History:  Procedure Laterality Date   APPENDECTOMY     age 44   CARDIAC CATHETERIZATION  11/03/2017   MITRAL VALVE REPAIR Right 12/22/2017   Procedure: MINIMALLY INVASIVE MITRAL VALVE REPLACEMENT(MVR);  Surgeon: Purcell Nails, MD;  Location: [facility] OR;  Service: Open Heart Surgery;  Laterality: Right;   MULTIPLE EXTRACTIONS WITH ALVEOLOPLASTY N/A 12/06/2017   Procedure: Extraction of tooth #'s 4-6, 8-12, 20,22-29 and 32 with alveoloplasty;  Surgeon: Charlynne Pander, DDS;  Location: [facility] OR;  Service: Oral Surgery;  Laterality: N/A;   MULTIPLE TOOTH EXTRACTIONS  2015   OTHER SURGICAL HISTORY Left 2016   open surgery for kidney stones   RIGHT AND LEFT  HEART CATH N/A 11/03/2017   Procedure: RIGHT AND LEFT HEART CATH;  Surgeon: Kathleene Hazel, MD;  Location: [facility] INVASIVE CV LAB;  Service: Cardiovascular;  Laterality: N/A;   TEE WITHOUT CARDIOVERSION N/A 11/03/2017   Procedure: TRANSESOPHAGEAL ECHOCARDIOGRAM (TEE);  Surgeon: Lewayne Bunting, MD;  Location: [facility] ENDOSCOPY;  Service: Cardiovascular;  Laterality: N/A;   TEE WITHOUT CARDIOVERSION N/A 12/22/2017   Procedure: TRANSESOPHAGEAL ECHOCARDIOGRAM (TEE);  Surgeon: Purcell Nails, MD;  Location: [facility] OR;  Service: Open Heart Surgery;  Laterality: N/A;   TONSILLECTOMY     6    Chief Complaint  Patient presents with   Nausea   Emesis   Diarrhea      HPI:    Gloria James  is a 47 y.o. female with past medical history relevant for class II obesity, depression and anxiety disorder, chronic diastolic dysfunction CHF, status post prior mechanical mitral valve on chronic anticoagulation with  warfarin who presents to the ED with nausea vomiting and diarrhea since 02/17/2023 -Reports chills but no documented fevers =-No sick contacts at home, no recent travels patient does have some indoor dogs -No urinary symptoms -Patient's mom is at bedside, additional history obtained -Emesis is without blood, some bile -Reports some cough with posttussive emesis -Stools are without blood or mucus -Chest x-ray with bronchitic changes and possible right-sided aspiration pneumonia, abdominal x-rays without SBO -Potassium is low at 2.6 after replacement potassium is up to 2.9  -magnesium WNL -UA is not suggestive of UTI -Stool occult blood is negative -Hgb is down to 7.3 MCV and MCH are low -INR 1.4 -Lipase not elevated -COVID-19 is negative -LFTs are Not elevated  Review of systems:    In addition to the HPI above,   A full Review of  Systems was done, all other systems reviewed are negative except as noted above in HPI , .    Social History:  Reviewed by me    Social History   Tobacco Use   Smoking status: Former    Current packs/day: 0.00    Average packs/day: 0.5 packs/day for 26.0 years (13.0 ttl pk-yrs)    Types: Cigarettes    Start date: 12/18/1991    Quit date: 09/23/2017    Years since quitting: 5.4   Smokeless tobacco: Never  Substance Use Topics   Alcohol use: Not Currently     Family History :  Reviewed by me    Family History  Problem Relation Age of Onset   Cancer Father    Dementia Maternal Grandmother    Heart disease Maternal Grandfather        had open heart problems   Cancer Paternal Grandmother    Cancer Paternal Grandfather     Home Medications:   Prior to Admission medications   Medication Sig Start Date End Date Taking? Authorizing Provider  ALPRAZolam Prudy Feeler) 0.5 MG tablet Take 0.5 mg by mouth 4 (four) times daily as needed for anxiety.    Yes [provider]  aspirin EC 81 MG tablet Take 1 tablet (81 mg total) by mouth daily.  01/13/18  Yes BranchDorothe Pea, MD  diclofenac Sodium (VOLTAREN) 1 % GEL Apply topically. 08/14/22 08/14/23 Yes [provider]  loperamide (IMODIUM) 2 MG capsule Take 4 mg by mouth 2 (two) times daily. 12/28/22  Yes [provider]  metolazone (ZAROXOLYN) 2.5 MG tablet Take 1 tablet (2.5 mg total) by mouth once a week. As needed (TAKE 30 MINUTES PRIOR TO TAKING TORSEMIDE) 08/26/22  Yes Sharlene Dory, NP  metoprolol tartrate (LOPRESSOR) 25 MG tablet Take 1 tablet (25 mg total) by mouth 2 (two) times daily. 02/23/18  Yes BranchDorothe Pea, MD  ondansetron (ZOFRAN-ODT) 4 MG disintegrating tablet Take 4 mg by mouth every 8 (eight) hours as needed for nausea or vomiting. 12/28/22  Yes [provider]  potassium chloride SA (KLOR-CON M) 20 MEQ tablet Take 2 tablets (40 mEq total) by mouth 2 (two) times daily. 01/21/23 02/20/23 Yes Shah, Pratik D, DO  promethazine (PHENERGAN) 25 MG tablet Take 25 mg by mouth every 6 (six) hours as needed. 12/28/22  Yes [provider]  rosuvastatin (CRESTOR) 5 MG tablet Take 5 mg by mouth daily. 02/05/23  Yes [provider]  sertraline (ZOLOFT) 100 MG tablet Take 200 mg by mouth daily.   Yes [provider]  torsemide (DEMADEX) 20 MG tablet Take 80 mg by mouth 2 (two) times daily.   Yes [provider]  traZODone (DESYREL) 100 MG tablet Take 200 mg by mouth at bedtime.    Yes [provider]  warfarin (COUMADIN) 2 MG tablet TAKE 2 TABLETS ONCE A DAY OR AS DIRECTED BY COUMADIN CLINIC. Patient taking differently: Take 2 mg by mouth See admin instructions. TAKE 2 mg on Monday,Wednesday, and Friday and 4 mg on Sunday,Tuesday, Thursday, and Saturday. AS DIRECTED BY COUMADIN CLINIC. 11/08/22  Yes Branch, Dorothe Pea, MD  albuterol (VENTOLIN HFA) 108 (90 Base) MCG/ACT inhaler Inhale 2 puffs into the lungs every 4 (four) hours as needed for wheezing or shortness of breath. Patient not taking: Reported on 02/20/2023     [provider]     Allergies:    No Known Allergies   Physical Exam:   Vitals  Blood pressure 135/74, pulse 100, temperature 98.4 F (36.9 C), temperature source Oral, resp. rate 20, height 5\' 8"  (1.727 m), weight 113.4 kg, SpO2 100%.  Physical Examination: General appearance - alert,  in no distress , obese Mental status - alert, oriented to person, place, and time,  Eyes - sclera anicteric Neck - supple, no JVD elevation , Chest -fair air movement, no wheezing Heart - S1 and S2 normal, regular , metallic click Abdomen - soft, nontender, nondistended, +BS, increased truncal adiposity Neurological - screening mental status exam normal, neck supple without rigidity, cranial nerves II through XII intact, DTR's normal and symmetric Extremities - +ve pedal edema noted, intact peripheral pulses  Skin - warm, dry     Data Review:    CBC Recent Labs  Lab 02/20/23 1029  WBC 4.4  HGB 7.3*  HCT 26.5*  PLT 202  MCV 72.6*  MCH 20.0*  MCHC 27.5*  RDW 17.2*   ------------------------------------------------------------------------------------------------------------------  Chemistries  Recent Labs  Lab 02/20/23 0932 02/20/23 1138 02/20/23 1608  NA 136  --  137  K 2.6*  --  2.9*  CL 100  --  102  CO2 25  --  28  GLUCOSE 143*  --  111*  BUN 10  --  8  CREATININE 0.99  --  0.92  CALCIUM 9.1  --  9.0  MG  --  1.9  --   AST 23  --   --   ALT 19  --   --   ALKPHOS 47  --   --   BILITOT 0.9  --   --    ------------------------------------------------------------------------------------------------------------------ estimated creatinine clearance is 98.8 mL/min (by C-G formula based on SCr of 0.92 mg/dL). ------------------------------------------------------------------------------------------------------------------ Coagulation profile  Recent Labs  Lab 02/16/23 0850 02/20/23 0932  INR 4.7* 1.4*    ---------------------------------------------------------------------------------  Urinalysis    Component Value Date/Time   COLORURINE YELLOW 02/20/2023 1110   APPEARANCEUR HAZY (A) 02/20/2023 1110   LABSPEC 1.013 02/20/2023 1110   PHURINE 7.0 02/20/2023 1110   GLUCOSEU NEGATIVE 02/20/2023 1110   HGBUR NEGATIVE 02/20/2023 1110   BILIRUBINUR NEGATIVE 02/20/2023 1110   KETONESUR NEGATIVE 02/20/2023 1110   PROTEINUR 30 (A) 02/20/2023 1110   NITRITE NEGATIVE 02/20/2023 1110   LEUKOCYTESUR TRACE (A) 02/20/2023 1110    ----------------------------------------------------------------------------------------------------------------   Imaging Results:    DG ABD ACUTE 2+V W 1V CHEST  Result Date: 02/20/2023 CLINICAL DATA:  Nausea, vomiting, and diarrhea. EXAM: DG ABDOMEN ACUTE WITH 1 VIEW CHEST COMPARISON:  Chest radiograph 12/28/2022. CT abdomen and pelvis 10/05/2022. FINDINGS: The cardiomediastinal silhouette is unchanged as post mitral valve repair. There is mildly increased peribronchial thickening, and there are mildly increased densities in the right mid lung and both lung bases. No overt pulmonary edema, sizable pleural effusion, or pneumothorax is identified. No intraperitoneal free air is identified. A small amount of bowel gas is scattered throughout the abdomen. No dilated loops of bowel are seen to suggest obstruction. An IUD projects over the midline of the pelvis. No acute osseous abnormality is seen. IMPRESSION: 1. Bronchitic changes with mildly increased densities in the right mid lung and both lung bases which could reflect atelectasis or infection. 2. No evidence of bowel obstruction. Electronically Signed   By: Sebastian Ache M.D.   On: 02/20/2023 12:39    Radiological Exams on Admission: DG ABD ACUTE 2+V W 1V CHEST  Result Date: 02/20/2023 CLINICAL DATA:  Nausea, vomiting, and diarrhea. EXAM: DG ABDOMEN ACUTE WITH 1 VIEW CHEST COMPARISON:  Chest radiograph 12/28/2022. CT  abdomen and pelvis 10/05/2022. FINDINGS: The cardiomediastinal silhouette is unchanged as post mitral valve repair. There is mildly increased peribronchial thickening, and there are mildly increased densities in the right mid lung and both lung bases. No overt pulmonary edema, sizable pleural effusion, or pneumothorax is identified. No intraperitoneal free air is identified. A small amount of bowel gas is scattered throughout the abdomen. No dilated loops of bowel are seen to suggest obstruction. An IUD projects over the midline of the pelvis. No acute osseous abnormality is seen. IMPRESSION: 1. Bronchitic changes with mildly increased densities in the right mid lung and both lung bases which could reflect atelectasis or infection. 2. No evidence of bowel obstruction. Electronically Signed   By: Sebastian Ache M.D.   On: 02/20/2023 12:39    DVT Prophylaxis -SCD /Coumadin AM Labs Ordered, also please review Full Orders  Family Communication: Admission, patients condition and plan of care including tests being ordered have been discussed with the patient and mother who indicate understanding and agree with the plan   Condition   stable  Shon Hale M.D on 02/20/2023 at 6:38 PM Go to [email or web address] -  for contact info  [facility] - Office  [phone]

## 2023-02-21 ENCOUNTER — Encounter (HOSPITAL_COMMUNITY): Payer: Self-pay | Admitting: Family Medicine

## 2023-02-21 DIAGNOSIS — E876 Hypokalemia: Secondary | ICD-10-CM | POA: Diagnosis not present

## 2023-02-21 DIAGNOSIS — R1115 Cyclical vomiting syndrome unrelated to migraine: Secondary | ICD-10-CM | POA: Diagnosis not present

## 2023-02-21 LAB — GASTROINTESTINAL PANEL BY PCR, STOOL (REPLACES STOOL CULTURE)

## 2023-02-21 LAB — CBC
HCT: 28.4 % — ABNORMAL LOW (ref 36.0–46.0)
Hemoglobin: 7.9 g/dL — ABNORMAL LOW (ref 12.0–15.0)
MCH: 20.6 pg — ABNORMAL LOW (ref 26.0–34.0)
MCHC: 27.8 g/dL — ABNORMAL LOW (ref 30.0–36.0)
MCV: 74.2 fL — ABNORMAL LOW (ref 80.0–100.0)
Platelets: 235 10*3/uL (ref 150–400)
RBC: 3.83 MIL/uL — ABNORMAL LOW (ref 3.87–5.11)
RDW: 18 % — ABNORMAL HIGH (ref 11.5–15.5)
WBC: 6.2 10*3/uL (ref 4.0–10.5)
nRBC: 0.6 % — ABNORMAL HIGH (ref 0.0–0.2)

## 2023-02-21 LAB — TYPE AND SCREEN
ABO/RH(D): O POS
Antibody Screen: NEGATIVE
Unit division: 0

## 2023-02-21 LAB — COMPREHENSIVE METABOLIC PANEL
ALT: 24 U/L (ref 0–44)
AST: 32 U/L (ref 15–41)
Albumin: 3.8 g/dL (ref 3.5–5.0)
Alkaline Phosphatase: 48 U/L (ref 38–126)
Anion gap: 8 (ref 5–15)
BUN: 9 mg/dL (ref 6–20)
CO2: 24 mmol/L (ref 22–32)
Calcium: 9.2 mg/dL (ref 8.9–10.3)
Chloride: 105 mmol/L (ref 98–111)
Creatinine, Ser: 0.96 mg/dL (ref 0.44–1.00)
GFR, Estimated: >60 mL/min (ref >60)
Glucose, Bld: 129 mg/dL — ABNORMAL HIGH (ref 70–99)
Potassium: 3.8 mmol/L (ref 3.5–5.1)
Sodium: 137 mmol/L (ref 135–145)
Total Bilirubin: 0.8 mg/dL (ref 0.3–1.2)
Total Protein: 7.1 g/dL (ref 6.5–8.1)

## 2023-02-21 LAB — BPAM RBC
Blood Product Expiration Date: 202409252359
ISSUE DATE / TIME: 202408251222
Unit Type and Rh: 5100

## 2023-02-21 LAB — PROTIME-INR
INR: 1.3 — ABNORMAL HIGH (ref 0.8–1.2)
Prothrombin Time: 16.5 s — ABNORMAL HIGH (ref 11.4–15.2)

## 2023-02-21 MED ORDER — AMOXICILLIN-POT CLAVULANATE 875-125 MG PO TABS: 1.0000 | ORAL_TABLET | Freq: Two times a day (BID) | ORAL | 0 refills | Status: AC

## 2023-02-21 MED ORDER — POTASSIUM CHLORIDE CRYS ER 20 MEQ PO TBCR
40.0000 meq | EXTENDED_RELEASE_TABLET | Freq: Once | ORAL | Status: AC
  Administered 2023-02-21: 40 meq via ORAL
  Filled 2023-02-21: qty 2

## 2023-02-21 MED ORDER — ALBUTEROL SULFATE HFA 108 (90 BASE) MCG/ACT IN AERS: 2.0000 | INHALATION_SPRAY | RESPIRATORY_TRACT | 1 refills | Status: AC | PRN

## 2023-02-21 MED ORDER — ALUM & MAG HYDROXIDE-SIMETH 200-200-20 MG/5ML PO SUSP
30.0000 mL | Freq: Four times a day (QID) | ORAL | Status: DC | PRN
  Administered 2023-02-21 (×3): 30 mL via ORAL
  Filled 2023-02-21 (×2): qty 30

## 2023-02-21 MED ORDER — METOPROLOL TARTRATE 25 MG PO TABS: 25.0000 mg | ORAL_TABLET | Freq: Two times a day (BID) | ORAL | 1 refills | Status: DC

## 2023-02-21 MED ORDER — WARFARIN SODIUM 2 MG PO TABS: 4.0000 mg | ORAL_TABLET | Freq: Once | ORAL | Status: DC

## 2023-02-21 MED ORDER — ONDANSETRON 4 MG PO TBDP: 4.0000 mg | ORAL_TABLET | Freq: Three times a day (TID) | ORAL | 1 refills | Status: DC | PRN

## 2023-02-21 MED ORDER — POTASSIUM CHLORIDE CRYS ER 20 MEQ PO TBCR: 40.0000 meq | EXTENDED_RELEASE_TABLET | Freq: Two times a day (BID) | ORAL | 0 refills | Status: AC

## 2023-02-21 MED ORDER — WARFARIN SODIUM 5 MG PO TABS
5.0000 mg | ORAL_TABLET | Freq: Once | ORAL | Status: AC
  Administered 2023-02-21: 5 mg via ORAL
  Filled 2023-02-21: qty 1

## 2023-02-21 MED ORDER — METOLAZONE 2.5 MG PO TABS: 2.5000 mg | ORAL_TABLET | ORAL | 1 refills | Status: DC

## 2023-02-21 MED ORDER — LOPERAMIDE HCL 2 MG PO CAPS: 4.0000 mg | ORAL_CAPSULE | Freq: Three times a day (TID) | ORAL | 2 refills | Status: AC | PRN

## 2023-02-21 MED ORDER — WARFARIN SODIUM 7.5 MG PO TABS: 7.5000 mg | ORAL_TABLET | Freq: Once | ORAL | Status: DC

## 2023-02-21 MED ORDER — WARFARIN - PHARMACIST DOSING INPATIENT: Freq: Every day | Status: DC

## 2023-02-21 NOTE — Progress Notes (Signed)
   02/21/23 1159  TOC Brief Assessment  Insurance and Status Reviewed  Patient has primary care physician Yes  Home environment has been reviewed Home with Spouse  Prior level of function: independemnt  Prior/Current Home Services No current home services  Social Determinants of Health Reivew SDOH reviewed no interventions necessary  Readmission risk has been reviewed Yes  Transition of care needs no transition of care needs at this time   Already up for discharge. No needs .

## 2023-02-21 NOTE — Progress Notes (Addendum)
ANTICOAGULATION CONSULT NOTE -  Pharmacy Consult for Warfarin/lovenox Indication:  mechanical mitral valve  No Known Allergies  Patient Measurements: Height: 5\' 8"  (172.7 cm) Weight: 113.4 kg (250 lb) IBW/kg (Calculated) : 63.9  Vital Signs: Temp: 98 F (36.7 C) (08/26 0532) Temp Source: Oral (08/26 0532) BP: 129/82 (08/26 0532) Pulse Rate: 97 (08/26 0532)  Labs: Recent Labs    02/20/23 0932 02/20/23 1029 02/20/23 1608 02/21/23 0517  HGB  --  7.3*  --  7.9*  HCT  --  26.5*  --  28.4*  PLT  --  202  --  235  LABPROT 17.4*  --   --  16.5*  INR 1.4*  --   --  1.3*  CREATININE 0.99  --  0.92 0.96    Estimated Creatinine Clearance: 94.7 mL/min (by C-G formula based on SCr of 0.96 mg/dL).   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    02/19/2023   aspirin EC 81 MG tablet Take 1 tablet (81 mg total) by mouth daily. 90 tablet 3 02/17/2023 at 0900   diclofenac Sodium (VOLTAREN) 1 % GEL Apply topically.   Past Month   loperamide (IMODIUM) 2 MG capsule Take 4 mg by mouth 2 (two) times daily.   02/19/2023   metolazone (ZAROXOLYN) 2.5 MG  tablet Take 1 tablet (2.5 mg total) by mouth once a week. As needed (TAKE 30 MINUTES PRIOR TO TAKING TORSEMIDE) 12 tablet 1 Past Week   metoprolol tartrate (LOPRESSOR) 25 MG tablet Take 1 tablet (25 mg total) by mouth 2 (two) times daily. 180 tablet 1 02/19/2023   ondansetron (ZOFRAN-ODT) 4 MG disintegrating tablet Take 4 mg by mouth every 8 (eight) hours as needed for nausea or vomiting.   Past Month   potassium chloride SA (KLOR-CON M) 20 MEQ tablet Take 2 tablets (40 mEq total) by mouth 2 (two) times daily. 120 tablet 0 02/19/2023   promethazine (PHENERGAN) 25 MG tablet Take 25 mg by mouth every 6 (six) hours as needed.   02/18/2023   rosuvastatin (CRESTOR) 5 MG tablet Take 5 mg by mouth daily.   02/18/2023   sertraline (ZOLOFT) 100 MG tablet Take 200 mg by mouth daily.   02/19/2023   torsemide (DEMADEX) 20 MG tablet Take 80 mg by mouth 2 (two) times daily.   02/17/2023   traZODone (DESYREL) 100 MG tablet Take 200 mg by mouth at bedtime.    02/17/2023   warfarin (COUMADIN) 2 MG tablet TAKE 2 TABLETS ONCE A DAY OR AS DIRECTED BY COUMADIN CLINIC. (Patient taking differently: Take 2 mg  by mouth See admin instructions. TAKE 2 mg on Monday,Wednesday, and Friday and 4 mg on Sunday,Tuesday, Thursday, and Saturday. AS DIRECTED BY COUMADIN CLINIC.) 65 tablet 5 02/19/2023 at 0900   albuterol (VENTOLIN HFA) 108 (90 Base) MCG/ACT inhaler Inhale 2 puffs into the lungs every 4 (four) hours as needed for wheezing or shortness of breath. (Patient not taking: Reported on 02/20/2023)   Not Taking    Assessment: 47 y.o. F presents with N/V and loose stools. Pt on warfarin PTA for mechanical mitral valve. Home dose: 2 mg Mon/Wed/Fri and 4 mg all other days.  INR 1.4> 1.3 - subtherapeutic  Hgb 7.2> 7.9  Lovenox/heparin bridge would be recommended in this case since she has mechanical mitral valve and high risk for clots but patient refusing.  Goal of Therapy:  INR 2.5-3.5 Monitor platelets by anticoagulation protocol:  Yes   Plan:  Warfarin 5 mg x1 dose Patient is being discharged today but refusing to take recommended lovenox bridge.  Judeth Cornfield, PharmD Clinical Pharmacist 02/21/2023 11:38 AM

## 2023-02-21 NOTE — Discharge Summary (Signed)
Gloria James, is a 47 y.o. female  DOB [birth year]  MRN [id number].  Admission date:  02/20/2023  Admitting Physician  Shon Hale, MD  Discharge Date:  02/21/2023   Primary MD  Estanislado Pandy, MD  Recommendations for primary care physician for things to follow:   1)Take Coumadin/Warfarin 4 mg every day starting on Tuesday 02/22/2023 thru Thursday 02/24/2023 , then go back to usual schedule of 2 mg Monday Wednesday Fridays and 4 mg on other days after that -Repeat PT/INR test on Friday, 02/25/2023  2)Repeat BMP and CBC on Friday, 02/25/2023  3)Avoid ibuprofen/Advil/Aleve/Motrin/Goody Powders/Naproxen/BC powders/Meloxicam/Diclofenac/Indomethacin and other Nonsteroidal anti-inflammatory medications as these will make you more likely to bleed and can cause stomach ulcers, can also cause Kidney problems.   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      Past Medical History:  Diagnosis Date   Acute pulmonary edema (HCC)    Anemia    CHF (congestive heart failure) (HCC)    Chronic diastolic (congestive) heart failure (HCC)    Depression    Dyspnea    Dysrhythmia    Patient states she would have episodes for tachycardia in the past   History of kidney stones    Panic disorder without agoraphobia    PONV (postoperative nausea and vomiting)    Precordial pain    Rheumatic mitral  stenosis with regurgitation    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 12/22/2017   31 mm Sorin Carbomedics Optiform bileaflet mechanical valve via right mini thoracotomy approach   Tricuspid regurgitation     Past Surgical History:  Procedure Laterality Date   APPENDECTOMY     age 20   CARDIAC CATHETERIZATION  11/03/2017   MITRAL VALVE REPAIR Right 12/22/2017   Procedure: MINIMALLY INVASIVE MITRAL VALVE REPLACEMENT(MVR);  Surgeon: Purcell Nails, MD;  Location: [facility] OR;  Service: Open Heart Surgery;  Laterality: Right;   MULTIPLE EXTRACTIONS WITH ALVEOLOPLASTY N/A 12/06/2017   Procedure: Extraction of tooth #'s 4-6, 8-12, 20,22-29 and 32 with alveoloplasty;  Surgeon: Charlynne Pander, DDS;  Location: [facility] OR;  Service: Oral Surgery;  Laterality: N/A;   MULTIPLE TOOTH EXTRACTIONS  2015   OTHER SURGICAL HISTORY Left 2016   open surgery for kidney stones   RIGHT AND LEFT HEART CATH N/A 11/03/2017   Procedure: RIGHT AND LEFT HEART CATH;  Surgeon: Kathleene Hazel, MD;  Location: [facility] INVASIVE CV LAB;  Service: Cardiovascular;  Laterality: N/A;   TEE WITHOUT CARDIOVERSION N/A 11/03/2017   Procedure: TRANSESOPHAGEAL ECHOCARDIOGRAM (TEE);  Surgeon: Lewayne Bunting, MD;  Location: [facility] ENDOSCOPY;  Service: Cardiovascular;  Laterality: N/A;   TEE WITHOUT CARDIOVERSION N/A 12/22/2017   Procedure: TRANSESOPHAGEAL ECHOCARDIOGRAM (TEE);  Surgeon: Purcell Nails, MD;  Location: [facility] OR;  Service: Open Heart Surgery;  Laterality: N/A;   TONSILLECTOMY     6     HPI  from the history and physical done on the day of admission:     Gloria James  is a 47 y.o. female with past medical history relevant for class II obesity, depression and anxiety disorder, chronic diastolic dysfunction CHF, status post prior mechanical mitral valve on chronic anticoagulation with warfarin who presents to the ED with nausea vomiting and diarrhea since 02/17/2023 -Reports chills but no  documented fevers =-No sick contacts at home, no recent travels patient does have some indoor dogs -No urinary symptoms -Patient's mom is at bedside, additional history obtained -Emesis is without blood, some bile -Reports some cough with posttussive emesis -Stools are without blood or mucus -Chest x-ray with bronchitic changes and possible right-sided aspiration pneumonia, abdominal x-rays without SBO -Potassium is low at 2.6 after replacement potassium is up to 2.9  -magnesium WNL -UA is not suggestive of UTI -Stool occult blood is negative -Hgb is down to 7.3 MCV and MCH are low -INR 1.4 -Lipase not elevated -COVID-19 is negative -LFTs are Not elevated    Hospital Course:     1)Possible Aspiration Pneumonia--in the setting of persistent emesis -Cough and respiratory symptoms are improving -Okay to complete Augmentin as ordered -As needed albuterol and Mucinex   2)Mechanical Mitral Valve--- INR subtherapeutic at 1.4 -Pharmacy consult for Coumadin adjustment requested -Patient with mechanical mitral valve and subtherapeutic INR -Despite persuasion from myself and pharmacist patient refuses Lovenox bridge -Risk versus benefits of Lovenox bridge discussed with patient patient continues to refuse Lovenox bridge -Take Coumadin/Warfarin 4 mg every day starting on Tuesday 02/22/2023 thru Thursday 02/24/2023 , then go back to usual schedule of 2 mg Monday Wednesday Fridays and 4 mg on other days after that -Repeat PT/INR test on Friday, 02/25/2023   3) chronic diastolic dysfunction CHF -Echo from January 2024 with EF of 60 to 65%, -Okay to resume PTA diuretics   4)Persistent vomiting and diarrhea----antiemetics as needed -stool studies Chronic anticoagulation Daily PT/INR - Wafarin per pharmacy- INR subtherapeutic- 2.1, recommends starting Lovenox, bridge to warfarin to therapeutic INR 2.5-3.5- considering indication of mechanical valve. -Patient declined Lovenox bridge, please see  #2 above   5)Hypokalemia--due to GI losses and diuretic use -Replaced IV and orally---potassium normalized with replacement -Magnesium WNL   6)Class 2 Obesity- -Low calorie diet, portion control and increase physical activity discussed with patient -Body mass index is 38.01 kg/m.   Dispo: The patient is from: Home              Anticipated d/c is to: Home  Discharge Condition: Stable  Follow UP----PCP   Consults obtained -pharmacy  Diet and Activity recommendation:  As advised  Discharge Instructions   Discharge Instructions     Call MD for:  difficulty breathing, headache or visual disturbances   Complete by: As directed    Call MD for:  persistant dizziness or light-headedness   Complete by: As directed    Call MD for:  persistant nausea and vomiting   Complete by: As directed    Call MD for:  temperature >100.4   Complete by: As directed    Diet - low sodium heart healthy   Complete by: As directed    Discharge  instructions   Complete by: As directed    1)Take Coumadin/Warfarin 4 mg every day starting on Tuesday 02/22/2023 thru Thursday 02/24/2023 , then go back to usual schedule of 2 mg Monday Wednesday Fridays and 4 mg on other days after that -Repeat PT/INR test on Friday, 02/25/2023  2)Repeat BMP and CBC on Friday, 02/25/2023  3)Avoid ibuprofen/Advil/Aleve/Motrin/Goody Powders/Naproxen/BC powders/Meloxicam/Diclofenac/Indomethacin and other Nonsteroidal anti-inflammatory medications as these will make you more likely to bleed and can cause stomach ulcers, can also cause Kidney problems.   4) please make sure you are taking  potassium as prescribed   Increase activity slowly   Complete by: As directed         Discharge Medications     Allergies as of 02/21/2023   No Known Allergies      Medication List     STOP taking these medications    aspirin EC 81 MG tablet       TAKE these medications    albuterol 108 (90 Base) MCG/ACT inhaler Commonly  known as: VENTOLIN HFA Inhale 2 puffs into the lungs every 4 (four) hours as needed for wheezing or shortness of breath.   ALPRAZolam 0.5 MG tablet Commonly known as: XANAX Take 0.5 mg by mouth 4 (four) times daily as needed for anxiety.   amoxicillin-clavulanate 875-125 MG tablet Commonly known as: AUGMENTIN Take 1 tablet by mouth 2 (two) times daily for 5 days.   diclofenac Sodium 1 % Gel Commonly known as: VOLTAREN Apply topically.   loperamide 2 MG capsule Commonly known as: IMODIUM Take 2 capsules (4 mg total) by mouth 3 (three) times daily as needed for diarrhea or loose stools. What changed:  when to take this reasons to take this   metolazone 2.5 MG tablet Commonly known as: ZAROXOLYN Take 1 tablet (2.5 mg total) by mouth every Monday. As needed (TAKE 30 MINUTES PRIOR TO TAKING TORSEMIDE) Start taking on: February 28, 2023 What changed: when to take this   metoprolol tartrate 25 MG tablet Commonly known as: LOPRESSOR Take 1 tablet (25 mg total) by mouth 2 (two) times daily.   ondansetron 4 MG disintegrating tablet Commonly known as: ZOFRAN-ODT Take 1 tablet (4 mg total) by mouth every 8 (eight) hours as needed for nausea or vomiting.   potassium chloride SA 20 MEQ tablet Commonly known as: KLOR-CON M Take 2 tablets (40 mEq total) by mouth 2 (two) times daily. Take while Taking Lasix/Furosemide What changed: additional instructions   promethazine 25 MG tablet Commonly known as: PHENERGAN Take 25 mg by mouth every 6 (six) hours as needed.   rosuvastatin 5 MG tablet Commonly known as: CRESTOR Take 5 mg by mouth daily.   sertraline 100 MG tablet Commonly known as: ZOLOFT Take 200 mg by mouth daily.   torsemide 20 MG tablet Commonly known as: DEMADEX Take 80 mg by mouth 2 (two) times daily.   traZODone 100 MG tablet Commonly known as: DESYREL Take 200 mg by mouth at bedtime.   warfarin 2 MG tablet Commonly known as: COUMADIN Take as directed. If you  are unsure how to take this medication, talk to your nurse or doctor. Original instructions: TAKE 2 TABLETS ONCE A DAY OR AS DIRECTED BY COUMADIN CLINIC. What changed: See the new instructions.       Major procedures and Radiology Reports - PLEASE review detailed and final reports for all details, in brief -   DG ABD ACUTE 2+V W 1V CHEST  Result Date: 02/20/2023 CLINICAL DATA:  Nausea, vomiting, and diarrhea. EXAM: DG ABDOMEN ACUTE WITH 1 VIEW CHEST COMPARISON:  Chest radiograph 12/28/2022. CT abdomen and pelvis 10/05/2022. FINDINGS: The cardiomediastinal silhouette is unchanged as post mitral valve repair. There is mildly increased peribronchial thickening, and there are mildly increased densities in the right mid lung and both lung bases. No overt pulmonary edema, sizable pleural effusion, or pneumothorax is identified. No intraperitoneal free air is identified. A small amount of bowel gas is scattered throughout the abdomen. No dilated loops of bowel are seen to suggest obstruction. An IUD projects over the midline of the pelvis. No acute osseous abnormality is seen. IMPRESSION: 1. Bronchitic changes with mildly increased densities in the right mid lung and both lung bases which could reflect atelectasis or infection. 2. No evidence of bowel obstruction. Electronically Signed   By: Sebastian Ache M.D.   On: 02/20/2023 12:39    Micro Results  Recent Results (from the past 240 hour(s))  SARS Coronavirus 2 by RT PCR (hospital order, performed in [facility] hospital lab) *cepheid single result test* Anterior Nasal Swab     Status: None   Collection Time: 02/20/23  9:01 AM   Specimen: Anterior Nasal Swab  Result Value Ref Range Status   SARS Coronavirus 2 by RT PCR NEGATIVE NEGATIVE Final    Comment: (NOTE) SARS-CoV-2 target nucleic acids are NOT DETECTED.  The SARS-CoV-2 RNA is generally detectable in upper and lower respiratory specimens during the acute phase of infection. The  lowest concentration of SARS-CoV-2 viral copies this assay can detect is 250 copies / mL. A negative result does not preclude SARS-CoV-2 infection and should not be used as the sole basis for treatment or other patient management decisions.  A negative result may occur with improper specimen collection / handling, submission of specimen other than nasopharyngeal swab, presence of viral mutation(s) within the areas targeted by this assay, and inadequate number of viral copies (<250 copies / mL). A negative result must be combined with clinical observations, patient history, and epidemiological information.  Fact Sheet for Patients:   RoadLapTop.co.za  Fact Sheet for Healthcare Providers: http://kim-miller.com/  This test is not yet approved or  cleared by the [place] FDA and has been authorized for detection and/or diagnosis of SARS-CoV-2 by FDA under an Emergency Use Authorization (EUA).  This EUA will remain in effect (meaning this test can be used) for the duration of the COVID-19 declaration under Section 564(b)(1) of the Act, 21 U.S.C. section 360bbb-3(b)(1), unless the authorization is terminated or revoked sooner.  Performed at [facility], [address], [place], [place] [address]     Today   Subjective    Arreon Arend today has no new complaints -Cough or respiratory symptoms improving No fever  Or chills   No Nausea, Vomiting or Diarrhea -- Eating and drinking well     Patient has been seen and examined prior to discharge   Objective   Blood pressure 129/82, pulse 97, temperature 98 F (36.7 C), temperature source Oral, resp. rate 17, height 5\' 8"  (1.727 m), weight 113.4 kg, SpO2 100%.   Intake/Output Summary (Last 24 hours) at 02/21/2023 1136 Last data filed at 02/21/2023 0349 Gross per 24 hour  Intake 1714.84 ml  Output --  Net 1714.84 ml    Exam Gen:- Awake Alert, no acute distress  HEENT:-  [place].AT, No sclera icterus Neck-Supple Neck,No JVD,.  Lungs-fair symmetrical air movement without wheezing or rhonchi at this time  CV- S1, S2 normal, regular, metallic  valve click Abd-  +ve B.Sounds, Abd Soft, No tenderness,   increased truncal adiposity Extremity/Skin:- No  edema,   good pulses Psych-affect is appropriate, oriented x3 Neuro-no new focal deficits, no tremors    Data Review   CBC w Diff:  Lab Results  Component Value Date   WBC 6.2 02/21/2023   HGB 7.9 (L) 02/21/2023   HCT 28.4 (L) 02/21/2023   PLT 235 02/21/2023   LYMPHOPCT 9 01/19/2023   MONOPCT 4 01/19/2023   EOSPCT 1 01/19/2023   BASOPCT 0 01/19/2023    CMP:  Lab Results  Component Value Date   NA 137 02/21/2023   K 3.8 02/21/2023   CL 105 02/21/2023   CO2 24 02/21/2023   BUN 9 02/21/2023   CREATININE 0.96 02/21/2023   PROT 7.1 02/21/2023   ALBUMIN 3.8 02/21/2023   BILITOT 0.8 02/21/2023   ALKPHOS 48 02/21/2023   AST 32 02/21/2023   ALT 24 02/21/2023  .  Total Discharge time is about 33 minutes  Shon Hale M.D on 02/21/2023 at 11:36 AM  Go to [email or web address] -  for contact info  [facility] - Office  [phone]

## 2023-02-21 NOTE — Discharge Instructions (Signed)
1)Take Coumadin/Warfarin 4 mg every day starting on Tuesday 02/22/2023 thru Thursday 02/24/2023 , then go back to usual schedule of 2 mg Monday Wednesday Fridays and 4 mg on other days after that -Repeat PT/INR test on Friday, 02/25/2023  2)Repeat BMP and CBC on Friday, 02/25/2023  3)Avoid ibuprofen/Advil/Aleve/Motrin/Goody Powders/Naproxen/BC powders/Meloxicam/Diclofenac/Indomethacin and other Nonsteroidal anti-inflammatory medications as these will make you more likely to bleed and can cause stomach ulcers, can also cause Kidney problems.   4) please make sure you are taking  potassium as prescribed

## 2023-02-21 NOTE — Progress Notes (Signed)
-  Patient with mechanical mitral valve and subtherapeutic INR - Despite persuasion from myself and pharmacist patient refuses Lovenox bridge - Risk versus benefits of Lovenox bridge discussed with patient patient continues to refuse Lovenox bridge - Please see discharge summary for Coumadin dosing  Shon Hale, MD

## 2023-02-21 NOTE — Plan of Care (Signed)

## 2023-03-03 ENCOUNTER — Ambulatory Visit: Payer: Medicaid Other | Attending: Cardiology | Admitting: *Deleted

## 2023-03-03 DIAGNOSIS — Z5181 Encounter for therapeutic drug level monitoring: Secondary | ICD-10-CM

## 2023-03-03 DIAGNOSIS — Z954 Presence of other heart-valve replacement: Secondary | ICD-10-CM

## 2023-03-03 LAB — POCT INR: INR: 2.9 (ref 2.0–3.0)

## 2023-03-03 NOTE — Patient Instructions (Signed)
Continue warfarin 1 tablet daily except 2 tablets on Sundays, Tuesdays and Thursdays Continue greens Recheck in 2 weeks

## 2023-03-11 NOTE — Progress Notes (Unsigned)
Referring Provider:Sasser, Clarene Critchley, MD Primary Care Physician:  Estanislado Pandy, MD Primary Gastroenterologist:  Dr. Marletta Lor  Chief Complaint  Patient presents with   Anemia    Colonoscopy screening    HPI:   Gloria James is a 47 y.o. female with history of depression, anxiety, chronic diastolic dysfunction CHF, mechanical mitral valve on warfarin, presenting today at the request of Sasser, Clarene Critchley, MD for anemia due to GI blood loss.  Patient was admitted 02/20/2023 - 02/21/2023 after presenting with nausea, vomiting, diarrhea x 3 days.  Hemoglobin was 7.3, stool occult blood was negative, and patient denied overt GI bleeding.  Chest/abd X-ray with bronchitic changes and possible right-sided aspiration pneumonia.  She was treated with Augmentin.  She also received 1 unit PRBCs.  Hemoglobin was 7.9 on discharge.  Per chart review, looks like patient has chronic anemia.  Earliest lab in EMR is  March 2019 with hemoglobin of 10.9.  Hemoglobin was 10.2 on 01/19/2023, day of a hospital admission. During that admission, her hemoglobin declined to 8.2 on 7/26 in the setting of IV fluids.  Hemoglobin was 7.3 when she returned to the ER on 8/25.  Iron deficiency.   Today:  Reports having blood work with PCP about 2 weeks ago and was told that his hemoglobin was improving.  She was started on oral iron at that time.  Reviewed labs in [facility] dated 03/04/2023.  Hemoglobin 8.6, iron 25, iron saturation 5%, ferritin 12, B12 663, folate 3.5  Reports chronic, intermittent bright red blood per rectum, but this is rare overall.  Reports having mild constipation at times with known hemorrhoids.  She has noted dark stools since starting iron, but not prior and less taking Pepto-Bismol. No abdominal pain, nausea, vomiting aside from when she presented to the hospital in August.  She thinks she had a virus at that time as she has no chronic symptoms.   She has noticed heartburn at least 2-3 times a week for the  last couple of months. GERD is chronic, but was not bothering her much until recently.  Does not consume much fried/fatty foods.  No spicy foods.  Rare soda.  No NSAIDs.  Occasional solid food dysphagia. Has poor dentition. No trouble with pills. This has been present for a couple years. No regurgitation.  Items will pass with liquids.     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   31 mm Sorin Carbomedics Optiform bileaflet mechanical valve via right mini thoracotomy approach   Tricuspid regurgitation     Past Surgical History:  Procedure Laterality Date   APPENDECTOMY     age 47   CARDIAC CATHETERIZATION  11/03/2017   MITRAL VALVE REPAIR Right 12/22/2017   Procedure: MINIMALLY INVASIVE MITRAL VALVE REPLACEMENT(MVR);  Surgeon: Purcell Nails, MD;  Location: [facility] OR;  Service: Open Heart Surgery;  Laterality: Right;   MULTIPLE EXTRACTIONS WITH ALVEOLOPLASTY N/A  12/06/2017   Procedure: Extraction of tooth #'s 4-6, 8-12, 20,22-29 and 32 with alveoloplasty;  Surgeon: Charlynne Pander, DDS;  Location: [facility] OR;  Service: Oral Surgery;  Laterality: N/A;   MULTIPLE TOOTH EXTRACTIONS  2015   OTHER SURGICAL HISTORY Left 2016   open surgery for kidney stones   RIGHT AND LEFT HEART CATH N/A 11/03/2017   Procedure: RIGHT AND LEFT HEART CATH;  Surgeon: Kathleene Hazel, MD;  Location: [facility] INVASIVE CV LAB;  Service: Cardiovascular;  Laterality: N/A;   TEE WITHOUT CARDIOVERSION N/A 11/03/2017    Procedure: TRANSESOPHAGEAL ECHOCARDIOGRAM (TEE);  Surgeon: Lewayne Bunting, MD;  Location: [facility] ENDOSCOPY;  Service: Cardiovascular;  Laterality: N/A;   TEE WITHOUT CARDIOVERSION N/A 12/22/2017   Procedure: TRANSESOPHAGEAL ECHOCARDIOGRAM (TEE);  Surgeon: Purcell Nails, MD;  Location: [facility] OR;  Service: Open Heart Surgery;  Laterality: N/A;   TONSILLECTOMY     6    Current Outpatient Medications  Medication Sig Dispense Refill   albuterol (VENTOLIN HFA) 108 (90 Base) MCG/ACT inhaler Inhale 2 puffs into the lungs every 4 (four) hours as needed for wheezing or shortness of breath. 18 g 1   ALPRAZolam (XANAX) 0.5 MG tablet Take 0.5 mg by mouth 4 (four) times daily as needed for anxiety.      cyclobenzaprine (FLEXERIL) 10 MG tablet Take 10 mg by mouth at bedtime.     diclofenac Sodium (VOLTAREN) 1 % GEL Apply topically.     famotidine (PEPCID) 40 MG tablet Take 1 tablet (40 mg total) by mouth daily. 30 tablet 3   metolazone (ZAROXOLYN) 2.5 MG tablet Take 1 tablet (2.5 mg total) by mouth every Monday. As needed (TAKE 30 MINUTES PRIOR TO TAKING TORSEMIDE) 12 tablet 1   metoprolol tartrate (LOPRESSOR) 25 MG tablet Take 1 tablet (25 mg total) by mouth 2 (two) times daily. 180 tablet 1   ondansetron (ZOFRAN-ODT) 4 MG disintegrating tablet Take 1 tablet (4 mg total) by mouth every 8 (eight) hours as needed for nausea or vomiting. 30 tablet 1   potassium chloride SA (KLOR-CON M) 20 MEQ tablet Take 2 tablets (40 mEq total) by mouth 2 (two) times daily. Take while Taking Lasix/Furosemide 120 tablet 0   promethazine (PHENERGAN) 25 MG tablet Take 25 mg by mouth every 6 (six) hours as needed.     rosuvastatin (CRESTOR) 5 MG tablet Take 5 mg by mouth daily.     sertraline (ZOLOFT) 100 MG tablet Take 200 mg by mouth daily.     torsemide (DEMADEX) 20 MG tablet Take 80 mg by mouth 2 (two) times daily.     warfarin (COUMADIN) 2 MG tablet TAKE 2 TABLETS ONCE A DAY OR AS DIRECTED BY COUMADIN CLINIC. (Patient taking  differently: Take 2 mg by mouth See admin instructions. TAKE 2 mg on Monday,Wednesday, and Friday and 4 mg on Sunday,Tuesday, Thursday, and Saturday. AS DIRECTED BY COUMADIN CLINIC.) 65 tablet 5   loperamide (IMODIUM) 2 MG capsule Take 2 capsules (4 mg total) by mouth 3 (three) times daily as needed for diarrhea or loose stools. (Patient not taking: Reported on 03/14/2023) 30 capsule 2   No current facility-administered medications for this visit.    Allergies as of 03/14/2023   (No Known Allergies)    Family History  Problem Relation Age of Onset   Cancer Father        Lung   Dementia Maternal Grandmother    Heart disease Maternal Grandfather        had  open heart problems   Cancer Paternal Grandmother    Cancer Paternal Grandfather    Colon cancer Neg Hx     Social History   Socioeconomic History   Marital status: Married    Spouse name: Not on file   Number of children: Not on file   Years of education: Not on file   Highest education level: Not on file  Occupational History   Not on file  Tobacco Use   Smoking status: Former    Current packs/day: 0.00    Average packs/day: 0.5 packs/day for 26.0 years (13.0 ttl pk-yrs)    Types: Cigarettes    Start date: 12/18/1991    Quit date: 09/23/2017    Years since quitting: 5.4   Smokeless tobacco: Never  Vaping Use   Vaping status: Former  Substance and Sexual Activity   Alcohol use: Not Currently   Drug use: Never   Sexual activity: Yes    Birth control/protection: I.U.D.  Other Topics Concern   Not on file  Social History Narrative   Not on file   Social Determinants of Health   Financial Resource Strain: Not on file  Food Insecurity: No Food Insecurity (02/20/2023)   Hunger Vital Sign    Worried About Running Out of Food in the Last Year: Never true    Ran Out of Food in the Last Year: Never true  Transportation Needs: No Transportation Needs (02/20/2023)   PRAPARE - Administrator, Civil Service  (Medical): No    Lack of Transportation (Non-Medical): No  Physical Activity: Not on file  Stress: Not on file  Social Connections: Not on file  Intimate Partner Violence: Not At Risk (02/20/2023)   Humiliation, Afraid, Rape, and Kick questionnaire    Fear of Current or Ex-Partner: No    Emotionally Abused: No    Physically Abused: No    Sexually Abused: No    Review of Systems: Gen: Denies any fever, chills, cold or flulike symptoms, presyncope, syncope. CV: Denies chest pain, heart palpitations. Resp: Denies shortness of breath, cough. GI: See HPI GU : Denies urinary burning, urinary frequency, urinary hesitancy MS: Denies joint pain.  Derm: Denies rash. Psych: Denies depression, anxiety. Heme: See HPI  Physical Exam: BP 123/85 (BP Location: Right Arm, Patient Position: Sitting, Cuff Size: Large)   Pulse 83   Temp 97.7 F (36.5 C) (Temporal)   Ht 5\' 8"  (1.727 m)   Wt 262 lb 12.8 oz (119.2 kg)   SpO2 100%   BMI 39.96 kg/m  General:   Alert and oriented. Pleasant and cooperative. Well-nourished and well-developed.  Head:  Normocephalic and atraumatic. Eyes:  Without icterus, sclera clear and conjunctiva pink.  Ears:  Normal auditory acuity. Lungs:  Clear to auscultation bilaterally. No wheezes, rales, or rhonchi. No distress.  Heart:  S1, S2 present without murmurs appreciated.  Mechanical click present. Abdomen:  +BS, soft, non-tender and non-distended. No HSM noted. No guarding or rebound. No masses appreciated.  Rectal:  Deferred  Msk:  Symmetrical without gross deformities. Normal posture. Extremities:  Without edema. Neurologic:  Alert and  oriented x4;  grossly normal neurologically. Skin:  Intact without significant lesions or rashes. Psych:  Normal mood and affect.    Assessment:  47 year old female with history of depression, anxiety, chronic diastolic dysfunction CHF, mechanical mitral valve on warfarin, presenting today at the request of Dr. Fara Chute  for iron deficiency anemia.  IDA: Chronic history of anemia with hemoglobin in the  10 range dating back at least to 2019.  Hemoglobin declined as low as 7.3 on 8/25.  Stool was Hemoccult negative at that time.  She did receive 1 unit PRBCs with hemoglobin improved to 7.9 on 8/26.  Most recent labs 9/6 with hemoglobin improved to 8.6, but with evidence of iron deficiency, and started on oral iron at that time.  Since then, she notes dark stools.  Also reports chronic, intermittent rectal bleeding if she has constipation as she also has known hemorrhoids, but this is rare.  No prior colonoscopy.  No family history of colon cancer.   Etiology of IDA is unclear.  She could have blood loss from essentially anywhere in her GI tract in the setting of warfarin.  Rectal bleeding more likely related to benign anorectal source, but considering this, unable to rule out colon polyps or malignancy.  Additional considerations include gastritis, duodenitis, PUD, H. pylori, AVMs.  Will plan for EGD and colonoscopy for further evaluation.  GERD: Night, but increasing frequency of symptoms over the last couple of months.  She is on a PPI. Due to potential medication interactions with warfarin with PPIs we will start her on Pepcid 40 mg daily. If needed, could consider pantoprazole as this has low risk for medication interactions.   Dysphagia: Couple year history of intermittent solid food dysphagia, possibly secondary to poor dentition, but unable to rule out esophageal web, ring, stricture in the setting of chronic GERD.  As we are proceeding with EGD for IDA, will add on possible esophageal dilation as appropriate.  Constipation: Mild, intermittent constipation.  Recommended adding Benefiber daily.  Plan:  Proceed with upper endoscopy +/- dilation + colonoscopy with propofol by Dr. Marletta Lor in near future. The risks, benefits, and alternatives have been discussed with the patient in detail. The patient states  understanding and desires to proceed.  ASA 3 UPT Will discuss perioperative warfarin management with cardiology. Start Pepcid 40 mg daily. Counseled on GERD diet/lifestyle.  Separate written instructions provided on AVS. Start Benefiber 3 teaspoons daily x 2 weeks, then increase to twice daily. Follow-up after procedures.   Ermalinda Memos, PA-C [facility] 03/14/2023

## 2023-03-14 ENCOUNTER — Encounter: Payer: Self-pay | Admitting: Gastroenterology

## 2023-03-14 ENCOUNTER — Telehealth: Payer: Self-pay | Admitting: *Deleted

## 2023-03-14 ENCOUNTER — Ambulatory Visit (INDEPENDENT_AMBULATORY_CARE_PROVIDER_SITE_OTHER): Payer: Medicaid Other | Admitting: Gastroenterology

## 2023-03-14 VITALS — BP 123/85 | HR 83 | Temp 97.7°F | Ht 68.0 in | Wt 262.8 lb

## 2023-03-14 DIAGNOSIS — D509 Iron deficiency anemia, unspecified: Secondary | ICD-10-CM | POA: Diagnosis not present

## 2023-03-14 DIAGNOSIS — R1310 Dysphagia, unspecified: Secondary | ICD-10-CM | POA: Diagnosis not present

## 2023-03-14 DIAGNOSIS — K219 Gastro-esophageal reflux disease without esophagitis: Secondary | ICD-10-CM | POA: Diagnosis not present

## 2023-03-14 DIAGNOSIS — K5900 Constipation, unspecified: Secondary | ICD-10-CM

## 2023-03-14 DIAGNOSIS — K625 Hemorrhage of anus and rectum: Secondary | ICD-10-CM

## 2023-03-14 MED ORDER — FAMOTIDINE 40 MG PO TABS
40.0000 mg | ORAL_TABLET | Freq: Every day | ORAL | 3 refills | Status: DC
Start: 2023-03-14 — End: 2023-07-11

## 2023-03-14 NOTE — Patient Instructions (Addendum)
We will get you scheduled for an upper endoscopy with possible esophageal dilation and colonoscopy in the near future with Dr. Marletta Lor.  We will reach out to your cardiologist about holding warfarin and bridging therapy to the time of procedures.   Please start Pepcid 40 mg daily first thing in the morning for reflux.   Follow a GERD diet:  Avoid fried, fatty, greasy, spicy, citrus foods. Avoid caffeine and carbonated beverages. Avoid chocolate. Try eating 4-6 small meals a day rather than 3 large meals. Do not eat within 3 hours of laying down. Prop head of bed up on wood or bricks to create a 6 inch incline.  Start benefiber 3 teaspoons daily x 2 weeks then increase to twice daily to help with bowel regularity.   We will plan to see you back in the office after your procedures.    It was very nice to meet you today!   Ermalinda Memos, PA-C [facility]

## 2023-03-14 NOTE — Telephone Encounter (Signed)
Request for patient to stop medication prior to procedure or is needing cleareance  03/14/23  Gloria James 08-22-1975  What type of surgery is being performed? Colonoscopy/Esophagogastroduodenoscopy (EGD) with possible esophageal dilation  When is surgery scheduled? TBD  What type of clearance is required (medical or pharmacy to hold medication or both? medication  Are there any medications that need to be held prior to surgery and how long? Pt needs to hold Warfarin x 5 days prior to procedure and if she needs bridging.  Name of physician performing surgery?  Dr.carver [facility] at [employer]: [phone] Fax: [phone]  Anethesia type (none, local, MAC, general)? MAC

## 2023-03-14 NOTE — Telephone Encounter (Signed)
Patient Name: Gloria James  DOB: [birth year] MRN: [id number]  Primary Cardiologist: Dina Rich, MD  Chart reviewed as part of pre-operative protocol coverage. Pre-op clearance already addressed by colleagues in earlier phone notes. To summarize recommendations:  -Per office protocol, patient can hold warfarin for 5 days prior to procedure. Patient will need bridging with Lovenox around procedure. Warfarin is followed in [facility] office where bridge can be coordinated.   Medical clearance was not requested but patient was doing well from a CV standpoint back in Feb.  Will route this bundled recommendation to requesting provider via Epic fax function and remove from pre-op pool. Please call with questions.  Sharlene Dory, PA-C 03/14/2023, 1:17 PM

## 2023-03-14 NOTE — Telephone Encounter (Signed)
Patient with diagnosis of mechanical MVR on warfarin for anticoagulation.    Procedure: colonoscopy and EGD Date of procedure: TBD  CrCl 64mL/min using adjusted body weight Platelet count 235K  Per office protocol, patient can hold warfarin for 5 days prior to procedure. Patient will need bridging with Lovenox around procedure. Warfarin is followed in [facility] office where bridge can be coordinated.  **This guidance is not considered finalized until pre-operative APP has relayed final recommendations.**

## 2023-03-15 ENCOUNTER — Ambulatory Visit: Payer: Medicaid Other

## 2023-03-15 ENCOUNTER — Ambulatory Visit: Payer: Medicaid Other | Attending: Cardiology | Admitting: Cardiology
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
## 2023-03-21 ENCOUNTER — Telehealth: Payer: Self-pay | Admitting: *Deleted

## 2023-03-21 NOTE — Telephone Encounter (Signed)
Called patient.  She missed INR appt last week and needs to reschedule.  Appt made of 03/22/23 at 2:00pm.  She verbalized understanding.

## 2023-03-21 NOTE — Telephone Encounter (Signed)
New message   Please call

## 2023-03-22 ENCOUNTER — Ambulatory Visit: Payer: Medicaid Other | Attending: Cardiology | Admitting: *Deleted

## 2023-03-22 DIAGNOSIS — Z954 Presence of other heart-valve replacement: Secondary | ICD-10-CM | POA: Diagnosis not present

## 2023-03-22 DIAGNOSIS — Z5181 Encounter for therapeutic drug level monitoring: Secondary | ICD-10-CM

## 2023-03-22 LAB — POCT INR: INR: 2.1 (ref 2.0–3.0)

## 2023-03-22 NOTE — Patient Instructions (Signed)
Increase warfarin to 2 tablets daily except 1 tablet on Mondays, Wednesdays and Fridays Continue greens Recheck in 2 weeks

## 2023-03-23 NOTE — Telephone Encounter (Signed)
Please advise. Thank you

## 2023-03-23 NOTE — Telephone Encounter (Signed)
OK to proceed with scheduling.  Molli Knock has been given to hold warfarin for 5 days prior to procedure, but she will need bridging.  Bridging will need to be coordinated with Martyn Malay, RN.

## 2023-03-24 NOTE — Telephone Encounter (Signed)
Will schedule once we get providers schedule for nov

## 2023-03-28 ENCOUNTER — Other Ambulatory Visit: Payer: Self-pay | Admitting: *Deleted

## 2023-03-28 ENCOUNTER — Encounter: Payer: Self-pay | Admitting: *Deleted

## 2023-03-28 ENCOUNTER — Telehealth: Payer: Self-pay | Admitting: Cardiology

## 2023-03-28 MED ORDER — PEG 3350-KCL-NA BICARB-NACL 420 G PO SOLR: 4000.0000 mL | Freq: Once | ORAL | 0 refills | Status: AC

## 2023-03-28 NOTE — Telephone Encounter (Signed)
Called to let me know colonoscopy/endoscopy has been scheduled for 05/16/23.  She has appt with me for INR check on 10/8.  We will talk about holding warfarin and bridging with Lovenox at that time.  She verbalized understanding.

## 2023-03-28 NOTE — Telephone Encounter (Signed)
Patient is requesting to speak with RN Vashti Hey. Please advise.

## 2023-03-28 NOTE — Telephone Encounter (Signed)
Pt has been scheduled for 05/16/23. Instructions sent via MyChart and prep sent to the pharmacy

## 2023-04-05 ENCOUNTER — Ambulatory Visit: Payer: Medicaid Other | Attending: Cardiology | Admitting: *Deleted

## 2023-04-05 DIAGNOSIS — Z5181 Encounter for therapeutic drug level monitoring: Secondary | ICD-10-CM | POA: Diagnosis not present

## 2023-04-05 DIAGNOSIS — Z954 Presence of other heart-valve replacement: Secondary | ICD-10-CM | POA: Diagnosis not present

## 2023-04-05 LAB — POCT INR: INR: 2.7 (ref 2.0–3.0)

## 2023-04-05 NOTE — Patient Instructions (Signed)
Continue warfarin 2 tablets daily except 1 tablet on Mondays, Wednesdays and Fridays Continue greens Recheck in 4 weeks Pending colonoscopy 05/16/23. Will hold warfarin 5 days before procedure and bridge with Lovenox

## 2023-04-27 ENCOUNTER — Telehealth: Payer: Self-pay | Admitting: Cardiology

## 2023-04-27 NOTE — Telephone Encounter (Signed)
Patient is asking that you to give her a call when you get the chance. Please advise
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
## 2023-04-28 ENCOUNTER — Ambulatory Visit: Payer: Medicaid Other | Attending: Cardiology | Admitting: *Deleted

## 2023-04-28 DIAGNOSIS — Z954 Presence of other heart-valve replacement: Secondary | ICD-10-CM

## 2023-04-28 DIAGNOSIS — Z5181 Encounter for therapeutic drug level monitoring: Secondary | ICD-10-CM

## 2023-04-28 LAB — POCT INR: INR: 2 (ref 2.0–3.0)

## 2023-04-28 MED ORDER — ENOXAPARIN SODIUM 120 MG/0.8ML IJ SOSY: 120.0000 mg | PREFILLED_SYRINGE | Freq: Two times a day (BID) | INTRAMUSCULAR | 0 refills | Status: DC

## 2023-04-28 NOTE — Patient Instructions (Signed)
Take warfarin 3 tablets tonight, 2 tablets tomorrow then resume 2 tablets daily except 1 tablet on Mondays, Wednesdays and Fridays Continue Lovenox 120mg  twice daily Recheck INR on Monday 05/02/23 Postponing colonoscopy 05/16/23. Will need to hold warfarin 5 days before procedure and bridge with Lovenox if she reschedules.

## 2023-05-02 ENCOUNTER — Ambulatory Visit: Payer: Medicaid Other | Attending: Cardiology | Admitting: *Deleted

## 2023-05-02 DIAGNOSIS — Z5181 Encounter for therapeutic drug level monitoring: Secondary | ICD-10-CM | POA: Diagnosis not present

## 2023-05-02 DIAGNOSIS — Z954 Presence of other heart-valve replacement: Secondary | ICD-10-CM | POA: Diagnosis not present

## 2023-05-02 LAB — POCT INR: INR: 2.9 (ref 2.0–3.0)

## 2023-05-02 NOTE — Patient Instructions (Signed)
Continue 2 tablets daily except 1 tablet on Mondays, Wednesdays and Fridays Stop Lovenox Recheck INR in 2 wks Postponing colonoscopy 05/16/23. Will need to hold warfarin 5 days before procedure and bridge with Lovenox if she reschedules.

## 2023-05-10 NOTE — Patient Instructions (Signed)
Gloria James  05/10/2023     @PREFPERIOPPHARMACY @   Your procedure is scheduled on  05/16/2023.   Report to [facility] at  0600  A.M.   Call this number if you have problems the morning of surgery:  [phone]  If you experience any cold or flu symptoms such as cough, fever, chills, shortness of breath, etc. between now and your scheduled surgery, please notify us at the above number.   Remember:      Use your inhaler before you come and bring your rescue inhaler with you.    Follow the diet and prep instructions given to you by the office.    You may drink clear liquids until 0600 am on 05/16/2023.    Clear liquids allowed are:                    Water, Juice (No red color; non-citric and without pulp; diabetics please choose diet or no sugar options), Carbonated beverages (diabetics please choose diet or no sugar options), Clear Tea (No creamer, milk, or cream, including half & half and powdered creamer), Black Coffee Only (No creamer, milk or cream, including half & half and powdered creamer), and Clear Sports drink (No red color; diabetics please choose diet or no sugar options)    Take these medicines the morning of surgery with A SIP OF WATER         alprazolam(if needed), famotidine, metoprolol, ondansteron(if needed), sertraline.     Do not wear jewelry, make-up or nail polish, including gel polish,  artificial nails, or any other type of covering on natural nails (fingers and  toes).  Do not wear lotions, powders, or perfumes, or deodorant.  Do not shave 48 hours prior to surgery.  Men may shave face and neck.  Do not bring valuables to the hospital.  [facility] is not responsible for any belongings or valuables.  Contacts, dentures or bridgework may not be worn into surgery.  Leave your suitcase in the car.  After surgery it may be brought to your room.  For patients admitted to the hospital, discharge time will be determined by your treatment  team.  Patients discharged the day of surgery will not be allowed to drive home and must have someone with you for 24 hours.    Special instructions:   DO NOT smoke tobacco or vape for 24 hours before your procedure.  Please read over the following fact sheets that you were given. Anesthesia Post-op Instructions and Care and Recovery After Surgery       Upper Endoscopy, Adult, Care After After the procedure, it is common to have a sore throat. It is also common to have: Mild stomach pain or discomfort. Bloating. Nausea. Follow these instructions at home: The instructions below may help you care for yourself at home. Your health care provider may give you more instructions. If you have questions, ask your health care provider. If you were given a sedative during the procedure, it can affect you for several hours. Do not drive or operate machinery until your health care provider says that it is safe. If you will be going home right after the procedure, plan to have a responsible adult: Take you home from the hospital or clinic. You will not be allowed to drive. Care for you for the time you are told. Follow instructions from your health care provider about what you may eat and drink. Return to  your normal activities as told by your health care provider. Ask your health care provider what activities are safe for you. Take over-the-counter and prescription medicines only as told by your health care provider. Contact a health care provider if you: Have a sore throat that lasts longer than one day. Have trouble swallowing. Have a fever. Get help right away if you: Vomit blood or your vomit looks like coffee grounds. Have bloody, black, or tarry stools. Have a very bad sore throat or you cannot swallow. Have difficulty breathing or very bad pain in your chest or abdomen. These symptoms may be an emergency. Get help right away. Call 911. Do not wait to see if the symptoms will go  away. Do not drive yourself to the hospital. Summary After the procedure, it is common to have a sore throat, mild stomach discomfort, bloating, and nausea. If you were given a sedative during the procedure, it can affect you for several hours. Do not drive until your health care provider says that it is safe. Follow instructions from your health care provider about what you may eat and drink. Return to your normal activities as told by your health care provider. This information is not intended to replace advice given to you by your health care provider. Make sure you discuss any questions you have with your health care provider. Document Revised: 09/23/2021 Document Reviewed: 09/23/2021 Elsevier Patient Education  2024 [employer]   Esophageal Dilatation Esophageal dilatation, also called esophageal dilation, is a procedure to widen or open a blocked or narrowed part of the esophagus. The esophagus is the part of the body that moves food and liquid from the mouth to the stomach. You may need this procedure if: You have a buildup of scar tissue in your esophagus that makes it difficult, painful, or impossible to swallow. This can be caused by gastroesophageal reflux disease (GERD). You have cancer of the esophagus. There is a problem with how food moves through your esophagus. In some cases, you may need this procedure repeated at a later time to dilate the esophagus gradually. Tell a health care provider about: Any allergies you have. All medicines you are taking, including vitamins, herbs, eye drops, creams, and over-the-counter medicines. Any problems you or family members have had with anesthetic medicines. Any blood disorders you have. Any surgeries you have had. Any medical conditions you have. Any antibiotic medicines you are required to take before dental procedures. Whether you are pregnant or may be pregnant. What are the risks? Generally, this is a safe procedure. However,  problems may occur, including: Bleeding due to a tear in the lining of the esophagus. A hole, or perforation, in the esophagus. What happens before the procedure? Ask your health care provider about: Changing or stopping your regular medicines. This is especially important if you are taking diabetes medicines or blood thinners. Taking medicines such as aspirin and ibuprofen. These medicines can thin your blood. Do not take these medicines unless your health care provider tells you to take them. Taking over-the-counter medicines, vitamins, herbs, and supplements. Follow instructions from your health care provider about eating or drinking restrictions. Plan to have a responsible adult take you home from the hospital or clinic. Plan to have a responsible adult care for you for the time you are told after you leave the hospital or clinic. This is important. What happens during the procedure? You may be given a medicine to help you relax (sedative). A numbing medicine may be sprayed  into the back of your throat, or you may gargle the medicine. Your health care provider may perform the dilatation using various surgical instruments, such as: Simple dilators. This instrument is carefully placed in the esophagus to stretch it. Guided wire bougies. This involves using an endoscope to insert a wire into the esophagus. A dilator is passed over this wire to enlarge the esophagus. Then the wire is removed. Balloon dilators. An endoscope with a small balloon is inserted into the esophagus. The balloon is inflated to stretch the esophagus and open it up. The procedure may vary among health care providers and hospitals. What can I expect after the procedure? Your blood pressure, heart rate, breathing rate, and blood oxygen level will be monitored until you leave the hospital or clinic. Your throat may feel slightly sore and numb. This will get better over time. You will not be allowed to eat or drink until your  throat is no longer numb. When you are able to drink, urinate, and sit on the edge of the bed without nausea or dizziness, you may be able to return home. Follow these instructions at home: Take over-the-counter and prescription medicines only as told by your health care provider. If you were given a sedative during the procedure, it can affect you for several hours. Do not drive or operate machinery until your health care provider says that it is safe. Plan to have a responsible adult care for you for the time you are told. This is important. Follow instructions from your health care provider about any eating or drinking restrictions. Do not use any products that contain nicotine or tobacco, such as cigarettes, e-cigarettes, and chewing tobacco. If you need help quitting, ask your health care provider. Keep all follow-up visits. This is important. Contact a health care provider if: You have a fever. You have pain that is not relieved by medicine. Get help right away if: You have chest pain. You have trouble breathing. You have trouble swallowing. You vomit blood. You have black, tarry, or bloody stools. These symptoms may represent a serious problem that is an emergency. Do not wait to see if the symptoms will go away. Get medical help right away. Call your local emergency services (911 in the [place] Do not drive yourself to the hospital. Summary Esophageal dilatation, also called esophageal dilation, is a procedure to widen or open a blocked or narrowed part of the esophagus. Plan to have a responsible adult take you home from the hospital or clinic. For this procedure, a numbing medicine may be sprayed into the back of your throat, or you may gargle the medicine. Do not drive or operate machinery until your health care provider says that it is safe. This information is not intended to replace advice given to you by your health care provider. Make sure you discuss any questions you have  with your health care provider. Document Revised: 10/31/2019 Document Reviewed: 10/31/2019 Elsevier Patient Education  2024 [employer] Colonoscopy, Adult, Care After The following information offers guidance on how to care for yourself after your procedure. Your health care provider may also give you more specific instructions. If you have problems or questions, contact your health care provider. What can I expect after the procedure? After the procedure, it is common to have: A small amount of blood in your stool for 24 hours after the procedure. Some gas. Mild cramping or bloating of your abdomen. Follow these instructions at home: Eating and drinking  Drink enough fluid  to keep your urine pale yellow. Follow instructions from your health care provider about eating or drinking restrictions. Resume your normal diet as told by your health care provider. Avoid heavy or fried foods that are hard to digest. Activity Rest as told by your health care provider. Avoid sitting for a long time without moving. Get up to take short walks every 1-2 hours. This is important to improve blood flow and breathing. Ask for help if you feel weak or unsteady. Return to your normal activities as told by your health care provider. Ask your health care provider what activities are safe for you. Managing cramping and bloating  Try walking around when you have cramps or feel bloated. If directed, apply heat to your abdomen as told by your health care provider. Use the heat source that your health care provider recommends, such as a moist heat pack or a heating pad. Place a towel between your skin and the heat source. Leave the heat on for 20-30 minutes. Remove the heat if your skin turns bright red. This is especially important if you are unable to feel pain, heat, or cold. You have a greater risk of getting burned. General instructions If you were given a sedative during the procedure, it can affect you for  several hours. Do not drive or operate machinery until your health care provider says that it is safe. For the first 24 hours after the procedure: Do not sign important documents. Do not drink alcohol. Do your regular daily activities at a slower pace than normal. Eat soft foods that are easy to digest. Take over-the-counter and prescription medicines only as told by your health care provider. Keep all follow-up visits. This is important. Contact a health care provider if: You have blood in your stool 2-3 days after the procedure. Get help right away if: You have more than a small spotting of blood in your stool. You have large blood clots in your stool. You have swelling of your abdomen. You have nausea or vomiting. You have a fever. You have increasing pain in your abdomen that is not relieved with medicine. These symptoms may be an emergency. Get help right away. Call 911. Do not wait to see if the symptoms will go away. Do not drive yourself to the hospital. Summary After the procedure, it is common to have a small amount of blood in your stool. You may also have mild cramping and bloating of your abdomen. If you were given a sedative during the procedure, it can affect you for several hours. Do not drive or operate machinery until your health care provider says that it is safe. Get help right away if you have a lot of blood in your stool, nausea or vomiting, a fever, or increased pain in your abdomen. This information is not intended to replace advice given to you by your health care provider. Make sure you discuss any questions you have with your health care provider. Document Revised: 07/27/2022 Document Reviewed: 02/04/2021 Elsevier Patient Education  2024 [employer] Monitored Anesthesia Care, Care After The following information offers guidance on how to care for yourself after your procedure. Your health care provider may also give you more specific instructions. If you have  problems or questions, contact your health care provider. What can I expect after the procedure? After the procedure, it is common to have: Tiredness. Little or no memory about what happened during or after the procedure. Impaired judgment when it comes to making decisions. Nausea  or vomiting. Some trouble with balance. Follow these instructions at home: For the time period you were told by your health care provider:  Rest. Do not participate in activities where you could fall or become injured. Do not drive or use machinery. Do not drink alcohol. Do not take sleeping pills or medicines that cause drowsiness. Do not make important decisions or sign legal documents. Do not take care of children on your own. Medicines Take over-the-counter and prescription medicines only as told by your health care provider. If you were prescribed antibiotics, take them as told by your health care provider. Do not stop using the antibiotic even if you start to feel better. Eating and drinking Follow instructions from your health care provider about what you may eat and drink. Drink enough fluid to keep your urine pale yellow. If you vomit: Drink clear fluids slowly and in small amounts as you are able. Clear fluids include water, ice chips, low-calorie sports drinks, and fruit juice that has water added to it (diluted fruit juice). Eat light and bland foods in small amounts as you are able. These foods include bananas, applesauce, rice, lean meats, toast, and crackers. General instructions  Have a responsible adult stay with you for the time you are told. It is important to have someone help care for you until you are awake and alert. If you have sleep apnea, surgery and some medicines can increase your risk for breathing problems. Follow instructions from your health care provider about wearing your sleep device: When you are sleeping. This includes during daytime naps. While taking prescription pain  medicines, sleeping medicines, or medicines that make you drowsy. Do not use any products that contain nicotine or tobacco. These products include cigarettes, chewing tobacco, and vaping devices, such as e-cigarettes. If you need help quitting, ask your health care provider. Contact a health care provider if: You feel nauseous or vomit every time you eat or drink. You feel light-headed. You are still sleepy or having trouble with balance after 24 hours. You get a rash. You have a fever. You have redness or swelling around the IV site. Get help right away if: You have trouble breathing. You have new confusion after you get home. These symptoms may be an emergency. Get help right away. Call 911. Do not wait to see if the symptoms will go away. Do not drive yourself to the hospital. This information is not intended to replace advice given to you by your health care provider. Make sure you discuss any questions you have with your health care provider. Document Revised: 11/09/2021 Document Reviewed: 11/09/2021 Elsevier Patient Education  2024 [employer].

## 2023-05-11 ENCOUNTER — Telehealth: Payer: Self-pay | Admitting: *Deleted
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
## 2023-05-11 NOTE — Telephone Encounter (Signed)
Noted  

## 2023-05-12 ENCOUNTER — Encounter (HOSPITAL_COMMUNITY)
Admission: RE | Admit: 2023-05-12 | Discharge: 2023-05-12 | Disposition: A | Discharge status: Home / Self Care | Payer: Medicaid Other | Source: Ambulatory Visit | Attending: Internal Medicine | Admitting: Internal Medicine

## 2023-05-12 ENCOUNTER — Encounter (HOSPITAL_COMMUNITY): Payer: Self-pay

## 2023-05-12 DIAGNOSIS — Z01818 Encounter for other preprocedural examination: Secondary | ICD-10-CM

## 2023-05-16 ENCOUNTER — Ambulatory Visit: Payer: Medicaid Other | Attending: Cardiology | Admitting: *Deleted

## 2023-05-16 ENCOUNTER — Ambulatory Visit (HOSPITAL_COMMUNITY): Admission: RE | Admit: 2023-05-16 | Payer: Medicaid Other | Source: Home / Self Care

## 2023-05-16 ENCOUNTER — Encounter (HOSPITAL_COMMUNITY): Admission: RE | Payer: Self-pay | Source: Home / Self Care

## 2023-05-16 DIAGNOSIS — Z5181 Encounter for therapeutic drug level monitoring: Secondary | ICD-10-CM

## 2023-05-16 DIAGNOSIS — Z954 Presence of other heart-valve replacement: Secondary | ICD-10-CM

## 2023-05-16 LAB — POCT INR: INR: 2.7 (ref 2.0–3.0)

## 2023-05-16 SURGERY — COLONOSCOPY WITH PROPOFOL
Anesthesia: Monitor Anesthesia Care

## 2023-05-16 NOTE — Patient Instructions (Signed)
Continue 2 tablets daily except 1 tablet on Mondays, Wednesdays and Fridays Recheck INR in 4 wks Postponing colonoscopy 05/16/23. Will need to hold warfarin 5 days before procedure and bridge with Lovenox if she reschedules.

## 2023-06-09 ENCOUNTER — Ambulatory Visit: Payer: Medicaid Other | Attending: Cardiology | Admitting: Cardiology

## 2023-06-09 VITALS — BP 122/84 | HR 76 | Ht 68.0 in | Wt 248.6 lb

## 2023-06-09 DIAGNOSIS — Z79899 Other long term (current) drug therapy: Secondary | ICD-10-CM

## 2023-06-09 DIAGNOSIS — Z952 Presence of prosthetic heart valve: Secondary | ICD-10-CM

## 2023-06-09 DIAGNOSIS — I4891 Unspecified atrial fibrillation: Secondary | ICD-10-CM | POA: Diagnosis not present

## 2023-06-09 DIAGNOSIS — I5032 Chronic diastolic (congestive) heart failure: Secondary | ICD-10-CM | POA: Diagnosis not present
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
## 2023-06-09 NOTE — Patient Instructions (Signed)
Medication Instructions:   Continue all current medications.   Labwork:  BMET, Mg - orders given today Please do in 2 weeks Office will contact with results via phone, letter or mychart.    Testing/Procedures:  none  Follow-Up:  6 months   Any Other Special Instructions Will Be Listed Below (If Applicable).   If you need a refill on your cardiac medications before your next appointment, please call your pharmacy.

## 2023-06-13 ENCOUNTER — Ambulatory Visit: Payer: Medicaid Other | Attending: Cardiology | Admitting: *Deleted

## 2023-06-13 DIAGNOSIS — Z5181 Encounter for therapeutic drug level monitoring: Secondary | ICD-10-CM | POA: Diagnosis not present

## 2023-06-13 DIAGNOSIS — Z954 Presence of other heart-valve replacement: Secondary | ICD-10-CM | POA: Diagnosis not present

## 2023-06-13 LAB — POCT INR: INR: 4.5 — AB (ref 2.0–3.0)

## 2023-06-13 NOTE — Patient Instructions (Signed)
Hold warfarin tonight then decrease dose to 1 tablet daily except 2 tablets on Sundays and Wednesdays Recheck INR in 3 wks

## 2023-07-05 ENCOUNTER — Ambulatory Visit: Payer: Medicaid Other | Attending: Cardiology | Admitting: *Deleted

## 2023-07-05 DIAGNOSIS — Z954 Presence of other heart-valve replacement: Secondary | ICD-10-CM

## 2023-07-05 DIAGNOSIS — Z5181 Encounter for therapeutic drug level monitoring: Secondary | ICD-10-CM

## 2023-07-05 LAB — POCT INR: INR: 2.6 (ref 2.0–3.0)

## 2023-07-05 NOTE — Patient Instructions (Signed)
 Continue warfarin 1 tablet daily except 2 tablets on Sundays and Wednesdays Recheck INR in 3 wks

## 2023-07-07 ENCOUNTER — Encounter: Payer: Self-pay | Admitting: Cardiology

## 2023-07-10 ENCOUNTER — Other Ambulatory Visit: Payer: Self-pay | Admitting: Cardiology

## 2023-07-10 ENCOUNTER — Other Ambulatory Visit: Payer: Self-pay | Admitting: Gastroenterology

## 2023-07-10 DIAGNOSIS — Z5181 Encounter for therapeutic drug level monitoring: Secondary | ICD-10-CM

## 2023-07-10 DIAGNOSIS — K219 Gastro-esophageal reflux disease without esophagitis: Secondary | ICD-10-CM

## 2023-07-11 NOTE — Telephone Encounter (Signed)
 Refill request for warfarin:  Last INR was 2.6 on 07/05/23 Next INR due 07/26/23 LOV was 06/09/23  Refill approved.

## 2023-07-14 NOTE — Telephone Encounter (Signed)
Would not be anything of concern, just slightly above normal cut off, that is an effect of the fluid pills. With such a mild change would not be of concern  Dominga Ferry MD

## 2023-07-26 ENCOUNTER — Ambulatory Visit: Payer: Medicaid Other | Attending: Cardiology | Admitting: *Deleted

## 2023-07-26 DIAGNOSIS — Z5181 Encounter for therapeutic drug level monitoring: Secondary | ICD-10-CM

## 2023-07-26 DIAGNOSIS — Z954 Presence of other heart-valve replacement: Secondary | ICD-10-CM

## 2023-07-26 LAB — POCT INR: INR: 1.8 — AB (ref 2.0–3.0)

## 2023-07-26 NOTE — Patient Instructions (Signed)
Take warfarin 2 tablets tonight then increase dose to 2 tablets daily except 1 tablet on Tuesdays, Thursdays and Saturdays Recheck INR in 1 wks

## 2023-08-03 ENCOUNTER — Ambulatory Visit: Payer: Medicaid Other | Attending: Cardiology | Admitting: *Deleted

## 2023-08-03 DIAGNOSIS — Z954 Presence of other heart-valve replacement: Secondary | ICD-10-CM

## 2023-08-03 DIAGNOSIS — Z5181 Encounter for therapeutic drug level monitoring: Secondary | ICD-10-CM

## 2023-08-03 LAB — POCT INR: POC INR: 2.8

## 2023-08-03 NOTE — Patient Instructions (Signed)
 Description   Continue taking  2 tablets daily except 1 tablet on Tuesdays, Thursdays and Saturdays Recheck INR in 2 wks

## 2023-08-17 ENCOUNTER — Ambulatory Visit: Payer: Medicaid Other | Attending: Cardiology | Admitting: *Deleted

## 2023-08-17 DIAGNOSIS — Z5181 Encounter for therapeutic drug level monitoring: Secondary | ICD-10-CM | POA: Diagnosis not present

## 2023-08-17 DIAGNOSIS — Z954 Presence of other heart-valve replacement: Secondary | ICD-10-CM

## 2023-08-17 LAB — POCT INR: INR: 2.5 (ref 2.0–3.0)

## 2023-08-17 NOTE — Patient Instructions (Signed)
 Continue taking  2 tablets daily except 1 tablet on Tuesdays, Thursdays and Saturdays Recheck INR in 3 wks

## 2023-09-07 ENCOUNTER — Ambulatory Visit: Payer: Medicaid Other | Attending: Cardiology | Admitting: *Deleted

## 2023-09-07 DIAGNOSIS — Z954 Presence of other heart-valve replacement: Secondary | ICD-10-CM

## 2023-09-07 DIAGNOSIS — Z5181 Encounter for therapeutic drug level monitoring: Secondary | ICD-10-CM | POA: Diagnosis not present

## 2023-09-07 LAB — POCT INR: INR: 3.5 — AB (ref 2.0–3.0)

## 2023-09-07 NOTE — Patient Instructions (Signed)
 Continue taking  2 tablets daily except 1 tablet on Tuesdays, Thursdays and Saturdays Recheck INR in 4 wks

## 2023-09-20 LAB — LAB REPORT - SCANNED
A1c: 5.7
EGFR: 73.5

## 2023-10-05 ENCOUNTER — Ambulatory Visit: Attending: Cardiology | Admitting: *Deleted

## 2023-10-05 DIAGNOSIS — Z954 Presence of other heart-valve replacement: Secondary | ICD-10-CM

## 2023-10-05 DIAGNOSIS — Z5181 Encounter for therapeutic drug level monitoring: Secondary | ICD-10-CM | POA: Diagnosis not present

## 2023-10-05 LAB — POCT INR: INR: 3.5 — AB (ref 2.0–3.0)

## 2023-10-05 NOTE — Patient Instructions (Signed)
 Continue taking 2 tablets daily except 1 tablet on Tuesdays, Thursdays and Saturdays Recheck INR in 6 wks

## 2023-11-02 ENCOUNTER — Encounter: Payer: Self-pay | Admitting: Cardiology

## 2023-11-02 ENCOUNTER — Ambulatory Visit: Attending: Cardiology | Admitting: Cardiology

## 2023-11-02 VITALS — BP 130/70 | HR 73 | Ht 68.0 in | Wt 236.2 lb

## 2023-11-02 DIAGNOSIS — Z952 Presence of prosthetic heart valve: Secondary | ICD-10-CM | POA: Diagnosis present

## 2023-11-02 DIAGNOSIS — I4891 Unspecified atrial fibrillation: Secondary | ICD-10-CM | POA: Diagnosis present

## 2023-11-02 DIAGNOSIS — I5032 Chronic diastolic (congestive) heart failure: Secondary | ICD-10-CM | POA: Insufficient documentation
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
## 2023-11-02 NOTE — Patient Instructions (Signed)

## 2023-11-16 ENCOUNTER — Ambulatory Visit: Attending: Cardiology | Admitting: *Deleted

## 2023-11-16 DIAGNOSIS — Z954 Presence of other heart-valve replacement: Secondary | ICD-10-CM

## 2023-11-16 DIAGNOSIS — Z5181 Encounter for therapeutic drug level monitoring: Secondary | ICD-10-CM | POA: Diagnosis present

## 2023-11-16 LAB — POCT INR: INR: 2.9 (ref 2.0–3.0)

## 2023-11-16 NOTE — Patient Instructions (Signed)
 Continue taking 2 tablets daily except 1 tablet on Tuesdays, Thursdays and Saturdays Recheck INR in 6 wks

## 2023-12-12 ENCOUNTER — Ambulatory Visit: Payer: Medicaid Other | Admitting: Cardiology

## 2023-12-27 ENCOUNTER — Other Ambulatory Visit: Payer: Self-pay | Admitting: Gastroenterology

## 2023-12-27 DIAGNOSIS — K219 Gastro-esophageal reflux disease without esophagitis: Secondary | ICD-10-CM

## 2023-12-27 NOTE — Telephone Encounter (Signed)
Needs routine follow up

## 2023-12-28 ENCOUNTER — Ambulatory Visit: Attending: Cardiology | Admitting: *Deleted

## 2023-12-28 DIAGNOSIS — Z5181 Encounter for therapeutic drug level monitoring: Secondary | ICD-10-CM | POA: Diagnosis present

## 2023-12-28 DIAGNOSIS — Z954 Presence of other heart-valve replacement: Secondary | ICD-10-CM | POA: Insufficient documentation

## 2023-12-28 LAB — POCT INR: INR: 3 (ref 2.0–3.0)

## 2023-12-28 NOTE — Progress Notes (Signed)
Please see anticoagulation encounter.

## 2023-12-28 NOTE — Patient Instructions (Signed)
 Continue taking 2 tablets daily except 1 tablet on Tuesdays, Thursdays and Saturdays Recheck INR in 6 wks

## 2023-12-29 ENCOUNTER — Telehealth: Payer: Self-pay | Admitting: Gastroenterology

## 2023-12-29 NOTE — Telephone Encounter (Signed)
 Patient is due for a follow up appt---called patient no answer and left voicemail

## 2024-01-18 NOTE — Telephone Encounter (Signed)
 Noted

## 2024-02-08 ENCOUNTER — Ambulatory Visit: Attending: Cardiology | Admitting: *Deleted

## 2024-02-08 DIAGNOSIS — Z954 Presence of other heart-valve replacement: Secondary | ICD-10-CM | POA: Diagnosis present

## 2024-02-08 DIAGNOSIS — Z5181 Encounter for therapeutic drug level monitoring: Secondary | ICD-10-CM | POA: Diagnosis present

## 2024-02-08 LAB — POCT INR: INR: 2.9 (ref 2.0–3.0)

## 2024-02-08 NOTE — Progress Notes (Signed)
 INR 2.9; Please see anticoagulation encounter

## 2024-02-08 NOTE — Patient Instructions (Signed)
 Continue taking 2 tablets daily except 1 tablet on Tuesdays, Thursdays and Saturdays Recheck INR in 6 wks

## 2024-02-29 ENCOUNTER — Other Ambulatory Visit: Payer: Self-pay | Admitting: Cardiology

## 2024-03-21 ENCOUNTER — Ambulatory Visit: Attending: Cardiology | Admitting: *Deleted

## 2024-03-21 DIAGNOSIS — Z5181 Encounter for therapeutic drug level monitoring: Secondary | ICD-10-CM | POA: Diagnosis present

## 2024-03-21 DIAGNOSIS — Z954 Presence of other heart-valve replacement: Secondary | ICD-10-CM | POA: Insufficient documentation

## 2024-03-21 LAB — POCT INR: INR: 2.2 (ref 2.0–3.0)

## 2024-03-21 NOTE — Progress Notes (Signed)
 INR 2.2; Please see anticoagulation encounter

## 2024-03-21 NOTE — Patient Instructions (Signed)
 Take warfarin 3 tablets tonight then resume 2 tablets daily except 1 tablet on Tuesdays, Thursdays and Saturdays Recheck INR in 6 wks

## 2024-04-11 ENCOUNTER — Other Ambulatory Visit: Payer: Self-pay | Admitting: Cardiology

## 2024-04-11 DIAGNOSIS — Z5181 Encounter for therapeutic drug level monitoring: Secondary | ICD-10-CM

## 2024-04-11 NOTE — Telephone Encounter (Signed)
 Refill request for warfarin:  Last INR was 2.2 on 03/21/24 Next INR due 05/02/24 LOV was 11/02/23  Refill approved.

## 2024-04-21 ENCOUNTER — Other Ambulatory Visit: Payer: Self-pay | Admitting: Gastroenterology

## 2024-04-21 DIAGNOSIS — K219 Gastro-esophageal reflux disease without esophagitis: Secondary | ICD-10-CM

## 2024-05-02 ENCOUNTER — Ambulatory Visit: Attending: Cardiology | Admitting: *Deleted

## 2024-05-02 DIAGNOSIS — Z954 Presence of other heart-valve replacement: Secondary | ICD-10-CM | POA: Insufficient documentation

## 2024-05-02 DIAGNOSIS — Z5181 Encounter for therapeutic drug level monitoring: Secondary | ICD-10-CM | POA: Insufficient documentation

## 2024-05-02 LAB — POCT INR: INR: 3.2 — AB (ref 2.0–3.0)

## 2024-05-02 NOTE — Patient Instructions (Signed)
 Continue warfarin 2 tablets daily except 1 tablet on Tuesdays, Thursdays and Saturdays Recheck INR in 6 wks

## 2024-05-02 NOTE — Progress Notes (Signed)
 INR 3.2 Please see anticoagulation encounter

## 2024-05-31 ENCOUNTER — Other Ambulatory Visit: Payer: Self-pay | Admitting: Gastroenterology

## 2024-05-31 DIAGNOSIS — K219 Gastro-esophageal reflux disease without esophagitis: Secondary | ICD-10-CM

## 2024-06-13 ENCOUNTER — Ambulatory Visit: Attending: Cardiology

## 2024-06-13 DIAGNOSIS — Z5181 Encounter for therapeutic drug level monitoring: Secondary | ICD-10-CM | POA: Diagnosis present

## 2024-06-13 DIAGNOSIS — Z954 Presence of other heart-valve replacement: Secondary | ICD-10-CM | POA: Diagnosis present

## 2024-06-13 LAB — POCT INR: INR: 2.8 (ref 2.0–3.0)

## 2024-06-13 NOTE — Patient Instructions (Signed)
 Continue warfarin 2 tablets daily except 1 tablet on Tuesdays, Thursdays and Saturdays Recheck INR in 6 wks

## 2024-06-13 NOTE — Progress Notes (Signed)
 INR-2.8 Please see anticoagulation encounter
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
## 2024-06-25 ENCOUNTER — Ambulatory Visit: Admitting: Gastroenterology

## 2024-06-25 ENCOUNTER — Encounter: Payer: Self-pay | Admitting: *Deleted

## 2024-06-25 ENCOUNTER — Telehealth: Payer: Self-pay | Admitting: *Deleted

## 2024-06-25 ENCOUNTER — Encounter: Payer: Self-pay | Admitting: Gastroenterology

## 2024-06-25 VITALS — BP 115/74 | HR 67 | Temp 97.6°F | Ht 68.0 in | Wt 249.4 lb

## 2024-06-25 DIAGNOSIS — Z1211 Encounter for screening for malignant neoplasm of colon: Secondary | ICD-10-CM

## 2024-06-25 DIAGNOSIS — D509 Iron deficiency anemia, unspecified: Secondary | ICD-10-CM | POA: Diagnosis not present

## 2024-06-25 DIAGNOSIS — R10A1 Flank pain, right side: Secondary | ICD-10-CM

## 2024-06-25 DIAGNOSIS — R1011 Right upper quadrant pain: Secondary | ICD-10-CM | POA: Diagnosis not present

## 2024-06-25 DIAGNOSIS — K219 Gastro-esophageal reflux disease without esophagitis: Secondary | ICD-10-CM | POA: Diagnosis not present

## 2024-06-25 DIAGNOSIS — K580 Irritable bowel syndrome with diarrhea: Secondary | ICD-10-CM | POA: Diagnosis not present

## 2024-06-25 NOTE — Telephone Encounter (Signed)
 Patient with diagnosis of MVR on warfarin for anticoagulation.    Procedure: colonoscopy Date of procedure: TBD  CrCl 119 ml/min Platelet count Not available  Patient has not had an Afib/aflutter ablation in the last 3 months, DCCV within the last 4 weeks or a watchman implanted in the last 45 days   Per office protocol, patient can hold warfarin for 5 days prior to procedure.    Patient WILL need bridging with Lovenox  (enoxaparin ) around procedure.  **This guidance is not considered finalized until pre-operative APP has relayed final recommendations.**

## 2024-06-25 NOTE — Patient Instructions (Addendum)
 We will get you scheduled for an ultrasound of your right upper abdomen to further evaluate for gallbladder disease given some right upper quadrant pain and back pain and looser stools after meals.  I am recommending gallbladder eating plan for now and see if this helps improve your stool consistency and see if it helps decrease incidence of pain.  Your symptoms do not seem frequent enough to warrant surgery at this time if gallstones are present however it would be something to be mindful of.  Will also get you scheduled for colonoscopy after we get cardiac clearance to hold her warfarin for 5 days or their assistance and bridging with Lovenox .  Continue taking famotidine  40 mg daily.  Follow a GERD diet:  Avoid fried, fatty, greasy, spicy, citrus foods. Avoid caffeine and carbonated beverages. Avoid chocolate. Try eating 4-6 small meals a day rather than 3 large meals. Do not eat within 3 hours of laying down. Prop head of bed up on wood or bricks to create a 6 inch incline.  Please have blood work completed at [facility].  We will call you with results once they have been received. Please allow 3-5 business days for review. Locations for [facility] in [place]:              [address], [place]               We will have you follow-up in the office after your colonoscopy.  It was a pleasure to see you today. I want to create trusting relationships with patients. If you receive a survey regarding your visit,  I greatly appreciate you taking time to fill this out on paper or through your MyChart. I value your feedback.  Charmaine Melia, MSN, FNP-BC, AGACNP-BC [facility]

## 2024-06-25 NOTE — Telephone Encounter (Signed)
" °  Request for patient to stop medication prior to procedure or is needing cleareance  06/25/2024  Gloria James 1975-08-17  What type of surgery is being performed? Colonoscopy  When is surgery scheduled? TBD  What type of clearance is required (medical or pharmacy to hold medication or both? medication  Are there any medications that need to be held prior to surgery and how long? Warfarin x 5 days or will pt need to have Lovenox  bridge  Name of physician performing surgery?  Dr.Carver [facility] [employer]: [phone], option 5 Fax: [phone]  Anesthesia type (none, local, MAC, general)? MAC   ? Yes ? No Patient can hold medication as requested   Signature: ___________________________   "

## 2024-06-25 NOTE — Telephone Encounter (Signed)
Pharmacy please advise on holding warfarin prior to colonoscopy scheduled for TBD. Thank you.

## 2024-06-25 NOTE — Telephone Encounter (Signed)
"  ° °  Patient Name: Gloria James  DOB: [birth year] MRN: [id number]  Primary Cardiologist: Alvan Carrier, MD  Clinical pharmacists have reviewed the patient's past medical history, labs, and current medications as part of preoperative protocol coverage. The following recommendations have been made:  Patient with diagnosis of MVR on warfarin for anticoagulation.     Procedure: colonoscopy Date of procedure: TBD   CrCl 119 ml/min Platelet count Not available   Patient has not had an Afib/aflutter ablation in the last 3 months, DCCV within the last 4 weeks or a watchman implanted in the last 45 days    Per office protocol, patient can hold warfarin for 5 days prior to procedure.    Patient WILL need bridging with Lovenox  (enoxaparin ) around procedure.       I will route this recommendation to the requesting party via Epic fax function and remove from pre-op pool.  Please call with questions.  Lamarr Satterfield, NP 06/25/2024, 2:53 PM  "

## 2024-06-26 ENCOUNTER — Other Ambulatory Visit: Payer: Self-pay | Admitting: *Deleted

## 2024-06-26 ENCOUNTER — Encounter: Payer: Self-pay | Admitting: *Deleted

## 2024-06-26 DIAGNOSIS — Z1211 Encounter for screening for malignant neoplasm of colon: Secondary | ICD-10-CM

## 2024-06-26 LAB — COMPREHENSIVE METABOLIC PANEL WITH GFR
ALT: 20 IU/L (ref 0–32)
AST: 18 IU/L (ref 0–40)
Albumin: 4.4 g/dL (ref 3.9–4.9)
Alkaline Phosphatase: 58 IU/L (ref 41–116)
BUN/Creatinine Ratio: 16 (ref 9–23)
BUN: 18 mg/dL (ref 6–24)
Bilirubin Total: 0.3 mg/dL (ref 0.0–1.2)
CO2: 26 mmol/L (ref 20–29)
Calcium: 10.1 mg/dL (ref 8.7–10.2)
Chloride: 103 mmol/L (ref 96–106)
Creatinine, Ser: 1.13 mg/dL — ABNORMAL HIGH (ref 0.57–1.00)
Globulin, Total: 2.6 g/dL (ref 1.5–4.5)
Glucose: 92 mg/dL (ref 70–99)
Potassium: 4.6 mmol/L (ref 3.5–5.2)
Sodium: 142 mmol/L (ref 134–144)
Total Protein: 7 g/dL (ref 6.0–8.5)
eGFR: 60 mL/min/1.73

## 2024-06-26 LAB — CBC
Hematocrit: 44.6 % (ref 34.0–46.6)
Hemoglobin: 14.4 g/dL (ref 11.1–15.9)
MCH: 29.4 pg (ref 26.6–33.0)
MCHC: 32.3 g/dL (ref 31.5–35.7)
MCV: 91 fL (ref 79–97)
Platelets: 197 x10E3/uL (ref 150–450)
RBC: 4.89 x10E6/uL (ref 3.77–5.28)
RDW: 13.2 % (ref 11.7–15.4)
WBC: 5.4 x10E3/uL (ref 3.4–10.8)

## 2024-06-26 LAB — IRON,TIBC AND FERRITIN PANEL
Ferritin: 114 ng/mL (ref 15–150)
Iron Saturation: 17 % (ref 15–55)
Iron: 57 ug/dL (ref 27–159)
Total Iron Binding Capacity: 326 ug/dL (ref 250–450)
UIBC: 269 ug/dL (ref 131–425)

## 2024-06-26 LAB — IGA: Immunoglobulin A, (IgA) QN, Serum: 275 mg/dL (ref 87–352)

## 2024-06-26 LAB — TISSUE TRANSGLUTAMINASE, IGA: t-Transglutaminase (tTG) IgA: <2 U/mL (ref 0–3)

## 2024-06-26 MED ORDER — PEG 3350-KCL-NA BICARB-NACL 420 G PO SOLR: 4000.0000 mL | Freq: Once | ORAL | 0 refills | Status: AC

## 2024-06-26 NOTE — Addendum Note (Signed)
 Addended by: GAYLENE MADELIN CROME on: 06/26/2024 11:34 AM   Modules accepted: Orders

## 2024-06-26 NOTE — Telephone Encounter (Signed)
 Pt has been scheduled for procedure on 07/17/24. Pt is aware will need Lovenox  bridge around procedure.

## 2024-06-27 NOTE — Telephone Encounter (Signed)
 INR appt changed from 07/25/24 to 07/09/24 to hold warfarin and arrange Lovenox  bridge.

## 2024-06-28 ENCOUNTER — Other Ambulatory Visit: Payer: Self-pay | Admitting: Internal Medicine

## 2024-06-28 DIAGNOSIS — K219 Gastro-esophageal reflux disease without esophagitis: Secondary | ICD-10-CM

## 2024-06-29 ENCOUNTER — Ambulatory Visit (HOSPITAL_COMMUNITY)
Admission: RE | Admit: 2024-06-29 | Discharge: 2024-06-29 | Disposition: A | Discharge status: Home / Self Care | Source: Ambulatory Visit | Attending: Gastroenterology | Admitting: Gastroenterology

## 2024-06-29 DIAGNOSIS — R1011 Right upper quadrant pain: Secondary | ICD-10-CM | POA: Diagnosis present

## 2024-06-29 DIAGNOSIS — R10A1 Flank pain, right side: Secondary | ICD-10-CM | POA: Insufficient documentation

## 2024-07-09 ENCOUNTER — Ambulatory Visit: Attending: Cardiology | Admitting: *Deleted

## 2024-07-09 ENCOUNTER — Ambulatory Visit: Payer: Self-pay | Admitting: Gastroenterology

## 2024-07-09 DIAGNOSIS — Z5181 Encounter for therapeutic drug level monitoring: Secondary | ICD-10-CM | POA: Diagnosis present

## 2024-07-09 DIAGNOSIS — Z954 Presence of other heart-valve replacement: Secondary | ICD-10-CM | POA: Insufficient documentation

## 2024-07-09 LAB — POCT INR: INR: 2.9 (ref 2.0–3.0)

## 2024-07-09 MED ORDER — ENOXAPARIN SODIUM 120 MG/0.8ML IJ SOSY: 120.0000 mg | PREFILLED_SYRINGE | Freq: Two times a day (BID) | INTRAMUSCULAR | 0 refills | Status: DC

## 2024-07-09 NOTE — Progress Notes (Signed)
 INR 2.9; Please see anticoagulation encounter

## 2024-07-09 NOTE — Patient Instructions (Addendum)
 07/17/24  Colonoscopy  Labs:  Scr 1.13  CrCl 107.53  Hgb 14.4  Hct 44.6  Plts 197  Wt 113.1kg  Lovenox  120mg  sq twice daily at 7am and 7pm  #20 syringed sent to [facility]  1/14  Last dose of warfarin 1/15  No Lovenox  or warfarin 1/16 - 1/18  Lovenox  120mg  twice daily at 7am & 7pm 1/19 Lovenox  120mg  at 7am-----No Lovenox  in PM 1/20 No Lovenox  in am-----procedure------warfarin 3mg  pm if OK with MD 1/21  Lovenox  120mg  twice daily at 7am & 7pm and warfarin 5mg  in pm 1/22   Lovenox  120mg  twice daily at 7am & 7pm and warfarin 2mg  pm 1/23   Lovenox  120mg  twice daily at 7am & 7pm and warfarin 4mg  pm 1/24   Lovenox  120mg  twice daily at 7am & 7pm and warfarin 2mg  pm 1/25   Lovenox  120mg  twice daily at 7am & 7pm and warfarin 4mg  pm 1/26  Lovenox  120mg  twice daily at 7am & 7pm and warfarin 4mg  pm 1/27   Lovenox  120mg  twice daily at 7am and warfarin 2mg  pm 1/28  INR appt at 8:15am

## 2024-07-12 ENCOUNTER — Other Ambulatory Visit: Payer: Self-pay

## 2024-07-12 ENCOUNTER — Encounter (HOSPITAL_COMMUNITY)
Admission: RE | Admit: 2024-07-12 | Discharge: 2024-07-12 | Disposition: A | Discharge status: Home / Self Care | Source: Ambulatory Visit | Attending: Internal Medicine | Admitting: Internal Medicine

## 2024-07-12 ENCOUNTER — Encounter (HOSPITAL_COMMUNITY): Payer: Self-pay

## 2024-07-12 VITALS — Ht 68.0 in | Wt 249.4 lb

## 2024-07-12 DIAGNOSIS — Z01818 Encounter for other preprocedural examination: Secondary | ICD-10-CM

## 2024-07-13 ENCOUNTER — Other Ambulatory Visit (HOSPITAL_COMMUNITY)
Admission: RE | Admit: 2024-07-13 | Discharge: 2024-07-13 | Disposition: A | Discharge status: Home / Self Care | Source: Ambulatory Visit | Attending: Internal Medicine | Admitting: Internal Medicine

## 2024-07-13 ENCOUNTER — Other Ambulatory Visit: Payer: Self-pay | Admitting: Gastroenterology

## 2024-07-13 DIAGNOSIS — Z1211 Encounter for screening for malignant neoplasm of colon: Secondary | ICD-10-CM | POA: Insufficient documentation

## 2024-07-13 DIAGNOSIS — K8050 Calculus of bile duct without cholangitis or cholecystitis without obstruction: Secondary | ICD-10-CM

## 2024-07-13 DIAGNOSIS — R10A1 Flank pain, right side: Secondary | ICD-10-CM

## 2024-07-13 DIAGNOSIS — R1011 Right upper quadrant pain: Secondary | ICD-10-CM

## 2024-07-13 LAB — PREGNANCY, URINE: Preg Test, Ur: NEGATIVE

## 2024-07-17 ENCOUNTER — Encounter (HOSPITAL_COMMUNITY): Admission: RE | Admit: 2024-07-17 | Source: Ambulatory Visit

## 2024-07-17 ENCOUNTER — Encounter (HOSPITAL_COMMUNITY): Admission: RE | Disposition: A | Payer: Self-pay | Source: Home / Self Care | Attending: Internal Medicine

## 2024-07-17 ENCOUNTER — Encounter (HOSPITAL_COMMUNITY): Payer: Self-pay | Admitting: Internal Medicine

## 2024-07-17 ENCOUNTER — Ambulatory Visit (HOSPITAL_COMMUNITY)

## 2024-07-17 ENCOUNTER — Ambulatory Visit (HOSPITAL_COMMUNITY)
Admission: RE | Admit: 2024-07-17 | Discharge: 2024-07-17 | Disposition: A | Discharge status: Home / Self Care | Attending: Internal Medicine | Admitting: Internal Medicine

## 2024-07-17 ENCOUNTER — Telehealth: Payer: Self-pay | Admitting: Cardiology

## 2024-07-17 ENCOUNTER — Other Ambulatory Visit: Payer: Self-pay | Admitting: Internal Medicine

## 2024-07-17 DIAGNOSIS — Z954 Presence of other heart-valve replacement: Secondary | ICD-10-CM | POA: Diagnosis not present

## 2024-07-17 DIAGNOSIS — I509 Heart failure, unspecified: Secondary | ICD-10-CM | POA: Diagnosis not present

## 2024-07-17 DIAGNOSIS — R1011 Right upper quadrant pain: Secondary | ICD-10-CM | POA: Insufficient documentation

## 2024-07-17 DIAGNOSIS — Z1211 Encounter for screening for malignant neoplasm of colon: Secondary | ICD-10-CM

## 2024-07-17 DIAGNOSIS — E66813 Obesity, class 3: Secondary | ICD-10-CM | POA: Insufficient documentation

## 2024-07-17 DIAGNOSIS — Z7901 Long term (current) use of anticoagulants: Secondary | ICD-10-CM | POA: Insufficient documentation

## 2024-07-17 DIAGNOSIS — D509 Iron deficiency anemia, unspecified: Secondary | ICD-10-CM | POA: Diagnosis not present

## 2024-07-17 DIAGNOSIS — Z79899 Other long term (current) drug therapy: Secondary | ICD-10-CM | POA: Diagnosis not present

## 2024-07-17 DIAGNOSIS — K580 Irritable bowel syndrome with diarrhea: Secondary | ICD-10-CM | POA: Insufficient documentation

## 2024-07-17 DIAGNOSIS — R10A1 Flank pain, right side: Secondary | ICD-10-CM | POA: Diagnosis not present

## 2024-07-17 DIAGNOSIS — F17210 Nicotine dependence, cigarettes, uncomplicated: Secondary | ICD-10-CM | POA: Insufficient documentation

## 2024-07-17 DIAGNOSIS — D122 Benign neoplasm of ascending colon: Secondary | ICD-10-CM | POA: Diagnosis not present

## 2024-07-17 DIAGNOSIS — F419 Anxiety disorder, unspecified: Secondary | ICD-10-CM | POA: Insufficient documentation

## 2024-07-17 DIAGNOSIS — F32A Depression, unspecified: Secondary | ICD-10-CM | POA: Diagnosis not present

## 2024-07-17 DIAGNOSIS — Z6837 Body mass index (BMI) 37.0-37.9, adult: Secondary | ICD-10-CM | POA: Diagnosis not present

## 2024-07-17 DIAGNOSIS — R1310 Dysphagia, unspecified: Secondary | ICD-10-CM | POA: Diagnosis not present

## 2024-07-17 DIAGNOSIS — D123 Benign neoplasm of transverse colon: Secondary | ICD-10-CM

## 2024-07-17 DIAGNOSIS — K219 Gastro-esophageal reflux disease without esophagitis: Secondary | ICD-10-CM | POA: Insufficient documentation

## 2024-07-17 DIAGNOSIS — Z975 Presence of (intrauterine) contraceptive device: Secondary | ICD-10-CM | POA: Insufficient documentation

## 2024-07-17 DIAGNOSIS — D120 Benign neoplasm of cecum: Secondary | ICD-10-CM | POA: Diagnosis not present

## 2024-07-17 DIAGNOSIS — D124 Benign neoplasm of descending colon: Secondary | ICD-10-CM | POA: Diagnosis not present

## 2024-07-17 DIAGNOSIS — Z01818 Encounter for other preprocedural examination: Secondary | ICD-10-CM

## 2024-07-17 HISTORY — PX: HEMOSTASIS CLIP PLACEMENT: SHX6857

## 2024-07-17 HISTORY — PX: COLONOSCOPY: SHX5424

## 2024-07-17 HISTORY — PX: SUBMUCOSAL INJECTION: SHX5543

## 2024-07-17 HISTORY — PX: POLYPECTOMY: SHX149

## 2024-07-17 LAB — POCT PREGNANCY, URINE: Preg Test, Ur: NEGATIVE

## 2024-07-17 LAB — HM COLONOSCOPY

## 2024-07-17 MED ORDER — LACTATED RINGERS IV SOLN: INTRAVENOUS | Status: DC

## 2024-07-17 MED ORDER — PROPOFOL 500 MG/50ML IV EMUL
INTRAVENOUS | Status: DC | PRN
  Administered 2024-07-17: 150 ug/kg/min via INTRAVENOUS
  Administered 2024-07-17: 100 mg via INTRAVENOUS

## 2024-07-17 MED ORDER — ONDANSETRON HCL 4 MG/2ML IJ SOLN
INTRAMUSCULAR | Status: AC
  Administered 2024-07-17: 4 mg
  Filled 2024-07-17: qty 2

## 2024-07-17 MED ORDER — ONDANSETRON HCL 4 MG/2ML IJ SOLN
4.0000 mg | Freq: Once | INTRAMUSCULAR | Status: AC
  Administered 2024-07-17: 4 mg via INTRAVENOUS

## 2024-07-17 MED ORDER — ONDANSETRON HCL 4 MG PO TABS: 4.0000 mg | ORAL_TABLET | Freq: Three times a day (TID) | ORAL | 0 refills | Status: AC | PRN

## 2024-07-17 MED ORDER — ONDANSETRON HCL 4 MG/2ML IJ SOLN: 4.0000 mg | Freq: Once | INTRAMUSCULAR | Status: DC

## 2024-07-17 MED ORDER — ONDANSETRON HCL 4 MG/2ML IJ SOLN
INTRAMUSCULAR | Status: AC
  Filled 2024-07-17: qty 2

## 2024-07-17 NOTE — Op Note (Signed)
 [facility] Patient Name: Gloria James Procedure Date: 07/17/2024 7:55 AM MRN: [id number] Date of Birth: 30-Jan-1976 Attending MD: Carlin POUR. Cindie , [place], [id number] CSN: [id number] Age: 49 Admit Type: Outpatient Procedure:                Colonoscopy Indications:              Screening for colorectal malignant neoplasm Providers:                Carlin POUR. Cindie, DO, Ashley Goins, Daphne Mulch                            Technician, Technician Referring MD:              Medicines:                See the Anesthesia note for documentation of the                            administered medications Complications:            No immediate complications. Estimated Blood Loss:     Estimated blood loss was minimal. Procedure:                Pre-Anesthesia Assessment:                           - The anesthesia plan was to use monitored                            anesthesia care (MAC).                           After obtaining informed consent, the colonoscope                            was passed under direct vision. Throughout the                            procedure, the patient's blood pressure, pulse, and                            oxygen saturations were monitored continuously. The                            PCF-HQ190L ([id number]) Peds Colon was introduced                            through the anus and advanced to the the cecum,                            identified by appendiceal orifice and ileocecal                            valve. The colonoscopy was performed without                            difficulty. The patient tolerated the procedure  well. The quality of the bowel preparation was                            evaluated using the BBPS [facility] Bowel Preparation                            Scale) with scores of: Right Colon = 3, Transverse                            Colon = 3 and Left Colon = 3 (entire mucosa seen                            well with no  residual staining, small fragments of                            stool or opaque liquid). The total BBPS score                            equals 9. Scope In: 8:07:21 AM Scope Out: 9:04:56 AM Scope Withdrawal Time: 0 hours 54 minutes 22 seconds  Total Procedure Duration: 0 hours 57 minutes 35 seconds  Findings:      Four sessile polyps were found in the ascending colon and cecum. The       polyps were 4 to 10 mm in size. These polyps were removed with a cold       snare. Resection and retrieval were complete.      A 20-25 mm polyp was found in the ascending colon. The polyp was       carpet-like and sessile. Preparations were made for mucosal resection.       Demarcation of the lesion was performed during the procedure to clearly       identify the boundaries of the lesion. Eleview was injected to raise the       lesion. Snare mucosal resection in piecemeal fashion was performed.       Resection and retrieval were complete. Resected tissue margins were       examined and clear of polyp tissue. To close a defect after mucosal       resection, two hemostatic clips were successfully placed (MR safe). Clip       manufacturer: [employer]. There was no bleeding at the end of the       procedure. Path bottle labeled Ascending polyp, large      A 20-25 mm polyp was found in the hepatic flexure. The polyp was       sessile. Preparations were made for mucosal resection. Demarcation of       the lesion was performed during the procedure to clearly identify the       boundaries of the lesion. Eleview was injected to raise the lesion.       Snare mucosal resection was performed in piecemeal fashion. Resection       and retrieval were complete. Resected tissue margins were examined and       clear of polyp tissue. To close a defect after mucosal resection, three       hemostatic clips were successfully placed (MR safe). Clip manufacturer:       [employer]. There was no  bleeding at the  end of the procedure.       Path bottle labeled Hepatic flexure      Four sessile polyps were found in the descending colon and transverse       colon. The polyps were 4 to 7 mm in size. These polyps were removed with       a cold snare. Resection and retrieval were complete. Impression:               - Four 4 to 10 mm polyps in the ascending colon and                            in the cecum, removed with a cold snare. Resected                            and retrieved.                           - One 20 mm polyp in the ascending colon, removed                            with mucosal resection. Resected and retrieved.                            Clip manufacturer: [employer]. Clips (MR                            safe) were placed.                           - One 20 mm polyp at the hepatic flexure, removed                            with mucosal resection. Resected and retrieved.                            Clip manufacturer: [employer]. Clips (MR                            safe) were placed.                           - Four 4 to 7 mm polyps in the descending colon and                            in the transverse colon, removed with a cold snare.                            Resected and retrieved.                           - Mucosal resection was performed. Resection and                            retrieval were complete.                           -  Mucosal resection was performed. Resection and                            retrieval were complete. Moderate Sedation:      Per Anesthesia Care Recommendation:           - Patient has a contact number available for                            emergencies. The signs and symptoms of potential                            delayed complications were discussed with the                            patient. Return to normal activities tomorrow.                            Written discharge instructions were provided to the                             patient.                           - Resume previous diet.                           - Continue present medications.                           - Await pathology results.                           - Repeat colonoscopy in 6 months for surveillance.                           - Return to GI clinic in 3 months.                           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                            Clips placed to reduce risk of bleeding, though                            given large size of polyps, does have increased                            risk of post resection bleeding. Monitor closely. Procedure Code(s):        --- Professional ---                           [phone], Colonoscopy, flexible; with endoscopic                            mucosal resection  [id number], 59, Colonoscopy, flexible; with removal of                            tumor(s), polyp(s), or other lesion(s) by snare                            technique Diagnosis Code(s):        --- Professional ---                           Z12.11, Encounter for screening for malignant                            neoplasm of colon                           D12.2, Benign neoplasm of ascending colon                           D12.0, Benign neoplasm of cecum                           D12.4, Benign neoplasm of descending colon                           D12.3, Benign neoplasm of transverse colon (hepatic                            flexure or splenic flexure) CPT copyright 2022 American Medical Association. All rights reserved. The codes documented in this report are preliminary and upon coder review may  be revised to meet current compliance requirements. Carlin POUR. Cindie, DO Carlin POUR. Cindie, DO 07/17/2024 9:13:41 AM This report has been signed electronically. Number of Addenda: 0

## 2024-07-17 NOTE — Transfer of Care (Signed)
 Immediate Anesthesia Transfer of Care Note  Patient: Gloria James  Procedure(s) Performed: COLONOSCOPY POLYPECTOMY, INTESTINE INJECTION, SUBMUCOSAL  Patient Location: Short Stay  Anesthesia Type:General  Level of Consciousness: drowsy  Airway & Oxygen Therapy: Patient Spontanous Breathing and Patient connected to nasal cannula oxygen  Post-op Assessment: Report given to RN and Post -op Vital signs reviewed and stable  Post vital signs: Reviewed and stable  Last Vitals:  Vitals Value Taken Time  BP 102/58   Temp    Pulse 89   Resp 23   SpO2 100%     Last Pain:  Vitals:   07/17/24 0803  TempSrc:   PainSc: 0-No pain         Complications: No notable events documented.

## 2024-07-17 NOTE — Telephone Encounter (Signed)
 Patient is calling to speak with you about medication and how she is suppose to take it. Please advise

## 2024-07-17 NOTE — Discharge Instructions (Addendum)
" °  Colonoscopy Discharge Instructions  Read the instructions outlined below and refer to this sheet in the next few weeks. These discharge instructions provide you with general information on caring for yourself after you leave the hospital. Your doctor may also give you specific instructions. While your treatment has been planned according to the most current medical practices available, unavoidable complications occasionally occur.   ACTIVITY You may resume your regular activity, but move at a slower pace for the next 24 hours.  Take frequent rest periods for the next 24 hours.  Walking will help get rid of the air and reduce the bloated feeling in your belly (abdomen).  No driving for 24 hours (because of the medicine (anesthesia) used during the test).   Do not sign any important legal documents or operate any machinery for 24 hours (because of the anesthesia used during the test).  NUTRITION Drink plenty of fluids.  You may resume your normal diet as instructed by your doctor.  Begin with a light meal and progress to your normal diet. Heavy or fried foods are harder to digest and may make you feel sick to your stomach (nauseated).  Avoid alcoholic beverages for 24 hours or as instructed.  MEDICATIONS You may resume your normal medications unless your doctor tells you otherwise.  WHAT YOU CAN EXPECT TODAY Some feelings of bloating in the abdomen.  Passage of more gas than usual.  Spotting of blood in your stool or on the toilet paper.  IF YOU HAD POLYPS REMOVED DURING THE COLONOSCOPY: No aspirin  products for 7 days or as instructed.  No alcohol for 7 days or as instructed.  Eat a soft diet for the next 24 hours.  FINDING OUT THE RESULTS OF YOUR TEST Not all test results are available during your visit. If your test results are not back during the visit, make an appointment with your caregiver to find out the results. Do not assume everything is normal if you have not heard from your  caregiver or the medical facility. It is important for you to follow up on all of your test results.  SEEK IMMEDIATE MEDICAL ATTENTION IF: You have more than a spotting of blood in your stool.  Your belly is swollen (abdominal distention).  You are nauseated or vomiting.  You have a temperature over 101.  You have abdominal pain or discomfort that is severe or gets worse throughout the day.   Your colonoscopy revealed 10 polyp(s) which I removed successfully. 2 were quite large requiring in depth resection. Metallic clips were placed on both resection sites after removal to reduce risk of post resection bleeding. These will naturally fall off over the next few months. Await pathology results, my office will contact you. I recommend repeating colonoscopy in 6 months for surveillance purposes, depending on pathology results.  Otherwise follow up with GI in 3 months   I hope you have a great rest of your week!  Carlin POUR. Cindie, D.O. [facility]  "

## 2024-07-17 NOTE — Anesthesia Preprocedure Evaluation (Addendum)
"                                    Anesthesia Evaluation  Patient identified by MRN, date of birth, ID band Patient awake    Reviewed: Allergy & Precautions, H&P , NPO status , Patient's Chart, lab work & pertinent test results  History of Anesthesia Complications (+) PONV and history of anesthetic complications  Airway Mallampati: II  TM Distance: >3 FB Neck ROM: Full    Dental  (+) Edentulous Lower, Edentulous Upper   Pulmonary shortness of breath, Current Smoker   Pulmonary exam normal breath sounds clear to auscultation       Cardiovascular +CHF  Normal cardiovascular exam+ dysrhythmias  Rhythm:Regular Rate:Normal  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%   Neuro/Psych  PSYCHIATRIC DISORDERS Anxiety Depression    negative neurological ROS     GI/Hepatic negative GI ROS, Neg liver ROS,,,  Endo/Other    Class 3 obesity  Renal/GU Renal disease  negative genitourinary   Musculoskeletal negative musculoskeletal ROS (+)    Abdominal   Peds negative pediatric ROS (+)  Hematology  (+) Blood dyscrasia, anemia   Anesthesia Other Findings   Reproductive/Obstetrics negative OB ROS                              Anesthesia Physical Anesthesia Plan  ASA: 3  Anesthesia Plan: General   Post-op Pain Management:    Induction: Intravenous  PONV Risk Score and Plan:   Airway Management Planned: Nasal Cannula and Natural Airway  Additional Equipment:   Intra-op Plan:   Post-operative Plan:   Informed Consent: I have reviewed the patients History and Physical, chart, labs and discussed the procedure including the risks, benefits and alternatives for the proposed anesthesia with the patient or authorized representative who has indicated his/her understanding and acceptance.     Dental advisory given  Plan Discussed with: CRNA  Anesthesia Plan Comments:          Anesthesia Quick Evaluation  "

## 2024-07-17 NOTE — Interval H&P Note (Signed)
 History and Physical Interval Note:  07/17/2024 7:57 AM  Gloria James  has presented today for surgery, with the diagnosis of screening.  The various methods of treatment have been discussed with the patient and family. After consideration of risks, benefits and other options for treatment, the patient has consented to  Procedures with comments: COLONOSCOPY (N/A) - 8:00 am, asa 3 as a surgical intervention.  The patient's history has been reviewed, patient examined, no change in status, stable for surgery.  I have reviewed the patient's chart and labs.  Questions were answered to the patient's satisfaction.     Carlin MARLA Hasty

## 2024-07-17 NOTE — Anesthesia Postprocedure Evaluation (Signed)
"   Anesthesia Post Note  Patient: FELIZA DIVEN  Procedure(s) Performed: COLONOSCOPY POLYPECTOMY, INTESTINE INJECTION, SUBMUCOSAL CONTROL OF HEMORRHAGE, GI TRACT, ENDOSCOPIC, BY CLIPPING OR OVERSEWING  Patient location during evaluation: PACU Anesthesia Type: General Level of consciousness: awake and alert Pain management: pain level controlled Vital Signs Assessment: post-procedure vital signs reviewed and stable Respiratory status: spontaneous breathing, nonlabored ventilation, respiratory function stable and patient connected to nasal cannula oxygen Cardiovascular status: blood pressure returned to baseline and stable Postop Assessment: no apparent nausea or vomiting Anesthetic complications: no   No notable events documented.   Last Vitals:  Vitals:   07/17/24 0922 07/17/24 0936  BP: 100/75 (!) 121/98  Pulse:    Resp:    Temp:    SpO2:      Last Pain:  Vitals:   07/17/24 0922  TempSrc:   PainSc: 8                  Andrea Limes      "

## 2024-07-18 ENCOUNTER — Encounter (HOSPITAL_COMMUNITY): Payer: Self-pay | Admitting: Internal Medicine

## 2024-07-18 ENCOUNTER — Encounter (INDEPENDENT_AMBULATORY_CARE_PROVIDER_SITE_OTHER): Payer: Self-pay | Admitting: *Deleted

## 2024-07-18 NOTE — Telephone Encounter (Signed)
 Spoke with pt.  Pt had colonoscopy yesterday.  Several large polyps removed and clipped.  Pt said nurse at discharge told her not to restart warfarin till next Wednesday.  Sent message to Dr. Cindie to confirm.  He stated that is incorrect and pt could restart warfarin immediately.  Told pt to restart warfarin tonight and follow warfarin/lovenox  schedule.  She verbalized understanding.

## 2024-07-19 LAB — SURGICAL PATHOLOGY

## 2024-07-20 ENCOUNTER — Ambulatory Visit: Payer: Self-pay | Admitting: Internal Medicine

## 2024-07-25 ENCOUNTER — Ambulatory Visit: Attending: Cardiology | Admitting: *Deleted

## 2024-07-25 ENCOUNTER — Ambulatory Visit

## 2024-07-25 ENCOUNTER — Telehealth: Payer: Self-pay | Admitting: Cardiology

## 2024-07-25 DIAGNOSIS — Z954 Presence of other heart-valve replacement: Secondary | ICD-10-CM | POA: Insufficient documentation

## 2024-07-25 DIAGNOSIS — Z5181 Encounter for therapeutic drug level monitoring: Secondary | ICD-10-CM | POA: Diagnosis present

## 2024-07-25 LAB — POCT INR: INR: 1.2 — AB (ref 2.0–3.0)

## 2024-07-25 MED ORDER — ENOXAPARIN SODIUM 120 MG/0.8ML IJ SOSY: 120.0000 mg | PREFILLED_SYRINGE | Freq: Two times a day (BID) | INTRAMUSCULAR | 0 refills | Status: DC

## 2024-07-25 NOTE — Telephone Encounter (Signed)
" °*  STAT* If patient is at the pharmacy, call can be transferred to refill team.   1. Which medications need to be refilled? (please list name of each medication and dose if known) enoxaparin  (LOVENOX ) 120 MG/0.8ML injection      4. Which pharmacy/location (including street and city if local pharmacy) is medication to be sent to?  [facility] - [place], [place] - [address]     5. Do they need a 30 day or 90 day supply? 90   "

## 2024-07-25 NOTE — Progress Notes (Signed)
 INR 1.2

## 2024-07-25 NOTE — Patient Instructions (Addendum)
 Take warfarin 2 1/2 tablets tonight then take take 2 tablets daily and recheck on Monday. Continue lovenox  bridge. Recheck INR 07/30/24

## 2024-07-25 NOTE — Telephone Encounter (Signed)
 Refill request for Lovenox .  Refill approved.

## 2024-07-30 ENCOUNTER — Ambulatory Visit

## 2024-07-31 ENCOUNTER — Ambulatory Visit

## 2024-07-31 DIAGNOSIS — Z954 Presence of other heart-valve replacement: Secondary | ICD-10-CM

## 2024-07-31 DIAGNOSIS — Z5181 Encounter for therapeutic drug level monitoring: Secondary | ICD-10-CM

## 2024-07-31 LAB — POCT INR: INR: 1.9 — AB (ref 2.0–3.0)

## 2024-07-31 MED ORDER — ENOXAPARIN SODIUM 120 MG/0.8ML IJ SOSY: 120.0000 mg | PREFILLED_SYRINGE | Freq: Two times a day (BID) | INTRAMUSCULAR | 0 refills | Status: AC

## 2024-07-31 NOTE — Progress Notes (Signed)
 INR 1.9

## 2024-08-07 ENCOUNTER — Ambulatory Visit

## 2024-10-15 ENCOUNTER — Ambulatory Visit: Admitting: Gastroenterology
